# Patient Record
Sex: Female | Born: 1935
Health system: Southern US, Community
[De-identification: ages and names within clinical notes are randomized; demographics above are authoritative.]

## PROBLEM LIST (undated history)

## (undated) DIAGNOSIS — K219 Gastro-esophageal reflux disease without esophagitis: Secondary | ICD-10-CM

## (undated) DIAGNOSIS — M26629 Arthralgia of temporomandibular joint, unspecified side: Secondary | ICD-10-CM

## (undated) DIAGNOSIS — I10 Essential (primary) hypertension: Secondary | ICD-10-CM

## (undated) DIAGNOSIS — H269 Unspecified cataract: Secondary | ICD-10-CM

## (undated) DIAGNOSIS — C801 Malignant (primary) neoplasm, unspecified: Secondary | ICD-10-CM

## (undated) DIAGNOSIS — G8929 Other chronic pain: Secondary | ICD-10-CM

## (undated) DIAGNOSIS — Z923 Personal history of irradiation: Secondary | ICD-10-CM

## (undated) DIAGNOSIS — M1990 Unspecified osteoarthritis, unspecified site: Secondary | ICD-10-CM

## (undated) DIAGNOSIS — R51 Headache: Secondary | ICD-10-CM

## (undated) DIAGNOSIS — R519 Headache, unspecified: Secondary | ICD-10-CM

## (undated) DIAGNOSIS — H04123 Dry eye syndrome of bilateral lacrimal glands: Secondary | ICD-10-CM

## (undated) HISTORY — PX: CATARACT EXTRACTION: SUR2

---

## 2004-03-06 ENCOUNTER — Emergency Department (HOSPITAL_COMMUNITY): Admission: EM | Admit: 2004-03-06 | Discharge: 2004-03-06 | Payer: Self-pay | Admitting: Family Medicine

## 2004-04-23 ENCOUNTER — Emergency Department (HOSPITAL_COMMUNITY): Admission: EM | Admit: 2004-04-23 | Discharge: 2004-04-23 | Payer: Self-pay | Admitting: Family Medicine

## 2004-05-03 ENCOUNTER — Emergency Department (HOSPITAL_COMMUNITY): Admission: EM | Admit: 2004-05-03 | Discharge: 2004-05-03 | Payer: Self-pay | Admitting: *Deleted

## 2004-07-06 ENCOUNTER — Emergency Department (HOSPITAL_COMMUNITY): Admission: EM | Admit: 2004-07-06 | Discharge: 2004-07-06 | Payer: Self-pay | Admitting: Family Medicine

## 2004-09-26 ENCOUNTER — Emergency Department (HOSPITAL_COMMUNITY): Admission: EM | Admit: 2004-09-26 | Discharge: 2004-09-26 | Payer: Self-pay | Admitting: Family Medicine

## 2005-01-31 ENCOUNTER — Emergency Department (HOSPITAL_COMMUNITY): Admission: EM | Admit: 2005-01-31 | Discharge: 2005-01-31 | Payer: Self-pay | Admitting: Emergency Medicine

## 2005-02-08 ENCOUNTER — Emergency Department (HOSPITAL_COMMUNITY): Admission: EM | Admit: 2005-02-08 | Discharge: 2005-02-08 | Payer: Self-pay | Admitting: Emergency Medicine

## 2005-06-06 ENCOUNTER — Emergency Department (HOSPITAL_COMMUNITY): Admission: EM | Admit: 2005-06-06 | Discharge: 2005-06-06 | Payer: Self-pay | Admitting: Family Medicine

## 2005-09-28 ENCOUNTER — Encounter: Admission: RE | Admit: 2005-09-28 | Discharge: 2005-09-28 | Payer: Self-pay | Admitting: Internal Medicine

## 2005-10-11 ENCOUNTER — Encounter: Admission: RE | Admit: 2005-10-11 | Discharge: 2005-10-11 | Payer: Self-pay | Admitting: Internal Medicine

## 2005-11-03 ENCOUNTER — Other Ambulatory Visit: Admission: RE | Admit: 2005-11-03 | Discharge: 2005-11-03 | Payer: Self-pay | Admitting: Otolaryngology

## 2006-05-20 ENCOUNTER — Emergency Department (HOSPITAL_COMMUNITY): Admission: EM | Admit: 2006-05-20 | Discharge: 2006-05-20 | Payer: Self-pay | Admitting: Family Medicine

## 2006-06-05 ENCOUNTER — Emergency Department (HOSPITAL_COMMUNITY): Admission: EM | Admit: 2006-06-05 | Discharge: 2006-06-05 | Payer: Self-pay | Admitting: Family Medicine

## 2006-09-01 ENCOUNTER — Emergency Department (HOSPITAL_COMMUNITY): Admission: EM | Admit: 2006-09-01 | Discharge: 2006-09-01 | Payer: Self-pay | Admitting: Family Medicine

## 2006-10-03 ENCOUNTER — Encounter: Admission: RE | Admit: 2006-10-03 | Discharge: 2006-10-03 | Payer: Self-pay | Admitting: Internal Medicine

## 2007-02-19 ENCOUNTER — Emergency Department (HOSPITAL_COMMUNITY): Admission: EM | Admit: 2007-02-19 | Discharge: 2007-02-19 | Payer: Self-pay | Admitting: Family Medicine

## 2007-07-06 ENCOUNTER — Ambulatory Visit: Payer: Self-pay | Admitting: Internal Medicine

## 2007-07-29 ENCOUNTER — Emergency Department (HOSPITAL_COMMUNITY): Admission: EM | Admit: 2007-07-29 | Discharge: 2007-07-29 | Payer: Self-pay | Admitting: Emergency Medicine

## 2007-08-31 ENCOUNTER — Ambulatory Visit: Payer: Self-pay | Admitting: Internal Medicine

## 2007-09-14 ENCOUNTER — Ambulatory Visit: Payer: Self-pay | Admitting: Internal Medicine

## 2007-09-14 DIAGNOSIS — K5730 Diverticulosis of large intestine without perforation or abscess without bleeding: Secondary | ICD-10-CM | POA: Insufficient documentation

## 2007-09-14 DIAGNOSIS — K648 Other hemorrhoids: Secondary | ICD-10-CM | POA: Insufficient documentation

## 2007-10-04 DIAGNOSIS — M1990 Unspecified osteoarthritis, unspecified site: Secondary | ICD-10-CM | POA: Insufficient documentation

## 2007-10-20 ENCOUNTER — Emergency Department (HOSPITAL_COMMUNITY): Admission: EM | Admit: 2007-10-20 | Discharge: 2007-10-20 | Payer: Self-pay | Admitting: Family Medicine

## 2007-10-26 ENCOUNTER — Encounter: Admission: RE | Admit: 2007-10-26 | Discharge: 2007-10-26 | Payer: Self-pay | Admitting: Internal Medicine

## 2007-11-09 ENCOUNTER — Encounter: Admission: RE | Admit: 2007-11-09 | Discharge: 2007-11-09 | Payer: Self-pay | Admitting: Internal Medicine

## 2008-03-12 ENCOUNTER — Emergency Department (HOSPITAL_COMMUNITY): Admission: EM | Admit: 2008-03-12 | Discharge: 2008-03-13 | Payer: Self-pay | Admitting: Emergency Medicine

## 2008-11-07 ENCOUNTER — Encounter: Admission: RE | Admit: 2008-11-07 | Discharge: 2008-11-07 | Payer: Self-pay | Admitting: Internal Medicine

## 2008-12-22 ENCOUNTER — Emergency Department (HOSPITAL_COMMUNITY): Admission: EM | Admit: 2008-12-22 | Discharge: 2008-12-22 | Payer: Self-pay | Admitting: Emergency Medicine

## 2009-06-08 ENCOUNTER — Emergency Department (HOSPITAL_COMMUNITY): Admission: EM | Admit: 2009-06-08 | Discharge: 2009-06-08 | Payer: Self-pay | Admitting: Family Medicine

## 2009-09-09 ENCOUNTER — Emergency Department (HOSPITAL_COMMUNITY): Admission: EM | Admit: 2009-09-09 | Discharge: 2009-09-09 | Payer: Self-pay | Admitting: Emergency Medicine

## 2009-09-15 ENCOUNTER — Emergency Department (HOSPITAL_COMMUNITY): Admission: EM | Admit: 2009-09-15 | Discharge: 2009-09-15 | Payer: Self-pay | Admitting: Emergency Medicine

## 2009-10-02 ENCOUNTER — Encounter: Admission: RE | Admit: 2009-10-02 | Discharge: 2009-10-02 | Payer: Self-pay | Admitting: Internal Medicine

## 2009-10-02 ENCOUNTER — Emergency Department (HOSPITAL_COMMUNITY): Admission: EM | Admit: 2009-10-02 | Discharge: 2009-10-02 | Payer: Self-pay | Admitting: Emergency Medicine

## 2009-10-31 ENCOUNTER — Emergency Department (HOSPITAL_COMMUNITY): Admission: EM | Admit: 2009-10-31 | Discharge: 2009-11-01 | Payer: Self-pay | Admitting: Emergency Medicine

## 2009-11-14 ENCOUNTER — Emergency Department (HOSPITAL_COMMUNITY): Admission: EM | Admit: 2009-11-14 | Discharge: 2009-11-14 | Payer: Self-pay | Admitting: Family Medicine

## 2009-12-11 ENCOUNTER — Encounter: Admission: RE | Admit: 2009-12-11 | Discharge: 2009-12-11 | Payer: Self-pay | Admitting: Internal Medicine

## 2010-05-25 ENCOUNTER — Emergency Department (HOSPITAL_COMMUNITY): Admission: EM | Admit: 2010-05-25 | Discharge: 2010-05-25 | Payer: Self-pay | Admitting: Family Medicine

## 2010-07-26 ENCOUNTER — Emergency Department (HOSPITAL_COMMUNITY)
Admission: EM | Admit: 2010-07-26 | Discharge: 2010-07-26 | Payer: Self-pay | Source: Home / Self Care | Admitting: Emergency Medicine

## 2010-08-22 HISTORY — PX: BREAST BIOPSY: SHX20

## 2010-11-07 LAB — CBC
HCT: 38.8 % (ref 36.0–46.0)
Hemoglobin: 13.1 g/dL (ref 12.0–15.0)
RBC: 4.43 MIL/uL (ref 3.87–5.11)
RDW: 14.1 % (ref 11.5–15.5)
WBC: 4.5 10*3/uL (ref 4.0–10.5)

## 2010-11-07 LAB — COMPREHENSIVE METABOLIC PANEL
ALT: 16 U/L (ref 0–35)
Alkaline Phosphatase: 54 U/L (ref 39–117)
BUN: 17 mg/dL (ref 6–23)
CO2: 29 mEq/L (ref 19–32)
Chloride: 99 mEq/L (ref 96–112)
GFR calc non Af Amer: >60 mL/min (ref >60)
Glucose, Bld: 133 mg/dL — ABNORMAL HIGH (ref 70–99)
Potassium: 3.6 mEq/L (ref 3.5–5.1)
Sodium: 137 mEq/L (ref 135–145)
Total Bilirubin: 0.3 mg/dL (ref 0.3–1.2)

## 2010-11-07 LAB — DIFFERENTIAL
Basophils Absolute: 0 10*3/uL (ref 0.0–0.1)
Basophils Relative: 0 % (ref 0–1)
Eosinophils Absolute: 0 10*3/uL (ref 0.0–0.7)
Neutro Abs: 2.5 10*3/uL (ref 1.7–7.7)
Neutrophils Relative %: 56 % (ref 43–77)

## 2010-11-14 LAB — CBC
HCT: 38.3 % (ref 36.0–46.0)
Hemoglobin: 12.9 g/dL (ref 12.0–15.0)
Platelets: 267 10*3/uL (ref 150–400)
RDW: 15.7 % — ABNORMAL HIGH (ref 11.5–15.5)
WBC: 5.5 10*3/uL (ref 4.0–10.5)

## 2010-11-14 LAB — POCT URINALYSIS DIP (DEVICE)
Bilirubin Urine: NEGATIVE
Ketones, ur: NEGATIVE mg/dL
Nitrite: NEGATIVE
Protein, ur: NEGATIVE mg/dL
pH: 7 (ref 5.0–8.0)

## 2010-11-14 LAB — COMPREHENSIVE METABOLIC PANEL
ALT: 15 U/L (ref 0–35)
Albumin: 3.7 g/dL (ref 3.5–5.2)
Alkaline Phosphatase: 52 U/L (ref 39–117)
BUN: 11 mg/dL (ref 6–23)
Chloride: 106 mEq/L (ref 96–112)
Potassium: 3.8 mEq/L (ref 3.5–5.1)
Sodium: 138 mEq/L (ref 135–145)
Total Bilirubin: 0.6 mg/dL (ref 0.3–1.2)
Total Protein: 7.2 g/dL (ref 6.0–8.3)

## 2010-11-14 LAB — DIFFERENTIAL
Basophils Absolute: 0 10*3/uL (ref 0.0–0.1)
Basophils Relative: 0 % (ref 0–1)
Eosinophils Absolute: 0.1 10*3/uL (ref 0.0–0.7)
Eosinophils Relative: 2 % (ref 0–5)
Monocytes Absolute: 0.3 10*3/uL (ref 0.1–1.0)
Monocytes Relative: 6 % (ref 3–12)

## 2010-11-25 LAB — URINE CULTURE: Colony Count: 35000

## 2010-11-25 LAB — POCT URINALYSIS DIP (DEVICE)
Bilirubin Urine: NEGATIVE
Glucose, UA: NEGATIVE mg/dL
Ketones, ur: NEGATIVE mg/dL
Nitrite: NEGATIVE
Specific Gravity, Urine: 1.015 (ref 1.005–1.030)
pH: 5.5 (ref 5.0–8.0)

## 2010-12-01 ENCOUNTER — Ambulatory Visit (INDEPENDENT_AMBULATORY_CARE_PROVIDER_SITE_OTHER): Payer: Medicare Other

## 2010-12-01 ENCOUNTER — Inpatient Hospital Stay (INDEPENDENT_AMBULATORY_CARE_PROVIDER_SITE_OTHER)
Admission: RE | Admit: 2010-12-01 | Discharge: 2010-12-01 | Disposition: A | Discharge status: Home / Self Care | Payer: Medicare Other | Source: Ambulatory Visit | Attending: Emergency Medicine | Admitting: Emergency Medicine

## 2010-12-01 DIAGNOSIS — M1990 Unspecified osteoarthritis, unspecified site: Secondary | ICD-10-CM

## 2010-12-08 ENCOUNTER — Other Ambulatory Visit: Payer: Self-pay | Admitting: Internal Medicine

## 2010-12-08 DIAGNOSIS — Z1231 Encounter for screening mammogram for malignant neoplasm of breast: Secondary | ICD-10-CM

## 2010-12-15 ENCOUNTER — Ambulatory Visit
Admission: RE | Admit: 2010-12-15 | Discharge: 2010-12-15 | Disposition: A | Discharge status: Home / Self Care | Payer: MEDICARE | Source: Ambulatory Visit | Attending: Internal Medicine | Admitting: Internal Medicine

## 2010-12-15 ENCOUNTER — Other Ambulatory Visit: Payer: Self-pay | Admitting: Internal Medicine

## 2010-12-15 DIAGNOSIS — Z1231 Encounter for screening mammogram for malignant neoplasm of breast: Secondary | ICD-10-CM

## 2010-12-15 DIAGNOSIS — R928 Other abnormal and inconclusive findings on diagnostic imaging of breast: Secondary | ICD-10-CM

## 2010-12-22 ENCOUNTER — Ambulatory Visit
Admission: RE | Admit: 2010-12-22 | Discharge: 2010-12-22 | Disposition: A | Discharge status: Home / Self Care | Payer: MEDICARE | Source: Ambulatory Visit | Attending: Internal Medicine | Admitting: Internal Medicine

## 2010-12-22 ENCOUNTER — Other Ambulatory Visit: Payer: Self-pay | Admitting: Internal Medicine

## 2010-12-22 DIAGNOSIS — R928 Other abnormal and inconclusive findings on diagnostic imaging of breast: Secondary | ICD-10-CM

## 2010-12-30 ENCOUNTER — Ambulatory Visit
Admission: RE | Admit: 2010-12-30 | Discharge: 2010-12-30 | Disposition: A | Discharge status: Home / Self Care | Payer: MEDICARE | Source: Ambulatory Visit | Attending: Internal Medicine | Admitting: Internal Medicine

## 2010-12-30 ENCOUNTER — Other Ambulatory Visit: Payer: Self-pay | Admitting: Diagnostic Radiology

## 2010-12-30 DIAGNOSIS — R928 Other abnormal and inconclusive findings on diagnostic imaging of breast: Secondary | ICD-10-CM

## 2011-01-04 NOTE — Assessment & Plan Note (Signed)
[facility]                         GASTROENTEROLOGY OFFICE NOTE   NAME:Dawn Rodriguez, Dawn Rodriguez                        MRN:          [id number]  DATE:07/06/2007                            DOB:          March 02, 1936    CHIEF COMPLAINT:  Self-referred regarding bleeding.   HISTORY:  This is a 75 year old African-American woman (I performed a  colonoscopy on her brother) who started experiencing some changing bowel  habits a month or so ago.  She was having 2-3 days of hard, skivolous  balls of stool and then would have a normal bowel movement, then one day  a couple of weeks ago, she had clots of bright red blood and darker  blood in the commode.  That really only happened once.  Her bowel habits  are a little bit better but still changing back and forth between hard  and normal.  She has had some epigastric pain.  There has been no melena  reported.  She has had hemorrhoids for 40 years but says she has never  had any bleeding.  Her elderly mother had colon cancer diagnosed at 60,  had that removed surgically, but then died of pneumonia.  Patient  herself had a colonoscopy at [facility] in [place].  I see  photos she brings that showed diverticulosis and her hemorrhoids.  She  was told to have one every five years because of the family history.   MEDICATIONS:  No regular medications.  Motrin Arthritis and garlic  tablet p.r.n.   DRUG ALLERGIES:  None known.   PAST MEDICAL HISTORY:  Diverticulosis and osteoarthritis, otherwise  negative.   FAMILY HISTORY:  Notable for father having prostate cancer and renal  failure.  Her father also has a colostomy, for some reason.   SOCIAL HISTORY:  She is divorced.  She is a Building surveyor.  Two sons.  She has a Statistician.   Arthritis pain, some headaches, otherwise review of systems is negative.   PHYSICAL EXAMINATION:  A well-developed elderly black woman looking  younger than stated age.  Height 5 feet 6.  Weight 156 pounds.  Blood pressure 134/80.  Pulse 62.  Eyes are anicteric.  Mouth shows dentures.  NECK:  Supple.  CHEST:  Clear.  HEART:  S1 and S2.  No murmurs, rubs or gallops.  ABDOMEN:  Soft and nontender, no organomegaly or mass.  The lower extremities are free of edema.  She is alert and oriented x3.  LYMPH NODES:  No neck or supraclavicular nodes.   ASSESSMENT:  Change in bowel habits with blood in stool.  Family history  of colon cancer, although it was an elderly person.   PLAN:  1. Schedule colonoscopy and investigate.  2. Risks, benefits and indications are reviewed.  She understands and      agrees to proceed.  3. If this colonoscopy is unrevealing, would try fiber      supplementation, most likely.  She would probably need another      colonoscopy in about five years.  She is getting older, and it  would be reasonable, since her mother got it at an older age, to do      that, I suppose.  One could make an argument on going longer on the      intervals.  She is inclined to do it in five years, and I think      that is reasonable.     Iva Boop, MD,FACG  Electronically Signed    CEG/MedQ  DD: 07/06/2007  DT: 07/07/2007  Job #: [id number]   cc:   Merlene Laughter. Renae Gloss, M.D.

## 2011-03-02 ENCOUNTER — Inpatient Hospital Stay (INDEPENDENT_AMBULATORY_CARE_PROVIDER_SITE_OTHER)
Admission: RE | Admit: 2011-03-02 | Discharge: 2011-03-02 | Disposition: A | Discharge status: Home / Self Care | Payer: Medicare Other | Source: Ambulatory Visit | Attending: Family Medicine | Admitting: Family Medicine

## 2011-03-02 DIAGNOSIS — I10 Essential (primary) hypertension: Secondary | ICD-10-CM

## 2011-03-02 DIAGNOSIS — F411 Generalized anxiety disorder: Secondary | ICD-10-CM

## 2011-05-20 LAB — DIFFERENTIAL
Basophils Absolute: 0
Basophils Relative: 0
Eosinophils Absolute: 0.2
Eosinophils Relative: 3
Monocytes Absolute: 0.5
Neutro Abs: 4.5

## 2011-05-20 LAB — POCT I-STAT, CHEM 8
Calcium, Ion: 1.19
Chloride: 106
HCT: 36
Hemoglobin: 12.2
TCO2: 28

## 2011-05-20 LAB — POCT CARDIAC MARKERS: Operator id: 277751

## 2011-05-20 LAB — CBC
Hemoglobin: 12
MCHC: 33.2
MCV: 90.6
RDW: 15.1

## 2011-06-28 ENCOUNTER — Other Ambulatory Visit: Payer: Self-pay | Admitting: Internal Medicine

## 2011-06-28 DIAGNOSIS — R921 Mammographic calcification found on diagnostic imaging of breast: Secondary | ICD-10-CM

## 2011-07-12 ENCOUNTER — Ambulatory Visit
Admission: RE | Admit: 2011-07-12 | Discharge: 2011-07-12 | Disposition: A | Discharge status: Home / Self Care | Payer: Medicare Other | Source: Ambulatory Visit | Attending: Internal Medicine | Admitting: Internal Medicine

## 2011-07-12 DIAGNOSIS — R921 Mammographic calcification found on diagnostic imaging of breast: Secondary | ICD-10-CM

## 2011-10-30 ENCOUNTER — Encounter (HOSPITAL_COMMUNITY): Payer: Self-pay | Admitting: *Deleted

## 2011-10-30 ENCOUNTER — Emergency Department (HOSPITAL_COMMUNITY)
Admission: EM | Admit: 2011-10-30 | Discharge: 2011-10-30 | Disposition: A | Discharge status: Home / Self Care | Payer: Medicare Other | Source: Home / Self Care | Attending: Emergency Medicine | Admitting: Emergency Medicine

## 2011-10-30 DIAGNOSIS — N3000 Acute cystitis without hematuria: Secondary | ICD-10-CM

## 2011-10-30 HISTORY — DX: Essential (primary) hypertension: I10

## 2011-10-30 LAB — POCT URINALYSIS DIP (DEVICE)
Protein, ur: >=300 mg/dL — AB
Urobilinogen, UA: 1 mg/dL (ref 0.0–1.0)
pH: 5 (ref 5.0–8.0)

## 2011-10-30 MED ORDER — CEPHALEXIN 500 MG PO CAPS: 500.0000 mg | ORAL_CAPSULE | Freq: Three times a day (TID) | ORAL | Status: AC

## 2011-10-30 MED ORDER — ACETAMINOPHEN 500 MG PO TABS
1000.0000 mg | ORAL_TABLET | Freq: Four times a day (QID) | ORAL | Status: DC | PRN
  Administered 2011-10-30: 1000 mg via ORAL

## 2011-10-30 NOTE — ED Notes (Signed)
CO ABD PAIN WITH FREQUENT URINATION X 1 DAY

## 2011-10-30 NOTE — Discharge Instructions (Signed)

## 2011-10-30 NOTE — ED Provider Notes (Signed)
Chief Complaint  Patient presents with  . Urinary Frequency    History of Present Illness:   Dawn Rodriguez is 76 year old female who has had a one-day history of dysuria, frequency, lower back, and lower abdominal pain. She denies fever, chills, nausea, vomiting, hematuria, or GYN complaints. She has had urinary tract infections before, most recently about one year ago.  Review of Systems:  Other than noted above, the patient denies any of the following symptoms: General:  No fevers, chills, sweats, aches, or fatigue. GI:  No abdominal pain, back pain, nausea, vomiting, diarrhea, or constipation. GU:  No dysuria, frequency, urgency, hematuria, or incontinence. GYN:  No discharge, itching, vulvar pain or lesions, pelvic pain, or abnormal vaginal bleeding.  PMFSH:  Past medical history, family history, social history, meds, and allergies were reviewed.  Physical Exam:   Vital signs:  BP 137/76  Pulse 79  Temp(Src) 98.6 F (37 C) (Oral)  Resp 18  SpO2 98% Gen:  Alert, oriented, in no distress. Lungs:  Clear to auscultation, no wheezes, rales or rhonchi. Heart:  Regular rhythm, no gallop or murmer. Abdomen:  Flat and soft. There was slight suprapubic pain to palpation.  No guarding, or rebound.  No hepato-splenomegaly or mass.  Bowel sounds were normally active.  No hernia. Back:  No CVA tenderness.  Skin:  Clear, warm and dry.  Labs:   Results for orders placed during the hospital encounter of 10/30/11  POCT URINALYSIS DIP (DEVICE)      Component Value Range   Glucose, UA NEGATIVE  NEGATIVE (mg/dL)   Bilirubin Urine NEGATIVE  NEGATIVE    Ketones, ur TRACE (*) NEGATIVE (mg/dL)   Specific Gravity, Urine 1.015  1.005 - 1.030    Hgb urine dipstick LARGE (*) NEGATIVE    pH 5.0  5.0 - 8.0    Protein, ur >=300 (*) NEGATIVE (mg/dL)   Urobilinogen, UA 1.0  0.0 - 1.0 (mg/dL)   Nitrite POSITIVE (*) NEGATIVE    Leukocytes, UA MODERATE (*) NEGATIVE     A urine culture was  obtained.  Assessment:  Diagnoses that have been ruled out:  None  Diagnoses that are still under consideration:  None  Final diagnoses:  Acute cystitis     Plan:   1.  The following meds were prescribed:   New Prescriptions   CEPHALEXIN (KEFLEX) 500 MG CAPSULE    Take 1 capsule (500 mg total) by mouth 3 (three) times daily.   2.  The patient was instructed in symptomatic care and handouts were given. 3.  The patient was told to return if becoming worse in any way, if no better in 3 or 4 days, and given some red flag symptoms that would indicate earlier return. 4.  The patient was told to avoid intercourse for 10 days, get extra fluids, and return for a follow up with her primary care doctor at the completion of treatment for a repeat UA and culture.     Reuben Likes, MD 10/30/11 [phone]

## 2011-11-01 LAB — URINE CULTURE
Colony Count: >=100000
Culture  Setup Time: 201303110230
Special Requests: NORMAL

## 2011-11-02 NOTE — ED Notes (Signed)
Urine culture: >100,000 colonies E. Coli. Pt. adequately treated with Keflex. Vassie Moselle 11/02/2011

## 2012-04-15 ENCOUNTER — Emergency Department (INDEPENDENT_AMBULATORY_CARE_PROVIDER_SITE_OTHER)
Admission: EM | Admit: 2012-04-15 | Discharge: 2012-04-15 | Disposition: A | Discharge status: Home / Self Care | Payer: Medicare Other | Source: Home / Self Care | Attending: Emergency Medicine | Admitting: Emergency Medicine

## 2012-04-15 ENCOUNTER — Encounter (HOSPITAL_COMMUNITY): Payer: Self-pay | Admitting: *Deleted

## 2012-04-15 DIAGNOSIS — J029 Acute pharyngitis, unspecified: Secondary | ICD-10-CM

## 2012-04-15 LAB — POCT RAPID STREP A: Streptococcus, Group A Screen (Direct): NEGATIVE

## 2012-04-15 MED ORDER — PREDNISONE 5 MG PO KIT: 1.0000 | PACK | Freq: Every day | ORAL | Status: DC

## 2012-04-15 MED ORDER — FEXOFENADINE-PSEUDOEPHED ER 60-120 MG PO TB12: 1.0000 | ORAL_TABLET | Freq: Two times a day (BID) | ORAL | Status: DC

## 2012-04-15 MED ORDER — BENZONATATE 200 MG PO CAPS: 200.0000 mg | ORAL_CAPSULE | Freq: Three times a day (TID) | ORAL | Status: AC | PRN

## 2012-04-15 NOTE — ED Notes (Signed)
Pt with onset of sore throat/congestion Thursday - progressively worse - difficulty swallowing

## 2012-04-15 NOTE — ED Provider Notes (Signed)
Chief Complaint  Patient presents with  . Sore Throat  . Nasal Congestion    History of Present Illness:   Dawn Rodriguez is a 76 year old female with a four-day history of severe sore throat, postnasal drip, nasal congestion with clear rhinorrhea and a dry cough. She has not had fever, chills, sweats, headache, facial pain, neck pain, gland swelling, chest pain, wheezing, or shortness of breath. She denies any GI symptoms. 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  Review of Systems:  Other than as noted above, the patient denies any of the following symptoms. Systemic:  No fever, chills, sweats, fatigue, myalgias, headache, or anorexia. Eye:  No redness, pain or drainage. ENT:  No earache, ear congestion, nasal congestion, sneezing, rhinorrhea, sinus pressure, sinus pain, or post nasal drip. Lungs:  No cough, sputum production, wheezing, shortness of breath, or chest pain. GI:  No abdominal pain, nausea, vomiting, or diarrhea. Skin:  No rash or itching.  PMFSH:  Past medical history, family history, social history, meds, allergies, and nurse's notes were reviewed.  There is no known exposure to strep or mono.  No prior history of step or mono.  The patient denies use of tobacco.  Physical Exam:   Vital signs:  BP 150/71  Pulse 76  Temp 98.5 F (36.9 C) (Oral)  Resp 16  SpO2 98% General:  Alert, in no distress. Eye:  No conjunctival injection or drainage. Lids were normal. ENT:  TMs and canals were normal, without erythema or inflammation.  Nasal mucosa was clear and uncongested, without drainage.  Mucous membranes were moist.  Exam of pharynx shows erythema but no exudate or ulcerations.  There were no oral ulcerations or lesions. Neck:  Supple, no adenopathy, tenderness or mass. Lungs:  No respiratory distress.  Lungs were clear to auscultation, without wheezes, rales or rhonchi.  Breath sounds were clear and equal bilaterally.  Heart:   Regular rhythm, without gallops, murmers or rubs. Skin:  Clear, warm, and dry, without rash or lesions.  Labs:   Results for orders placed during the hospital encounter of 04/15/12  POCT RAPID STREP A ([facility] ONLY)      Component Value Range   Streptococcus, Group A Screen (Direct) NEGATIVE  NEGATIVE    Assessment:  The encounter diagnosis was Viral pharyngitis.  Plan:   1.  The following meds were prescribed:   New Prescriptions   BENZONATATE (TESSALON) 200 MG CAPSULE    Take 1 capsule (200 mg total) by mouth 3 (three) times daily as needed for cough.   FEXOFENADINE-PSEUDOEPHEDRINE (ALLEGRA-D) 60-120 MG PER TABLET    Take 1 tablet by mouth every 12 (twelve) hours.   PREDNISONE 5 MG KIT    Take 1 kit (5 mg total) by mouth daily after breakfast. Prednisone 5 mg 6 day dosepack.  Take as directed.   2.  The patient was instructed in symptomatic care including hot saline gargles, throat lozenges, infectious precautions, and need to trade out toothbrush. Handouts were given. 3.  The patient was told to return if becoming worse in any way, if no better in 3 or 4 days, and given some red flag symptoms that would indicate earlier return.    Reuben Likes, MD 04/15/12 [phone]

## 2012-05-22 ENCOUNTER — Other Ambulatory Visit: Payer: Self-pay | Admitting: Internal Medicine

## 2012-05-22 DIAGNOSIS — R921 Mammographic calcification found on diagnostic imaging of breast: Secondary | ICD-10-CM

## 2012-05-24 ENCOUNTER — Ambulatory Visit
Admission: RE | Admit: 2012-05-24 | Discharge: 2012-05-24 | Disposition: A | Discharge status: Home / Self Care | Payer: Medicare Other | Source: Ambulatory Visit | Attending: Internal Medicine | Admitting: Internal Medicine

## 2012-05-24 DIAGNOSIS — R921 Mammographic calcification found on diagnostic imaging of breast: Secondary | ICD-10-CM

## 2012-07-24 ENCOUNTER — Encounter: Payer: Self-pay | Admitting: Internal Medicine

## 2012-10-14 ENCOUNTER — Encounter (HOSPITAL_COMMUNITY): Payer: Self-pay | Admitting: Emergency Medicine

## 2012-10-14 ENCOUNTER — Emergency Department (HOSPITAL_COMMUNITY)
Admission: EM | Admit: 2012-10-14 | Discharge: 2012-10-14 | Disposition: A | Discharge status: Home / Self Care | Payer: Medicare Other | Attending: Emergency Medicine | Admitting: Emergency Medicine

## 2012-10-14 DIAGNOSIS — K219 Gastro-esophageal reflux disease without esophagitis: Secondary | ICD-10-CM | POA: Insufficient documentation

## 2012-10-14 DIAGNOSIS — Z79899 Other long term (current) drug therapy: Secondary | ICD-10-CM | POA: Insufficient documentation

## 2012-10-14 DIAGNOSIS — R252 Cramp and spasm: Secondary | ICD-10-CM | POA: Insufficient documentation

## 2012-10-14 DIAGNOSIS — I10 Essential (primary) hypertension: Secondary | ICD-10-CM | POA: Insufficient documentation

## 2012-10-14 DIAGNOSIS — H269 Unspecified cataract: Secondary | ICD-10-CM | POA: Insufficient documentation

## 2012-10-14 DIAGNOSIS — E876 Hypokalemia: Secondary | ICD-10-CM | POA: Insufficient documentation

## 2012-10-14 HISTORY — DX: Unspecified cataract: H26.9

## 2012-10-14 HISTORY — DX: Unspecified osteoarthritis, unspecified site: M19.90

## 2012-10-14 HISTORY — DX: Gastro-esophageal reflux disease without esophagitis: K21.9

## 2012-10-14 LAB — BASIC METABOLIC PANEL
BUN: 14 mg/dL (ref 6–23)
Calcium: 9.5 mg/dL (ref 8.4–10.5)
Creatinine, Ser: 0.74 mg/dL (ref 0.50–1.10)
GFR calc non Af Amer: 81 mL/min — ABNORMAL LOW (ref >90)
Glucose, Bld: 114 mg/dL — ABNORMAL HIGH (ref 70–99)

## 2012-10-14 MED ORDER — CYCLOBENZAPRINE HCL 10 MG PO TABS
5.0000 mg | ORAL_TABLET | Freq: Once | ORAL | Status: AC
  Administered 2012-10-14: 5 mg via ORAL
  Filled 2012-10-14: qty 1

## 2012-10-14 MED ORDER — CYCLOBENZAPRINE HCL 5 MG PO TABS: 5.0000 mg | ORAL_TABLET | Freq: Two times a day (BID) | ORAL | Status: DC | PRN

## 2012-10-14 NOTE — ED Provider Notes (Signed)
History     CSN: [id number]  Arrival date & time 10/14/12  0448   None     No chief complaint on file.   (Consider location/radiation/quality/duration/timing/severity/associated sxs/prior treatment) HPI Dawn Rodriguez is a 77 y.o. female with a history of hypertension who is currently taking amlodipine who presents with bilateral leg cramps right worse than left. This started in [employer] last night, she says she stretched the right-sided cramp out, walked around and it felt better. She says she went to bed last night and then woke up about 4:00 in the problem reoccurred when she moved her right foot. She says she has a stair stepper in the room and she used that to work the cramp out, this alleviated the pain with some gentle massage. Patient then thought she should go to the hospital for evaluation. Patient has no history of venous thromboembolic disease, is not taking any exogenous estrogens, has no history of long bone fractures, no history of varicose veins, no redness in either leg. Cramps are not currently present, her right calf is mildly tender to palpation. When the pain is present it is severe, cramping located at the right calf and does radiate to the foot.   patient says she has a bad reaction to cholesterol medication and so is taking red yeast rice extract-she just recently started taking this supplement again and took 2 pills yesterday prior to this incident.   Past Medical History  Diagnosis Date  . Hypertension     No past surgical history on file.  No family history on file.  History  Substance Use Topics  . Smoking status: Never Smoker   . Smokeless tobacco: Not on file  . Alcohol Use: No    OB History   Grav Para Term Preterm Abortions TAB SAB Ect Mult Living                  Review of Systems At least 10pt or greater review of systems completed and are negative except where specified in the HPI.  Allergies  Other  Home Medications   Current  Outpatient Rx  Name  Route  Sig  Dispense  Refill  . ALPRAZolam (XANAX) 0.25 MG tablet   Oral   Take 0.25 mg by mouth at bedtime as needed.         Marland Kitchen amLODipine-benazepril (LOTREL) 10-20 MG per capsule   Oral   Take 1 capsule by mouth daily.         . cholecalciferol (VITAMIN D) 1000 UNITS tablet   Oral   Take 1,000 Units by mouth daily.         . fexofenadine-pseudoephedrine (ALLEGRA-D) 60-120 MG per tablet   Oral   Take 1 tablet by mouth every 12 (twelve) hours.   30 tablet   0   . Glycerin-Polysorbate 80 (REFRESH DRY EYE THERAPY OP)   Ophthalmic   Apply to eye.         . Multiple Vitamins-Minerals (MULTIVITAMIN WITH MINERALS) tablet   Oral   Take 1 tablet by mouth daily.         . PredniSONE 5 MG KIT   Oral   Take 1 kit (5 mg total) by mouth daily after breakfast. Prednisone 5 mg 6 day dosepack.  Take as directed.   1 kit   0   . Travoprost, BAK Free, (TRAVATAN) 0.004 % SOLN ophthalmic solution      1 drop at bedtime.  There were no vitals taken for this visit.  Physical Exam  Nursing notes reviewed.  Electronic medical record reviewed. VITAL SIGNS:   Filed Vitals:   10/14/12 0456 10/14/12 0500 10/14/12 0600 10/14/12 0700  BP: 148/61 146/69 136/61 123/45  Pulse:  79 71 60  Temp: 98.3 F (36.8 C)     TempSrc: Oral     Resp: 20 19 16 17   SpO2: 100% 100% 99% 98%   CONSTITUTIONAL: Awake, oriented, appears non-toxic HENT: Atraumatic, normocephalic, oral mucosa pink and moist, airway patent. Nares patent without drainage. External ears normal. EYES: Conjunctiva clear, EOMI, PERRLA NECK: Trachea midline, non-tender, supple CARDIOVASCULAR: Normal heart rate, Normal rhythm, No murmurs, rubs, gallops PULMONARY/CHEST: Clear to auscultation, no rhonchi, wheezes, or rales. Symmetrical breath sounds. Non-tender. ABDOMINAL: Non-distended, soft, non-tender - no rebound or guarding.  BS normal. NEUROLOGIC: Non-focal, moving all four extremities,  no gross sensory or motor deficits. EXTREMITIES: No clubbing, cyanosis, or edema. Mild tenderness to palpation of the right calf, no cords palpated. SKIN: Warm, Dry, No erythema, No rash   ED Course  Procedures (including critical care time)  Labs Reviewed  BASIC METABOLIC PANEL - Abnormal; Notable for the following:    Potassium 3.2 (*)    Glucose, Bld 114 (*)    GFR calc non Af Amer 81 (*)    All other components within normal limits  MAGNESIUM   No results found.   1. Leg cramps       MDM  Dawn Rodriguez is a 77 y.o. female who presents with leg cramps - this occurred once yesterday and [employer] and has reoccurred overnight. He is most likely that she suffered a charley horse and that her muscle has been irritable.  Checked electrolytes which are unremarkable-patient has a slightly low potassium of 3.2, patient does already take occasional potassium supplements. Patient's GFR is adequate for this, she may continue to take her supplements as needed.  I do not think her red disease right extract is causing this-I would expect widespread myalgias if she was that sensitive to HMG co-reductase inhibitors. Patient is low risk for venous thromboembolic disease-I do not think a d-dimer or ultrasounds are required, her history is not consistent with deep vein thrombosis of the calf.  Discussed conservative measures with the patient, she actually received pretty good relief with one 5 mg Flexeril tablet, she can use these as needed for intermittent cramps, as well as rest, compression, and some heat.  I explained the diagnosis and have given explicit precautions to return to the ER including any other new or worsening symptoms. The patient understands and accepts the medical plan as it's been dictated and I have answered their questions. Discharge instructions concerning home care and prescriptions have been given.  The patient is STABLE and is discharged to home in good  condition.         Jones Skene, MD 10/14/12 [phone]

## 2012-10-14 NOTE — ED Notes (Signed)
Patient complaining of bilateral cramps in both legs while shopping in [employer] last night.  Patient reports that she used to get leg cramps frequently as a teenager.  Patient reports that she "walked off" the leg cramps; patient reports that she felt better.  Patient reports that when she got home, she sat down, and was able to get up.  Patient fell asleep; got up around 0400, and states that the pain returned upon waking up.  Patient reports that she used her "stair stepper" at work, which helped with the cramps and allowed her to drive to the hospital.  Upon arrival to room, patient connected to continuous cardiac, pulse ox, and blood pressure monitor.  Will continue to monitor.

## 2013-01-13 ENCOUNTER — Encounter (HOSPITAL_COMMUNITY): Payer: Self-pay | Admitting: Emergency Medicine

## 2013-01-13 ENCOUNTER — Emergency Department (HOSPITAL_COMMUNITY)
Admission: EM | Admit: 2013-01-13 | Discharge: 2013-01-13 | Disposition: A | Discharge status: Home / Self Care | Payer: Medicare Other | Attending: Emergency Medicine | Admitting: Emergency Medicine

## 2013-01-13 DIAGNOSIS — I10 Essential (primary) hypertension: Secondary | ICD-10-CM | POA: Insufficient documentation

## 2013-01-13 DIAGNOSIS — H1130 Conjunctival hemorrhage, unspecified eye: Secondary | ICD-10-CM | POA: Insufficient documentation

## 2013-01-13 DIAGNOSIS — Z7982 Long term (current) use of aspirin: Secondary | ICD-10-CM | POA: Insufficient documentation

## 2013-01-13 DIAGNOSIS — Z8739 Personal history of other diseases of the musculoskeletal system and connective tissue: Secondary | ICD-10-CM | POA: Insufficient documentation

## 2013-01-13 DIAGNOSIS — H1132 Conjunctival hemorrhage, left eye: Secondary | ICD-10-CM

## 2013-01-13 DIAGNOSIS — Z8719 Personal history of other diseases of the digestive system: Secondary | ICD-10-CM | POA: Insufficient documentation

## 2013-01-13 DIAGNOSIS — Z8669 Personal history of other diseases of the nervous system and sense organs: Secondary | ICD-10-CM | POA: Insufficient documentation

## 2013-01-13 NOTE — ED Notes (Signed)
PT. REPORTS BLOOD AT LEFT SCLERA ONSET THIS MORNING , DENIES INJURY , NO BLURRED VISION , STATES HISTORY OF CATARACT , SLIGHT PAIN AT LOWER EYELID.

## 2013-01-13 NOTE — ED Provider Notes (Signed)
History     CSN: [id number]  Arrival date & time 01/13/13  0010   First MD Initiated Contact with Patient 01/13/13 0013      Chief Complaint  Patient presents with  . Eye Problem    (Consider location/radiation/quality/duration/timing/severity/associated sxs/prior treatment) HPI Comments: Patient presents with a small subconjunctival hemorrhage to the left eye.  She is, concerned at this visit has grown slightly over the last 10 hours.  Denies any blurry vision.  Cannot remember specific trauma, but she does, state that her eyes are itchy she states, that she had cataract surgery about one year ago.  Bilaterally.  Does have a history of, dry, and use his drops regularly.  Has an appointment back Saturday, with her ophthalmologist  Patient is a 77 y.o. female presenting with eye problem. The history is provided by the patient.  Eye Problem Location:  L eye Severity:  Mild Onset quality:  Gradual Duration:  12 hours Timing:  Constant Progression:  Worsening Chronicity:  New Relieved by:  Nothing Worsened by:  Nothing tried Associated symptoms: itching and redness   Associated symptoms: no blurred vision, no decreased vision, no headaches, no inflammation, no photophobia, no swelling and no tearing     Past Medical History  Diagnosis Date  . Hypertension   . Cataracts, bilateral   . Arthritis   . GERD (gastroesophageal reflux disease)     Past Surgical History  Procedure Laterality Date  . Cataract extraction      No family history on file.  History  Substance Use Topics  . Smoking status: Never Smoker   . Smokeless tobacco: Not on file  . Alcohol Use: No    OB History   Grav Para Term Preterm Abortions TAB SAB Ect Mult Living                  Review of Systems  Constitutional: Negative for fever and chills.  Eyes: Positive for redness and itching. Negative for blurred vision, photophobia, pain and visual disturbance.  Neurological: Negative for dizziness  and headaches.  All other systems reviewed and are negative.    Allergies  Tobramycin  Home Medications   Current Outpatient Rx  Name  Route  Sig  Dispense  Refill  . amLODipine (NORVASC) 2.5 MG tablet   Oral   Take 2.5 mg by mouth daily.         Marland Kitchen aspirin 81 MG chewable tablet   Oral   Chew 81 mg by mouth once as needed for pain (for chest pain).         . cholecalciferol (VITAMIN D) 1000 UNITS tablet   Oral   Take 1,000 Units by mouth daily.         . cyclobenzaprine (FLEXERIL) 5 MG tablet   Oral   Take 1 tablet (5 mg total) by mouth 2 (two) times daily as needed for muscle spasms.   20 tablet   0   . GARLIC PO   Oral   Take 1 tablet by mouth daily.         . Glucosamine-Chondroitin (ARTHX SS PO)   Oral   Take 2 tablets by mouth daily.         . Glycerin-Polysorbate 80 (REFRESH DRY EYE THERAPY OP)   Ophthalmic   Apply to eye.         Marland Kitchen POTASSIUM GLUCONATE PO   Oral   Take 2 tablets by mouth 2 (two) times daily.         Marland Kitchen  Red Yeast Rice Extract (RED YEAST RICE PO)   Oral   Take 1 tablet by mouth daily.         . travoprost, benzalkonium, (TRAVATAN) 0.004 % ophthalmic solution   Both Eyes   Place 1 drop into both eyes at bedtime.         . Vitamins C E (VITAMIN C & E COMBINATION PO)   Oral   Take 1 tablet by mouth daily.           BP 158/61  Pulse 85  Temp(Src) 98.3 F (36.8 C) (Oral)  Resp 16  SpO2 98%  Physical Exam  Nursing note and vitals reviewed. Constitutional: She appears well-nourished.  HENT:  Head: Normocephalic.  Eyes: Pupils are equal, round, and reactive to light. Left conjunctiva has a hemorrhage. Left eye exhibits no nystagmus.  Neck: Normal range of motion.  Cardiovascular: Normal rate.   Pulmonary/Chest: Effort normal.  Musculoskeletal: Normal range of motion.  Neurological: She is alert.  Skin: Skin is warm.    ED Course  Procedures (including critical care time)  Labs Reviewed - No data to  display No results found.   1. Subconjunctival hemorrhage, left       MDM           Arman Filter, NP 01/13/13 0023

## 2013-01-14 NOTE — ED Provider Notes (Signed)
Medical screening examination/treatment/procedure(s) were performed by non-physician practitioner and as supervising physician I was immediately available for consultation/collaboration.  Shakemia Madera M Jamese Trauger, MD 01/14/13 0402 

## 2013-04-19 ENCOUNTER — Encounter: Payer: Self-pay | Admitting: Internal Medicine

## 2013-04-23 ENCOUNTER — Telehealth: Payer: Self-pay | Admitting: Internal Medicine

## 2013-04-23 NOTE — Telephone Encounter (Signed)
Patient just wanted to update her records with our office

## 2013-05-10 ENCOUNTER — Encounter (HOSPITAL_COMMUNITY): Payer: Self-pay | Admitting: Emergency Medicine

## 2013-05-10 ENCOUNTER — Emergency Department (HOSPITAL_COMMUNITY)
Admission: EM | Admit: 2013-05-10 | Discharge: 2013-05-11 | Disposition: A | Discharge status: Home / Self Care | Payer: Medicare Other | Attending: Emergency Medicine | Admitting: Emergency Medicine

## 2013-05-10 DIAGNOSIS — Z8739 Personal history of other diseases of the musculoskeletal system and connective tissue: Secondary | ICD-10-CM | POA: Insufficient documentation

## 2013-05-10 DIAGNOSIS — I10 Essential (primary) hypertension: Secondary | ICD-10-CM | POA: Insufficient documentation

## 2013-05-10 DIAGNOSIS — J3489 Other specified disorders of nose and nasal sinuses: Secondary | ICD-10-CM | POA: Insufficient documentation

## 2013-05-10 DIAGNOSIS — Z8719 Personal history of other diseases of the digestive system: Secondary | ICD-10-CM | POA: Insufficient documentation

## 2013-05-10 DIAGNOSIS — R519 Headache, unspecified: Secondary | ICD-10-CM

## 2013-05-10 DIAGNOSIS — Z8669 Personal history of other diseases of the nervous system and sense organs: Secondary | ICD-10-CM | POA: Insufficient documentation

## 2013-05-10 DIAGNOSIS — Z79899 Other long term (current) drug therapy: Secondary | ICD-10-CM | POA: Insufficient documentation

## 2013-05-10 DIAGNOSIS — R51 Headache: Secondary | ICD-10-CM | POA: Insufficient documentation

## 2013-05-10 HISTORY — DX: Dry eye syndrome of bilateral lacrimal glands: H04.123

## 2013-05-10 NOTE — ED Notes (Signed)
Pt. reports intermittent headache for 1 month worse at night when lying diagnosed with sinusitis 2 weeks ago prescribed with Amoxicillin and nasal spray - symptoms recurred for the past several days .

## 2013-05-11 ENCOUNTER — Encounter (HOSPITAL_COMMUNITY): Payer: Self-pay | Admitting: Emergency Medicine

## 2013-05-11 ENCOUNTER — Emergency Department (HOSPITAL_COMMUNITY): Payer: Medicare Other

## 2013-05-11 MED ORDER — IBUPROFEN 800 MG PO TABS: 800.0000 mg | ORAL_TABLET | Freq: Three times a day (TID) | ORAL | Status: DC | PRN

## 2013-05-11 MED ORDER — TRIAMCINOLONE ACETONIDE(NASAL) 55 MCG/ACT NA INHA: 2.0000 | Freq: Every day | NASAL | Status: DC

## 2013-05-11 MED ORDER — GUAIFENESIN ER 1200 MG PO TB12: 1.0000 | ORAL_TABLET | Freq: Two times a day (BID) | ORAL | Status: DC

## 2013-05-11 MED ORDER — IBUPROFEN 800 MG PO TABS
800.0000 mg | ORAL_TABLET | Freq: Once | ORAL | Status: AC
  Administered 2013-05-11: 800 mg via ORAL
  Filled 2013-05-11: qty 1

## 2013-05-11 NOTE — ED Provider Notes (Signed)
CSN: [id number]     Arrival date & time 05/10/13  2034 History   First MD Initiated Contact with Patient 05/11/13 0006     Chief Complaint  Patient presents with  . Headache   (Consider location/radiation/quality/duration/timing/severity/associated sxs/prior Treatment) HPI 59 YOF presents to the emergency department with a headache. 1 month ago she had similar headaches that would come and go; she was diagnosed with sinusitis and treated with Amoxicillin, steroid injection, and nasal spray. She notes she completed her course of abx and her headache resolved. One week ago she started noticing a headache when she laid down or at night. Today her headache started around 1 PM in the frontal area radiating to her ear; pain is 8/10 ache. Pain is worse in left than right. She tried alkaseltzer plus which brought her pain down to a 5/10. Nothing else makes the pain better; laying down makes the pain worse. Denies history of migraine, vision changes, photophobia, phonophobia, dizziness, congestion, dyspnea, congestion, cough, fever, chills, neck pain, abdominal pain, nausea, constipation, diarrhea, vomiting, weakness, sensory changes, loss of consciousness. Past Medical History  Diagnosis Date  . Hypertension   . Cataracts, bilateral   . Arthritis   . GERD (gastroesophageal reflux disease)    Past Surgical History  Procedure Laterality Date  . Cataract extraction     No family history on file. History  Substance Use Topics  . Smoking status: Never Smoker   . Smokeless tobacco: Not on file  . Alcohol Use: No   OB History   Grav Para Term Preterm Abortions TAB SAB Ect Mult Living                 Review of Systems All other systems negative except as documented in the HPI. All pertinent positives and negatives as reviewed in the HPI.  Allergies  Tobramycin  Home Medications   Current Outpatient Rx  Name  Route  Sig  Dispense  Refill  . amLODipine (NORVASC) 2.5 MG tablet   Oral   Take  2.5 mg by mouth daily.         . Ascorbic Acid (VITAMIN C) 1000 MG tablet   Oral   Take 1,000 mg by mouth daily.         . Bilberry 1000 MG CAPS   Oral   Take 1 capsule by mouth daily.         . cholecalciferol (VITAMIN D) 1000 UNITS tablet   Oral   Take 1,000 Units by mouth daily.         . COD LIVER OIL PO   Oral   Take 1 capsule by mouth daily.         . Garlic 1000 MG CAPS   Oral   Take 1 capsule by mouth daily.         . Glucosamine-Chondroitin (ARTHX SS PO)   Oral   Take 2 tablets by mouth daily.         . Glycerin-Polysorbate 80 (REFRESH DRY EYE THERAPY OP)   Both Eyes   Place 1-2 drops into both eyes 4 (four) times daily as needed (for dry eyes).          Marland Kitchen KRILL OIL OMEGA-3 PO   Oral   Take 1 capsule by mouth daily.         . Multiple Vitamins-Minerals (ZINC PO)   Oral   Take 1 tablet by mouth daily.         . potassium gluconate  595 MG TABS   Oral   Take 595 mg by mouth daily.         . vitamin B-12 (CYANOCOBALAMIN) 1000 MCG tablet   Oral   Take 1,000 mcg by mouth daily.         . vitamin E (VITAMIN E) 400 UNIT capsule   Oral   Take 400 Units by mouth daily.          BP 163/80  Pulse 91  Temp(Src) 98.4 F (36.9 C) (Oral)  Resp 18  Ht 5\' 6"  (1.676 m)  Wt 145 lb (65.772 kg)  BMI 23.41 kg/m2  SpO2 97% Physical Exam  Nursing note and vitals reviewed. Constitutional: She is oriented to person, place, and time. Vital signs are normal. She appears well-developed and well-nourished.  Non-toxic appearance. No distress.  HENT:  Head: Normocephalic and atraumatic.  Right Ear: Tympanic membrane, external ear and ear canal normal.  Left Ear: External ear and ear canal normal.  Nose: Nose normal. Right sinus exhibits no maxillary sinus tenderness and no frontal sinus tenderness. Left sinus exhibits no maxillary sinus tenderness and no frontal sinus tenderness.  Unable to visualize left ear due to earwax occluding the view.  Right ear only partially occluded with earwax.  No tenderness over temporal arteries.  Eyes: Conjunctivae are normal. Pupils are equal, round, and reactive to light.  Neck: Trachea normal and normal range of motion. Neck supple.  Cardiovascular: Normal rate, regular rhythm, normal heart sounds, intact distal pulses and normal pulses.   Pulmonary/Chest: Effort normal and breath sounds normal. No accessory muscle usage. No respiratory distress. She has no decreased breath sounds.  Abdominal: Soft. Normal appearance and bowel sounds are normal. She exhibits no distension. There is no tenderness.  Lymphadenopathy:       Head (right side): No submental, no submandibular, no tonsillar, no preauricular and no posterior auricular adenopathy present.       Head (left side): No submental, no submandibular, no tonsillar, no preauricular and no posterior auricular adenopathy present.  Neurological: She is alert and oriented to person, place, and time. She has normal strength. No cranial nerve deficit or sensory deficit. Coordination normal.  Skin: Skin is warm and dry.  Psychiatric: She has a normal mood and affect. Her behavior is normal. Judgment and thought content normal.    ED Course  Procedures (including critical care time) Patient is not having neurological deficits noted on exam.  This most likely is potential residual sinus issue.  She is told to follow up with her primary care Dr. told to return here for any worsening in her condition.  Patient's headache was of gradual onset.  She does not have any visual changes, dizziness, blurred vision, weakness, or numbness.  Patient is a fairly well-appearing individual, for her stated age  MDM  MDM Reviewed: nursing note and vitals Interpretation: CT scan        Carlyle Dolly, PA-C 05/11/13 0134

## 2013-05-11 NOTE — ED Notes (Signed)
PA at bedside.

## 2013-05-11 NOTE — ED Notes (Signed)
Pt reports having h/a x1 week. Pt states the pain has been constant since 1pm yesterday. Pt describes the pain is in the left side of her head and her ears are pulsating. Pt describes the pain as dull throbbing and rates it 8/10. Pt denies radiation of pain. Denies blurred vision, denies light headedness, denies photophobia. Pt was recently prescribed antibiotics for her headache because of "sinus pain." Pt states her pain went away with the antibiotics, but has now returned.

## 2013-05-11 NOTE — ED Notes (Signed)
Pt has ride home.

## 2013-05-13 NOTE — ED Provider Notes (Signed)
Medical screening examination/treatment/procedure(s) were performed by non-physician practitioner and as supervising physician I was immediately available for consultation/collaboration.   Denorris Reust M Damean Poffenberger, DO 05/13/13 1235 

## 2013-07-16 ENCOUNTER — Emergency Department (HOSPITAL_COMMUNITY)
Admission: EM | Admit: 2013-07-16 | Discharge: 2013-07-16 | Disposition: A | Discharge status: Home / Self Care | Payer: Medicare Other | Source: Home / Self Care

## 2013-07-16 ENCOUNTER — Encounter (HOSPITAL_COMMUNITY): Payer: Self-pay | Admitting: Emergency Medicine

## 2013-07-16 DIAGNOSIS — M26629 Arthralgia of temporomandibular joint, unspecified side: Secondary | ICD-10-CM

## 2013-07-16 DIAGNOSIS — M2669 Other specified disorders of temporomandibular joint: Secondary | ICD-10-CM

## 2013-07-16 DIAGNOSIS — M129 Arthropathy, unspecified: Secondary | ICD-10-CM

## 2013-07-16 DIAGNOSIS — M1990 Unspecified osteoarthritis, unspecified site: Secondary | ICD-10-CM

## 2013-07-16 NOTE — ED Provider Notes (Signed)
CSN: [id number]     Arrival date & time 07/16/13  0818 History   None    Chief Complaint  Patient presents with  . Numbness   (Consider location/radiation/quality/duration/timing/severity/associated sxs/prior Treatment) HPI Comments: 77 year old female was a former patient of mine at [facility] presents with an episode of left shoulder/facial pain this morning and achiness in both arms. There was temporary numbness to the office. She states she had an episode this morning however the symptoms are chronic intermittent starting several months to well over a year ago. There is no difference in the symptoms. She has been evaluated previously for these symptoms and diagnosed with arthralgia/arthritis. The pain in the left face is intermittent, she does not have symptoms during this time. There is no facial tenderness. She denies any known focal neurologic deficits. Denies problems with vision, speech, hearing, swallowing or focal weakness. The patient admits that she is under some stress and it is possible that she is unknowingly clenching her jaw.   Past Medical History  Diagnosis Date  . Hypertension   . Cataracts, bilateral   . Arthritis   . GERD (gastroesophageal reflux disease)   . Bilateral dry eyes    Past Surgical History  Procedure Laterality Date  . Cataract extraction     History reviewed. No pertinent family history. History  Substance Use Topics  . Smoking status: Never Smoker   . Smokeless tobacco: Not on file  . Alcohol Use: No   OB History   Grav Para Term Preterm Abortions TAB SAB Ect Mult Living                 Review of Systems  Constitutional: Negative for fever, chills and activity change.  HENT: Negative.  Negative for dental problem, ear pain, mouth sores, nosebleeds, postnasal drip, rhinorrhea, sinus pressure, sneezing and sore throat.   Respiratory: Negative.   Cardiovascular: Negative.   Musculoskeletal: Positive for arthralgias. Negative for joint swelling,  myalgias, neck pain and neck stiffness.       As per HPI  Skin: Negative for color change, pallor and rash.  Neurological: Positive for numbness. Negative for dizziness, tremors, seizures, syncope, facial asymmetry, speech difficulty, weakness, light-headedness and headaches.    Allergies  Tobramycin  Home Medications   Current Outpatient Rx  Name  Route  Sig  Dispense  Refill  . amLODipine (NORVASC) 2.5 MG tablet   Oral   Take 2.5 mg by mouth daily.         . Ascorbic Acid (VITAMIN C) 1000 MG tablet   Oral   Take 1,000 mg by mouth daily.         . cholecalciferol (VITAMIN D) 1000 UNITS tablet   Oral   Take 1,000 Units by mouth daily.         . COD LIVER OIL PO   Oral   Take 1 capsule by mouth daily.         . Garlic 1000 MG CAPS   Oral   Take 1 capsule by mouth daily.         . Glucosamine-Chondroitin (ARTHX SS PO)   Oral   Take 2 tablets by mouth daily.         . Glycerin-Polysorbate 80 (REFRESH DRY EYE THERAPY OP)   Both Eyes   Place 1-2 drops into both eyes 4 (four) times daily as needed (for dry eyes).          . Guaifenesin 1200 MG TB12   Oral  Take 1 tablet (1,200 mg total) by mouth 2 (two) times daily.   20 each   0   . KRILL OIL OMEGA-3 PO   Oral   Take 1 capsule by mouth daily.         . Multiple Vitamins-Minerals (ZINC PO)   Oral   Take 1 tablet by mouth daily.         . potassium gluconate 595 MG TABS   Oral   Take 595 mg by mouth daily.         . vitamin B-12 (CYANOCOBALAMIN) 1000 MCG tablet   Oral   Take 1,000 mcg by mouth daily.         . vitamin E (VITAMIN E) 400 UNIT capsule   Oral   Take 400 Units by mouth daily.         . Bilberry 1000 MG CAPS   Oral   Take 1 capsule by mouth daily.         Marland Kitchen ibuprofen (ADVIL,MOTRIN) 800 MG tablet   Oral   Take 1 tablet (800 mg total) by mouth every 8 (eight) hours as needed for pain.   21 tablet   0   . triamcinolone (NASACORT) 55 MCG/ACT nasal inhaler    Nasal   Place 2 sprays into the nose daily.   1 Inhaler   12    BP 152/77  Pulse 68  Temp(Src) 98 F (36.7 C) (Oral)  Resp 18  SpO2 99% Physical Exam  Nursing note and vitals reviewed. Constitutional: She is oriented to person, place, and time. She appears well-developed and well-nourished. No distress.  HENT:  Right Ear: External ear normal.  Left Ear: External ear normal.  Nose: Nose normal.  Mouth/Throat: Oropharynx is clear and moist. No oropharyngeal exudate.  No external tenderness of the TMJ however the left pterygoid is markedly tender. The right is asymptomatic.  Eyes: Conjunctivae and EOM are normal. Pupils are equal, round, and reactive to light.  Neck: Normal range of motion. Neck supple.  Cardiovascular: Normal rate, regular rhythm and normal heart sounds.   Pulmonary/Chest: Effort normal and breath sounds normal. No respiratory distress. She has no wheezes.  Abdominal: Soft. There is no tenderness.  Musculoskeletal: Normal range of motion. She exhibits no edema and no tenderness.  Lymphadenopathy:    She has no cervical adenopathy.  Neurological: She is alert and oriented to person, place, and time. She displays no atrophy and no tremor. No cranial nerve deficit or sensory deficit. She exhibits normal muscle tone. Coordination and gait normal. GCS eye subscore is 4. GCS verbal subscore is 5. GCS motor subscore is 6.  Skin: Skin is warm and dry.  Psychiatric: She has a normal mood and affect.    ED Course  Procedures (including critical care time) Labs Review Labs Reviewed - No data to display Imaging Review No results found.    MDM   1. Temporomandibular joint-pain-dysfunction syndrome (TMJ)   2. Arthritis      Patient is discharged in good condition. There are no signs of neurologic dysfunction. She has had chronic intermittent facial pain and arthritis pains in the arms and legs for several months to years. She is to followup with her PCP as needed.  She may continue to take ibuprofen for pain may try ice over the mandible/jaw and may alternate with heat. Patient was shown how to slide the mandible forward to stretch the muscle and limit clenching of the jaw.  Hayden Rasmussen, NP  07/16/13 0909 

## 2013-07-16 NOTE — ED Notes (Signed)
Provider in before nurse.  Pt triaged and assessed by Provider.

## 2013-07-16 NOTE — ED Provider Notes (Signed)
Medical screening examination/treatment/procedure(s) were performed by non-physician practitioner and as supervising physician I was immediately available for consultation/collaboration.  Leslee Home, M.D.  Reuben Likes, MD 07/16/13 [phone]

## 2013-10-31 ENCOUNTER — Other Ambulatory Visit: Payer: Self-pay

## 2013-10-31 DIAGNOSIS — Z1231 Encounter for screening mammogram for malignant neoplasm of breast: Secondary | ICD-10-CM

## 2013-11-12 ENCOUNTER — Ambulatory Visit
Admission: RE | Admit: 2013-11-12 | Discharge: 2013-11-12 | Disposition: A | Discharge status: Home / Self Care | Payer: Medicare Other | Source: Ambulatory Visit

## 2013-11-12 DIAGNOSIS — Z1231 Encounter for screening mammogram for malignant neoplasm of breast: Secondary | ICD-10-CM

## 2013-12-12 ENCOUNTER — Emergency Department (INDEPENDENT_AMBULATORY_CARE_PROVIDER_SITE_OTHER): Payer: Medicare Other

## 2013-12-12 ENCOUNTER — Encounter (HOSPITAL_COMMUNITY): Payer: Self-pay | Admitting: Emergency Medicine

## 2013-12-12 ENCOUNTER — Emergency Department (HOSPITAL_COMMUNITY)
Admission: EM | Admit: 2013-12-12 | Discharge: 2013-12-12 | Disposition: A | Discharge status: Home / Self Care | Payer: Medicare Other | Source: Home / Self Care

## 2013-12-12 DIAGNOSIS — M549 Dorsalgia, unspecified: Secondary | ICD-10-CM

## 2013-12-12 MED ORDER — PREDNISONE 10 MG PO TABS: ORAL_TABLET | ORAL | Status: DC

## 2013-12-12 MED ORDER — KETOROLAC TROMETHAMINE 60 MG/2ML IM SOLN
INTRAMUSCULAR | Status: AC
  Filled 2013-12-12: qty 2

## 2013-12-12 MED ORDER — HYDROCODONE-ACETAMINOPHEN 5-325 MG PO TABS: 1.0000 | ORAL_TABLET | Freq: Four times a day (QID) | ORAL | Status: DC | PRN

## 2013-12-12 MED ORDER — KETOROLAC TROMETHAMINE 60 MG/2ML IM SOLN
60.0000 mg | Freq: Once | INTRAMUSCULAR | Status: AC
  Administered 2013-12-12: 60 mg via INTRAMUSCULAR

## 2013-12-12 NOTE — Discharge Instructions (Signed)
Back Pain, Adult ER if symptoms increase. Follow up at primary doctor in 4-5 days for possible referral to Ortho Back pain is very common. The pain often gets better over time. The cause of back pain is usually not dangerous. Most people can learn to manage their back pain on their own.  HOME CARE   Stay active. Start with short walks on flat ground if you can. Try to walk farther each day.  Do not sit, drive, or stand in one place for more than 30 minutes. Do not stay in bed.  Do not avoid exercise or work. Activity can help your back heal faster.  Be careful when you bend or lift an object. Bend at your knees, keep the object close to you, and do not twist.  Sleep on a firm mattress. Lie on your side, and bend your knees. If you lie on your back, put a pillow under your knees.  Only take medicines as told by your doctor.  Put ice on the injured area.  Put ice in a plastic bag.  Place a towel between your skin and the bag.  Leave the ice on for 15-20 minutes, 03-04 times a day for the first 2 to 3 days. After that, you can switch between ice and heat packs.  Ask your doctor about back exercises or massage.  Avoid feeling anxious or stressed. Find good ways to deal with stress, such as exercise. GET HELP RIGHT AWAY IF:   Your pain does not go away with rest or medicine.  Your pain does not go away in 1 week.  You have new problems.  You do not feel well.  The pain spreads into your legs.  You cannot control when you poop (bowel movement) or pee (urinate).  Your arms or legs feel weak or lose feeling (numbness).  You feel sick to your stomach (nauseous) or throw up (vomit).  You have belly (abdominal) pain.  You feel like you may pass out (faint). MAKE SURE YOU:   Understand these instructions.  Will watch your condition.  Will get help right away if you are not doing well or get worse. Document Released: 01/25/2008 Document Revised: 10/31/2011 Document  Reviewed: 12/27/2010 [facility] Patient Information 2014 [place].

## 2013-12-12 NOTE — ED Notes (Signed)
C/o pain in L post. Hip onset yesterday and was mild. Taking Tylenol 500 mg. every 6 hrs and used a heating pad.  Pt. woke up this AM and pain was worse. Pain is getting prog. Worse. No radiation of pain. Pain is sharp when she moves a certain way.  Leaning back.

## 2013-12-12 NOTE — ED Provider Notes (Signed)
CSN: [id number]     Arrival date & time 12/12/13  1825 History   None    Chief Complaint  Patient presents with  . Hip Pain   (Consider location/radiation/quality/duration/timing/severity/associated sxs/prior Treatment) HPI Comments: 78 yo AAF w/o history of fall/ trauma/ strain with left hip pain radiating into back x 2 days. She denies relief with Tylenol and notes pain is 57/84 with certain movements. She is able to bear weight and notes she had a good BMD test last year. She denies any skin breakdown or shingles history.   Patient is a 78 y.o. female presenting with hip pain.  Hip Pain    Past Medical History  Diagnosis Date  . Hypertension   . Cataracts, bilateral   . Arthritis   . GERD (gastroesophageal reflux disease)   . Bilateral dry eyes    Past Surgical History  Procedure Laterality Date  . Cataract extraction     Family History  Problem Relation Age of Onset  . Pneumonia Mother    History  Substance Use Topics  . Smoking status: Never Smoker   . Smokeless tobacco: Never Used  . Alcohol Use: No   OB History   Grav Para Term Preterm Abortions TAB SAB Ect Mult Living                 Review of Systems  Musculoskeletal: Positive for arthralgias and back pain.  All other systems reviewed and are negative.   Allergies  Tobramycin  Home Medications   Prior to Admission medications   Medication Sig Start Date End Date Taking? Authorizing Provider  amLODipine (NORVASC) 2.5 MG tablet Take 2.5 mg by mouth daily.   Yes Historical Provider, MD  cholecalciferol (VITAMIN D) 1000 UNITS tablet Take 1,000 Units by mouth daily.   Yes Historical Provider, MD  Garlic 6962 MG CAPS Take 1 capsule by mouth daily.   Yes Historical Provider, MD  Glucosamine-Chondroitin (ARTHX SS PO) Take 2 tablets by mouth daily.   Yes Historical Provider, MD  Glycerin-Polysorbate 80 (REFRESH DRY EYE THERAPY OP) Place 1-2 drops into both eyes 4 (four) times daily as needed (for dry eyes).     Yes Historical Provider, MD  potassium gluconate 595 MG TABS Take 595 mg by mouth daily.   Yes Historical Provider, MD  vitamin B-12 (CYANOCOBALAMIN) 1000 MCG tablet Take 1,000 mcg by mouth daily.   Yes Historical Provider, MD  Ascorbic Acid (VITAMIN C) 1000 MG tablet Take 1,000 mg by mouth daily.    Historical Provider, MD  Bilberry 1000 MG CAPS Take 1 capsule by mouth daily.    Historical Provider, MD  COD LIVER OIL PO Take 1 capsule by mouth daily.    Historical Provider, MD  Guaifenesin 1200 MG TB12 Take 1 tablet (1,200 mg total) by mouth 2 (two) times daily. 05/11/13   Resa Miner Lawyer, PA-C  ibuprofen (ADVIL,MOTRIN) 800 MG tablet Take 1 tablet (800 mg total) by mouth every 8 (eight) hours as needed for pain. 05/11/13   Resa Miner Lawyer, PA-C  KRILL OIL OMEGA-3 PO Take 1 capsule by mouth daily.    Historical Provider, MD  Multiple Vitamins-Minerals (ZINC PO) Take 1 tablet by mouth daily.    Historical Provider, MD  triamcinolone (NASACORT) 55 MCG/ACT nasal inhaler Place 2 sprays into the nose daily. 05/11/13   Resa Miner Lawyer, PA-C  vitamin E (VITAMIN E) 400 UNIT capsule Take 400 Units by mouth daily.    Historical Provider, MD   BP 149/58  Pulse 87  Temp(Src) 98.9 F (37.2 C) (Oral)  Resp 28  SpO2 100% Physical Exam  Nursing note and vitals reviewed. Constitutional: She appears well-developed and well-nourished.  Cardiovascular: Normal rate, regular rhythm and normal heart sounds.   Pulmonary/Chest: Effort normal and breath sounds normal.  Abdominal: Soft.  Musculoskeletal:  + SLR left hip  Neurological: She is alert. No cranial nerve deficit.  Skin: Skin is warm and dry.  Psychiatric: She has a normal mood and affect. Judgment normal.    ED Course  Procedures (including critical care time) Labs Review Labs Reviewed - No data to display  Results for orders placed during the hospital encounter of 46/27/03  BASIC METABOLIC PANEL      Result Value Ref Range    Sodium 140  135 - 145 mEq/L   Potassium 3.2 (*) 3.5 - 5.1 mEq/L   Chloride 103  96 - 112 mEq/L   CO2 28  19 - 32 mEq/L   Glucose, Bld 114 (*) 70 - 99 mg/dL   BUN 14  6 - 23 mg/dL   Creatinine, Ser 0.74  0.50 - 1.10 mg/dL   Calcium 9.5  8.4 - 10.5 mg/dL   GFR calc non Af Amer 81 (*) >90 mL/min   GFR calc Af Amer >90  >90 mL/min  MAGNESIUM      Result Value Ref Range   Magnesium 1.9  1.5 - 2.5 mg/dL   Imaging Review Dg Lumbar Spine Complete  12/12/2013   CLINICAL DATA:  Pain.  EXAM: LUMBAR SPINE - COMPLETE 4+ VIEW  COMPARISON:  DG ABDOMEN 1V dated 11/14/2009  FINDINGS: Diffuse multilevel degenerative change lumbar spine. Vacuum disc phenomenon noted at L3-L4, L4-L5 and L5-S1. Prominent endplate osteophytes noted. Diffuse facet hypertrophy. Good anatomic alignment and bony mineralization. Pedicles are intact. Degenerative changes both hips. Aortoiliac atherosclerotic vascular disease.  IMPRESSION: Diffuse severe degenerative changes thoracolumbar and lumbosacral spine.   Electronically Signed   By: Marcello Moores  Register   On: 12/12/2013 21:00   Dg Hip Complete Left  12/12/2013   CLINICAL DATA:  Low back and left hip pain increasing since yesterday. No known injury.  EXAM: LEFT HIP - COMPLETE 2+ VIEW  COMPARISON:  Femur radiographs 12/01/2010.  FINDINGS: Moderate osteoarthritic changes at the left hip have not significantly progressed. There is joint space loss with osteophyte formation and mild acetabular protrusio. There is no evidence of acute fracture, dislocation or femoral head avascular necrosis.  IMPRESSION: Left hip osteoarthritis as before.  No acute osseous findings.   Electronically Signed   By: Camie Patience M.D.   On: 12/12/2013 20:57     MDM  Back/ left hip pain with positive xrays for degenerative changes- advised Pred DP 10 mg Ad, Norco 5 AD/ use sparingly. F/U PCP in 1 week for referral to Ortho. toradol 60 mg given in office.  No obvious signs of Shingles.    Ardis Hughs,  PA-C 12/12/13 2136  Ardis Hughs, PA-C 12/12/13 2137

## 2013-12-13 NOTE — ED Provider Notes (Signed)
Medical screening examination/treatment/procedure(s) were performed by a resident physician or non-physician practitioner and as the supervising physician I was immediately available for consultation/collaboration.  Lynne Leader, MD    Gregor Hams, MD 12/13/13 [phone]

## 2014-03-09 ENCOUNTER — Emergency Department (HOSPITAL_COMMUNITY)
Admission: EM | Admit: 2014-03-09 | Discharge: 2014-03-10 | Disposition: A | Discharge status: Home / Self Care | Payer: Medicare Other | Attending: Emergency Medicine | Admitting: Emergency Medicine

## 2014-03-09 DIAGNOSIS — Z79899 Other long term (current) drug therapy: Secondary | ICD-10-CM | POA: Insufficient documentation

## 2014-03-09 DIAGNOSIS — Z8739 Personal history of other diseases of the musculoskeletal system and connective tissue: Secondary | ICD-10-CM | POA: Insufficient documentation

## 2014-03-09 DIAGNOSIS — I10 Essential (primary) hypertension: Secondary | ICD-10-CM | POA: Insufficient documentation

## 2014-03-09 DIAGNOSIS — K219 Gastro-esophageal reflux disease without esophagitis: Secondary | ICD-10-CM | POA: Insufficient documentation

## 2014-03-09 DIAGNOSIS — Z9849 Cataract extraction status, unspecified eye: Secondary | ICD-10-CM | POA: Insufficient documentation

## 2014-03-10 ENCOUNTER — Encounter (HOSPITAL_COMMUNITY): Payer: Self-pay | Admitting: Emergency Medicine

## 2014-03-10 ENCOUNTER — Emergency Department (HOSPITAL_COMMUNITY): Payer: Medicare Other

## 2014-03-10 LAB — RAPID STREP SCREEN (MED CTR MEBANE ONLY): STREPTOCOCCUS, GROUP A SCREEN (DIRECT): NEGATIVE

## 2014-03-10 MED ORDER — GI COCKTAIL ~~LOC~~
30.0000 mL | Freq: Once | ORAL | Status: AC
  Administered 2014-03-10: 30 mL via ORAL
  Filled 2014-03-10: qty 30

## 2014-03-10 NOTE — ED Provider Notes (Signed)
CSN: [id number]     Arrival date & time 03/09/14  2351 History   First MD Initiated Contact with Patient 03/09/14 2359     Chief Complaint  Patient presents with  . Sore Throat     (Consider location/radiation/quality/duration/timing/severity/associated sxs/prior Treatment) HPI  patient is a 78 year old woman with a history of GERD. She presents with a sensation of irritation in her throat. This started after she slightly choked while sipping on water around 11 PM. She can immediately to the emergency department. She has had frequent belching since this episode occurred. She denies coughing as well as shortness of breath. She hasn't chest pain.  She says she was very concerned that she had trouble drinking water because she drinks a lot of water. In the emergency department, she has been sipping on ginger ale without difficulty.  Past Medical History  Diagnosis Date  . Hypertension   . Cataracts, bilateral   . Arthritis   . GERD (gastroesophageal reflux disease)   . Bilateral dry eyes    Past Surgical History  Procedure Laterality Date  . Cataract extraction     Family History  Problem Relation Age of Onset  . Pneumonia Mother    History  Substance Use Topics  . Smoking status: Never Smoker   . Smokeless tobacco: Never Used  . Alcohol Use: No   OB History   Grav Para Term Preterm Abortions TAB SAB Ect Mult Living                 Review of Systems  Ten point review of symptoms performed and is negative with the exception of symptoms noted above.   Allergies  Tobramycin  Home Medications   Prior to Admission medications   Medication Sig Start Date End Date Taking? Authorizing Provider  amLODipine (NORVASC) 2.5 MG tablet Take 2.5 mg by mouth daily.   Yes Historical Provider, MD  cholecalciferol (VITAMIN D) 1000 UNITS tablet Take 1,000 Units by mouth daily.   Yes Historical Provider, MD  COD LIVER OIL PO Take 1 capsule by mouth daily.   Yes Historical Provider, MD   Garlic 4315 MG CAPS Take 1 capsule by mouth daily.   Yes Historical Provider, MD  Glucosamine-Chondroitin (ARTHX SS PO) Take 2 tablets by mouth daily.   Yes Historical Provider, MD  Glycerin-Polysorbate 80 (REFRESH DRY EYE THERAPY OP) Place 1-2 drops into both eyes 4 (four) times daily as needed (for dry eyes).    Yes Historical Provider, MD  KRILL OIL OMEGA-3 PO Take 1 capsule by mouth daily.   Yes Historical Provider, MD  Multiple Vitamins-Minerals (ZINC PO) Take 1 tablet by mouth daily.   Yes Historical Provider, MD  OVER THE COUNTER MEDICATION 3 tablets daily after breakfast.   Yes Historical Provider, MD  potassium gluconate 595 MG TABS Take 595 mg by mouth daily.   Yes Historical Provider, MD  vitamin B-12 (CYANOCOBALAMIN) 1000 MCG tablet Take 1,000 mcg by mouth daily.   Yes Historical Provider, MD   BP 122/60  Pulse 66  Temp(Src) 98.2 F (36.8 C) (Oral)  Resp 19  Ht 5\' 6"  (1.676 m)  Wt 154 lb (69.854 kg)  BMI 24.87 kg/m2  SpO2 96% Physical Exam Gen: well developed and well nourished appearing, frequent belching.  Head: NCAT Eyes: PERL, EOMI Nose: no epistaixis or rhinorrhea Mouth/throat: mucosa is moist and pink Neck: supple, no stridor Lungs: CTA B, no wheezing, rhonchi or rales CV: RRR, no murmur, extremities appear well perfused.  Abd:  soft, notender, nondistended Back: no ttp, no cva ttp Skin: warm and dry Ext: normal to inspection, no dependent edema Neuro: CN ii-xii grossly intact, no focal deficits Psyche; normal affect,  calm and cooperative.   ED Course  Procedures (including critical care time) Labs Review  Imaging Review Dg Chest 2 View  03/10/2014   CLINICAL DATA:  Sore throat and difficulty swallowing.  EXAM: CHEST  2 VIEW  COMPARISON:  Chest radiographs performed 07/26/2010  FINDINGS: The lungs are well-aerated and clear. There is no evidence of focal opacification, pleural effusion or pneumothorax.  The heart is borderline enlarged. No acute osseous  abnormalities are seen.  IMPRESSION: Borderline cardiomegaly; lungs remain grossly clear.   Electronically Signed   By: Garald Balding M.D.   On: 03/10/2014 02:52      MDM   Patient with belching and throat irritation following a single episode of difficulty swallowing water but story inconsistent with aspiration. No signs of aspiration on exam. Normal CXR. Patient has tolerate po intake well in the ED and her sx responded to a GI cocktail. Suspect this episode was related to GERD.    Final diagnoses:  Gastroesophageal reflux disease without esophagitis       Elyn Peers, MD 03/10/14 [phone]

## 2014-03-10 NOTE — ED Notes (Signed)
Pt states she was at home drinking some Ice tea and suddenly she wasn't able to swallow because started having discomfort on her throat. Pt denies any SOB, n/v at this time. Denies shucking with any food or drink. Pt states feels like when she had strep on her throat before.

## 2014-03-11 LAB — CULTURE, GROUP A STREP

## 2014-05-26 ENCOUNTER — Emergency Department (HOSPITAL_COMMUNITY)
Admission: EM | Admit: 2014-05-26 | Discharge: 2014-05-27 | Disposition: A | Discharge status: Home / Self Care | Payer: Medicare Other | Attending: Emergency Medicine | Admitting: Emergency Medicine

## 2014-05-26 ENCOUNTER — Encounter (HOSPITAL_COMMUNITY): Payer: Self-pay | Admitting: Emergency Medicine

## 2014-05-26 DIAGNOSIS — Z8669 Personal history of other diseases of the nervous system and sense organs: Secondary | ICD-10-CM | POA: Diagnosis not present

## 2014-05-26 DIAGNOSIS — Z79899 Other long term (current) drug therapy: Secondary | ICD-10-CM | POA: Insufficient documentation

## 2014-05-26 DIAGNOSIS — I10 Essential (primary) hypertension: Secondary | ICD-10-CM | POA: Diagnosis not present

## 2014-05-26 DIAGNOSIS — Z8719 Personal history of other diseases of the digestive system: Secondary | ICD-10-CM | POA: Diagnosis not present

## 2014-05-26 DIAGNOSIS — G8929 Other chronic pain: Secondary | ICD-10-CM | POA: Insufficient documentation

## 2014-05-26 DIAGNOSIS — N390 Urinary tract infection, site not specified: Secondary | ICD-10-CM | POA: Diagnosis not present

## 2014-05-26 DIAGNOSIS — R51 Headache: Secondary | ICD-10-CM | POA: Diagnosis present

## 2014-05-26 DIAGNOSIS — R42 Dizziness and giddiness: Secondary | ICD-10-CM | POA: Diagnosis not present

## 2014-05-26 DIAGNOSIS — M1990 Unspecified osteoarthritis, unspecified site: Secondary | ICD-10-CM | POA: Diagnosis not present

## 2014-05-26 DIAGNOSIS — Z8739 Personal history of other diseases of the musculoskeletal system and connective tissue: Secondary | ICD-10-CM | POA: Insufficient documentation

## 2014-05-26 DIAGNOSIS — H9202 Otalgia, left ear: Secondary | ICD-10-CM | POA: Insufficient documentation

## 2014-05-26 HISTORY — DX: Other chronic pain: G89.29

## 2014-05-26 HISTORY — DX: Headache: R51

## 2014-05-26 HISTORY — DX: Headache, unspecified: R51.9

## 2014-05-26 HISTORY — DX: Arthralgia of temporomandibular joint, unspecified side: M26.629

## 2014-05-26 NOTE — ED Notes (Addendum)
Pt reports generalized Ha and sinus pressure x 3 months. States she was recently dx with TMJ. Pt now reports HA is getting worse, that she is developing bilateral ear aches and she occasionally feels lightheaded. Denies syncope or CP. Pt in NAD. Ambulatory to triage. Reports 6/10 sinus pain.

## 2014-05-27 ENCOUNTER — Emergency Department (HOSPITAL_COMMUNITY): Payer: Medicare Other

## 2014-05-27 ENCOUNTER — Encounter (HOSPITAL_COMMUNITY): Payer: Self-pay

## 2014-05-27 LAB — CBC WITH DIFFERENTIAL/PLATELET
BASOS ABS: 0 10*3/uL (ref 0.0–0.1)
BASOS PCT: 0 % (ref 0–1)
EOS PCT: 2 % (ref 0–5)
Eosinophils Absolute: 0.1 10*3/uL (ref 0.0–0.7)
HEMATOCRIT: 34.4 % — AB (ref 36.0–46.0)
Hemoglobin: 11.8 g/dL — ABNORMAL LOW (ref 12.0–15.0)
Lymphocytes Relative: 33 % (ref 12–46)
Lymphs Abs: 1.6 10*3/uL (ref 0.7–4.0)
MCH: 29.3 pg (ref 26.0–34.0)
MCHC: 34.3 g/dL (ref 30.0–36.0)
MCV: 85.4 fL (ref 78.0–100.0)
MONO ABS: 0.4 10*3/uL (ref 0.1–1.0)
Monocytes Relative: 9 % (ref 3–12)
Neutro Abs: 2.6 10*3/uL (ref 1.7–7.7)
Neutrophils Relative %: 56 % (ref 43–77)
PLATELETS: 266 10*3/uL (ref 150–400)
RBC: 4.03 MIL/uL (ref 3.87–5.11)
RDW: 14.8 % (ref 11.5–15.5)
WBC: 4.7 10*3/uL (ref 4.0–10.5)

## 2014-05-27 LAB — URINALYSIS, ROUTINE W REFLEX MICROSCOPIC
BILIRUBIN URINE: NEGATIVE
Glucose, UA: NEGATIVE mg/dL
Hgb urine dipstick: NEGATIVE
KETONES UR: NEGATIVE mg/dL
NITRITE: NEGATIVE
Protein, ur: NEGATIVE mg/dL
Specific Gravity, Urine: 1.013 (ref 1.005–1.030)
UROBILINOGEN UA: 0.2 mg/dL (ref 0.0–1.0)
pH: 6.5 (ref 5.0–8.0)

## 2014-05-27 LAB — URINE MICROSCOPIC-ADD ON

## 2014-05-27 LAB — BASIC METABOLIC PANEL
ANION GAP: 11 (ref 5–15)
BUN: 12 mg/dL (ref 6–23)
CALCIUM: 9.2 mg/dL (ref 8.4–10.5)
CO2: 27 mEq/L (ref 19–32)
CREATININE: 0.81 mg/dL (ref 0.50–1.10)
Chloride: 103 mEq/L (ref 96–112)
GFR calc non Af Amer: 69 mL/min — ABNORMAL LOW (ref >90)
GFR, EST AFRICAN AMERICAN: 80 mL/min — AB (ref >90)
Glucose, Bld: 99 mg/dL (ref 70–99)
Potassium: 3.9 mEq/L (ref 3.7–5.3)
SODIUM: 141 meq/L (ref 137–147)

## 2014-05-27 MED ORDER — IBUPROFEN 800 MG PO TABS
800.0000 mg | ORAL_TABLET | Freq: Once | ORAL | Status: AC
  Administered 2014-05-27: 800 mg via ORAL
  Filled 2014-05-27: qty 1

## 2014-05-27 MED ORDER — CEPHALEXIN 250 MG PO CAPS
500.0000 mg | ORAL_CAPSULE | Freq: Once | ORAL | Status: AC
  Administered 2014-05-27: 500 mg via ORAL
  Filled 2014-05-27: qty 2

## 2014-05-27 MED ORDER — SODIUM CHLORIDE 0.9 % IV BOLUS (SEPSIS): 1000.0000 mL | Freq: Once | INTRAVENOUS | Status: DC

## 2014-05-27 MED ORDER — CEPHALEXIN 500 MG PO CAPS: 500.0000 mg | ORAL_CAPSULE | Freq: Two times a day (BID) | ORAL | Status: DC

## 2014-05-27 NOTE — ED Notes (Signed)
Dr. Oni at bedside. 

## 2014-05-27 NOTE — ED Provider Notes (Signed)
CSN: [id number]     Arrival date & time 05/26/14  2049 History   First MD Initiated Contact with Patient 05/26/14 2352     Chief Complaint  Patient presents with  . Headache  . Facial Pain  . Otalgia     (Consider location/radiation/quality/duration/timing/severity/associated sxs/prior Treatment) HPI Dawn Rodriguez is a 78 y.o. female with past medical history of hypertension and TMJ and chronic headaches coming in with a headache and dizziness. Patient states she has had a headache for the past 4 months, which was diagnosed as chronic sinusitis versus TMJ. She only gets a headache every evening, but this time it was associated with dizziness. This began around 7 PM and lasted 5-10 minutes. It resolved and then returned for a few hours. It then spontaneously resolved again in the waiting room. So describes left ear pain has been going on for months. Patient does emergency department because she is never been dizzy before. She states she felt off balance as if she was going to fall over. She denies any other neurologic symptoms such as blurry vision difficulty speaking or eating, muscle weakness or decreased sensation. She has no chest pain or shortness of breath. Patient has no further complaints.  10 Systems reviewed and are negative for acute change except as noted in the HPI.     Past Medical History  Diagnosis Date  . Hypertension   . Cataracts, bilateral   . Arthritis   . GERD (gastroesophageal reflux disease)   . Bilateral dry eyes   . TMJ syndrome   . Chronic headache    Past Surgical History  Procedure Laterality Date  . Cataract extraction     Family History  Problem Relation Age of Onset  . Pneumonia Mother    History  Substance Use Topics  . Smoking status: Never Smoker   . Smokeless tobacco: Never Used  . Alcohol Use: No   OB History   Grav Para Term Preterm Abortions TAB SAB Ect Mult Living                 Review of Systems    Allergies   Tobramycin  Home Medications   Prior to Admission medications   Medication Sig Start Date End Date Taking? Authorizing Provider  amLODipine (NORVASC) 2.5 MG tablet Take 2.5 mg by mouth daily.    Historical Provider, MD  cholecalciferol (VITAMIN D) 1000 UNITS tablet Take 1,000 Units by mouth daily.    Historical Provider, MD  COD LIVER OIL PO Take 1 capsule by mouth daily.    Historical Provider, MD  Garlic 8315 MG CAPS Take 1 capsule by mouth daily.    Historical Provider, MD  Glucosamine-Chondroitin (ARTHX SS PO) Take 2 tablets by mouth daily.    Historical Provider, MD  Glycerin-Polysorbate 80 (REFRESH DRY EYE THERAPY OP) Place 1-2 drops into both eyes 4 (four) times daily as needed (for dry eyes).     Historical Provider, MD  KRILL OIL OMEGA-3 PO Take 1 capsule by mouth daily.    Historical Provider, MD  Multiple Vitamins-Minerals (ZINC PO) Take 1 tablet by mouth daily.    Historical Provider, MD  OVER THE COUNTER MEDICATION 3 tablets daily after breakfast.    Historical Provider, MD  potassium gluconate 595 MG TABS Take 595 mg by mouth daily.    Historical Provider, MD  vitamin B-12 (CYANOCOBALAMIN) 1000 MCG tablet Take 1,000 mcg by mouth daily.    Historical Provider, MD   BP 150/60  Pulse  71  Temp(Src) 98.5 F (36.9 C) (Oral)  Resp 18  SpO2 99% Physical Exam  Nursing note and vitals reviewed. Constitutional: She is oriented to person, place, and time. She appears well-developed and well-nourished. No distress.  HENT:  Head: Normocephalic and atraumatic.  Right Ear: External ear normal.  Left Ear: External ear normal.  Nose: Nose normal.  Mouth/Throat: Oropharynx is clear and moist. No oropharyngeal exudate.  Right TM visualized and is normal. Left ear canal is impacted with cerumen.  Eyes: Conjunctivae and EOM are normal. Pupils are equal, round, and reactive to light. No scleral icterus.  Neck: Normal range of motion. Neck supple. No JVD present. No tracheal deviation  present. No thyromegaly present.  Cardiovascular: Normal rate, regular rhythm and normal heart sounds.  Exam reveals no gallop and no friction rub.   No murmur heard. Pulmonary/Chest: Effort normal and breath sounds normal. No respiratory distress. She has no wheezes. She exhibits no tenderness.  Abdominal: Soft. Bowel sounds are normal. She exhibits no distension and no mass. There is no tenderness. There is no rebound and no guarding.  Musculoskeletal: Normal range of motion. She exhibits no edema and no tenderness.  Lymphadenopathy:    She has no cervical adenopathy.  Neurological: She is alert and oriented to person, place, and time. No cranial nerve deficit. She exhibits normal muscle tone.  Normal strength and sensation x4 extremities. Normal cerebellar testing.  Skin: Skin is warm and dry. No rash noted. She is not diaphoretic. No erythema. No pallor.    ED Course  Procedures (including critical care time) Labs Review Labs Reviewed  CBC WITH DIFFERENTIAL - Abnormal; Notable for the following:    Hemoglobin 11.8 (*)    HCT 34.4 (*)    All other components within normal limits  BASIC METABOLIC PANEL - Abnormal; Notable for the following:    GFR calc non Af Amer 69 (*)    GFR calc Af Amer 80 (*)    All other components within normal limits  URINALYSIS, ROUTINE W REFLEX MICROSCOPIC - Abnormal; Notable for the following:    APPearance CLOUDY (*)    Leukocytes, UA LARGE (*)    All other components within normal limits  URINE MICROSCOPIC-ADD ON - Abnormal; Notable for the following:    Squamous Epithelial / LPF FEW (*)    Bacteria, UA MANY (*)    All other components within normal limits    Imaging Review Ct Head Wo Contrast  05/27/2014   CLINICAL DATA:  Acute onset of left sided headache, extending across the forehead. Otalgia. Initial encounter.  EXAM: CT HEAD WITHOUT CONTRAST  TECHNIQUE: Contiguous axial images were obtained from the base of the skull through the vertex  without intravenous contrast.  COMPARISON:  CT of the head performed 05/11/2013  FINDINGS: There is no evidence of acute infarction, mass lesion, or intra- or extra-axial hemorrhage on CT.  The posterior fossa, including the cerebellum, brainstem and fourth ventricle, is within normal limits. The third and lateral ventricles, and basal ganglia are unremarkable in appearance. The cerebral hemispheres are symmetric in appearance, with normal gray-white differentiation. No mass effect or midline shift is seen.  There is no evidence of fracture; visualized osseous structures are unremarkable in appearance. The visualized portions of the orbits are within normal limits. The paranasal sinuses and mastoid air cells are well-aerated. No significant soft tissue abnormalities are seen.  IMPRESSION: Unremarkable noncontrast CT of the head.   Electronically Signed   By: Garald Balding  M.D.   On: 05/27/2014 02:09     EKG Interpretation   Date/Time:  Tuesday May 27 2014 00:18:46 EDT Ventricular Rate:  63 PR Interval:  128 QRS Duration: 93 QT Interval:  448 QTC Calculation: 459 R Axis:   66 Text Interpretation:  Sinus rhythm Left ventricular hypertrophy No  significant change since last tracing Confirmed by Glynn Octave  [phone]) on 05/27/2014 12:43:13 AM      MDM   Final diagnoses:  None    Patient presents emergency department out of concern for dizziness. Will obtain basic labs and CT scan to evaluate for possible stroke.   Laboratory studies reveal a urinary tract infection. CT scan is negative. Patient has had persistent ear pain and headache for months since June. Her only change is dizziness. This could be attributed to her urinary tract infection. She was given Keflex emergency department and sent home with a prescription for the same. She is advised to call the primary care physician within 3 days for continued care and return precautions were given. Her vital signs remain within her  normal limits and she is safe for discharge.    Everlene Balls, MD 05/27/14 [phone]

## 2014-05-27 NOTE — ED Notes (Signed)
Pt ambulated to restroom with steady gait.

## 2014-05-27 NOTE — Discharge Instructions (Signed)
Urinary Tract Infection Dawn Rodriguez, you were seen today for dizziness.  Your CT scan was normal and your neurological exam was normal.  You did have a UTI and were given antibiotics.  Follow up with your regular doctor within 3 days for continued care.  If symptoms or dizziness return or worsen, come back to the ED for repeat evaluation. Thank you. A urinary tract infection (UTI) can occur any place along the urinary tract. The tract includes the kidneys, ureters, bladder, and urethra. A type of germ called bacteria often causes a UTI. UTIs are often helped with antibiotic medicine.  HOME CARE   If given, take antibiotics as told by your doctor. Finish them even if you start to feel better.  Drink enough fluids to keep your pee (urine) clear or pale yellow.  Avoid tea, drinks with caffeine, and bubbly (carbonated) drinks.  Pee often. Avoid holding your pee in for a long time.  Pee before and after having sex (intercourse).  Wipe from front to back after you poop (bowel movement) if you are a woman. Use each tissue only once. GET HELP RIGHT AWAY IF:   You have back pain.  You have lower belly (abdominal) pain.  You have chills.  You feel sick to your stomach (nauseous).  You throw up (vomit).  Your burning or discomfort with peeing does not go away.  You have a fever.  Your symptoms are not better in 3 days. MAKE SURE YOU:   Understand these instructions.  Will watch your condition.  Will get help right away if you are not doing well or get worse. Document Released: 01/25/2008 Document Revised: 05/02/2012 Document Reviewed: 03/08/2012 [facility] Patient Information 2015 [place], [place]. This information is not intended to replace advice given to you by your health care provider. Make sure you discuss any questions you have with your health care provider.

## 2014-08-29 DIAGNOSIS — R05 Cough: Secondary | ICD-10-CM | POA: Diagnosis not present

## 2014-08-29 DIAGNOSIS — J310 Chronic rhinitis: Secondary | ICD-10-CM | POA: Diagnosis not present

## 2014-10-15 ENCOUNTER — Other Ambulatory Visit: Payer: Self-pay

## 2014-11-06 ENCOUNTER — Encounter (HOSPITAL_COMMUNITY): Payer: Self-pay | Admitting: Emergency Medicine

## 2014-11-06 ENCOUNTER — Emergency Department (HOSPITAL_COMMUNITY)
Admission: EM | Admit: 2014-11-06 | Discharge: 2014-11-06 | Disposition: A | Discharge status: Home / Self Care | Payer: Medicare Other | Source: Home / Self Care | Attending: Family Medicine | Admitting: Family Medicine

## 2014-11-06 DIAGNOSIS — R519 Headache, unspecified: Secondary | ICD-10-CM

## 2014-11-06 DIAGNOSIS — R51 Headache: Secondary | ICD-10-CM

## 2014-11-06 DIAGNOSIS — R42 Dizziness and giddiness: Secondary | ICD-10-CM

## 2014-11-06 MED ORDER — TRAMADOL HCL 50 MG PO TABS: 25.0000 mg | ORAL_TABLET | Freq: Two times a day (BID) | ORAL | Status: DC | PRN

## 2014-11-06 NOTE — ED Notes (Signed)
See provider's note

## 2014-11-06 NOTE — ED Provider Notes (Signed)
Dawn Rodriguez is a 79 y.o. female who presents to Urgent Care today for headache and dizzy. This morning patient developed a minute of mild dizziness. She denies any vertigo. She notes a vague unsteady feeling. She denies any presyncopal feelings. This past vertigo quickly. She notes a mild headache that's consistent with previous episodes of headache as well today. She's tried some Tylenol which helped some. She is intolerant of NSAIDs. She denies any chest pains palpitations or shortness of breath. No weakness or numbness or loss of function.   Past Medical History  Diagnosis Date  . Hypertension   . Cataracts, bilateral   . Arthritis   . GERD (gastroesophageal reflux disease)   . Bilateral dry eyes   . TMJ syndrome   . Chronic headache    Past Surgical History  Procedure Laterality Date  . Cataract extraction     History  Substance Use Topics  . Smoking status: Never Smoker   . Smokeless tobacco: Never Used  . Alcohol Use: No   ROS as above Medications: No current facility-administered medications for this encounter.   Current Outpatient Prescriptions  Medication Sig Dispense Refill  . amLODipine (NORVASC) 2.5 MG tablet Take 2.5 mg by mouth daily.    . cephALEXin (KEFLEX) 500 MG capsule Take 1 capsule (500 mg total) by mouth 2 (two) times daily. 10 capsule 0  . cholecalciferol (VITAMIN D) 1000 UNITS tablet Take 1,000 Units by mouth daily.    . COD LIVER OIL PO Take 1 capsule by mouth daily.    . Garlic 7026 MG CAPS Take 1 capsule by mouth daily.    . Glucosamine-Chondroitin (ARTHX SS PO) Take 2 tablets by mouth daily.    . Glycerin-Polysorbate 80 (REFRESH DRY EYE THERAPY OP) Place 1-2 drops into both eyes 4 (four) times daily as needed (for dry eyes).     Marland Kitchen KRILL OIL OMEGA-3 PO Take 1 capsule by mouth daily.    . Multiple Vitamins-Minerals (MULTIVITAMIN WITH MINERALS) tablet Take 1 tablet by mouth daily.    . potassium gluconate 595 MG TABS Take 595 mg by mouth daily.    .  traMADol (ULTRAM) 50 MG tablet Take 0.5-1 tablets (25-50 mg total) by mouth every 12 (twelve) hours as needed. 10 tablet 0  . vitamin B-12 (CYANOCOBALAMIN) 1000 MCG tablet Take 1,000 mcg by mouth daily.     Allergies  Allergen Reactions  . Tobramycin Itching, Swelling and Rash    Tobramycin eye drops caused swelling, itching, drainage, rash and peeling of the skin     Exam:  BP 155/64 mmHg  Pulse 77  Temp(Src) 98.5 F (36.9 C) (Oral)  Resp 18  SpO2 97% Gen: Well NAD HEENT: EOMI,  MMM PERRLA Lungs: Normal work of breathing. CTABL Heart: RRR no MRG Abd: NABS, Soft. Nondistended, Nontender Exts: Brisk capillary refill, warm and well perfused.  Neuro: Cranial history to 12 are intact coordination balance gait sensation and reflexes and strength.  No results found for this or any previous visit (from the past 24 hour(s)). No results found.  Assessment and Plan: 79 y.o. female with headache and dizziness. Patient appears to be well. Treat headache with tiny amount of tramadol as needed. Follow-up with PCP.  Discussed warning signs or symptoms. Please see discharge instructions. Patient expresses understanding.     Gregor Hams, MD 11/06/14 [phone]

## 2014-11-06 NOTE — Discharge Instructions (Signed)
Thank you for coming in today. Continue Tylenol Use 1/2 or 1 tab of tramadol for bad headache.  Follow up with primary doctor.  Go to the emergency room if your headache becomes excruciating or you have weakness or numbness or uncontrolled vomiting.   General Headache Without Cause A headache is pain or discomfort felt around the head or neck area. The specific cause of a headache may not be found. There are many causes and types of headaches. A few common ones are:  Tension headaches.  Migraine headaches.  Cluster headaches.  Chronic daily headaches. HOME CARE INSTRUCTIONS   Keep all follow-up appointments with your caregiver or any specialist referral.  Only take over-the-counter or prescription medicines for pain or discomfort as directed by your caregiver.  Lie down in a dark, quiet room when you have a headache.  Keep a headache journal to find out what may trigger your migraine headaches. For example, write down:  What you eat and drink.  How much sleep you get.  Any change to your diet or medicines.  Try massage or other relaxation techniques.  Put ice packs or heat on the head and neck. Use these 3 to 4 times per day for 15 to 20 minutes each time, or as needed.  Limit stress.  Sit up straight, and do not tense your muscles.  Quit smoking if you smoke.  Limit alcohol use.  Decrease the amount of caffeine you drink, or stop drinking caffeine.  Eat and sleep on a regular schedule.  Get 7 to 9 hours of sleep, or as recommended by your caregiver.  Keep lights dim if bright lights bother you and make your headaches worse. SEEK MEDICAL CARE IF:   You have problems with the medicines you were prescribed.  Your medicines are not working.  You have a change from the usual headache.  You have nausea or vomiting. SEEK IMMEDIATE MEDICAL CARE IF:   Your headache becomes severe.  You have a fever.  You have a stiff neck.  You have loss of vision.  You  have muscular weakness or loss of muscle control.  You start losing your balance or have trouble walking.  You feel faint or pass out.  You have severe symptoms that are different from your first symptoms. MAKE SURE YOU:   Understand these instructions.  Will watch your condition.  Will get help right away if you are not doing well or get worse. Document Released: 08/08/2005 Document Revised: 10/31/2011 Document Reviewed: 08/24/2011 [facility] Patient Information 2015 [place], [place]. This information is not intended to replace advice given to you by your health care provider. Make sure you discuss any questions you have with your health care provider.

## 2014-11-10 ENCOUNTER — Other Ambulatory Visit: Payer: Self-pay | Admitting: Nurse Practitioner

## 2014-11-10 DIAGNOSIS — N63 Unspecified lump in unspecified breast: Secondary | ICD-10-CM

## 2014-11-14 ENCOUNTER — Ambulatory Visit
Admission: RE | Admit: 2014-11-14 | Discharge: 2014-11-14 | Disposition: A | Discharge status: Home / Self Care | Payer: Medicare Other | Source: Ambulatory Visit | Attending: Nurse Practitioner | Admitting: Nurse Practitioner

## 2014-11-14 DIAGNOSIS — N63 Unspecified lump in unspecified breast: Secondary | ICD-10-CM

## 2015-02-16 ENCOUNTER — Encounter: Payer: Self-pay | Admitting: Internal Medicine

## 2015-03-22 ENCOUNTER — Encounter (HOSPITAL_COMMUNITY): Payer: Self-pay | Admitting: Emergency Medicine

## 2015-03-22 ENCOUNTER — Emergency Department (HOSPITAL_COMMUNITY)
Admission: EM | Admit: 2015-03-22 | Discharge: 2015-03-22 | Disposition: A | Discharge status: Home / Self Care | Payer: Medicare Other | Source: Home / Self Care | Attending: Family Medicine | Admitting: Family Medicine

## 2015-03-22 DIAGNOSIS — J029 Acute pharyngitis, unspecified: Secondary | ICD-10-CM | POA: Diagnosis not present

## 2015-03-22 DIAGNOSIS — B370 Candidal stomatitis: Secondary | ICD-10-CM

## 2015-03-22 LAB — POCT RAPID STREP A: Streptococcus, Group A Screen (Direct): NEGATIVE

## 2015-03-22 MED ORDER — NYSTATIN 100000 UNIT/ML MT SUSP: 5.0000 mL | Freq: Four times a day (QID) | OROMUCOSAL | Status: DC

## 2015-03-22 NOTE — ED Notes (Signed)
Pt states that she has had a sore throat since Monday

## 2015-03-22 NOTE — Discharge Instructions (Signed)

## 2015-03-22 NOTE — ED Provider Notes (Signed)
CSN: [id number]     Arrival date & time 03/22/15  1646 History   First MD Initiated Contact with Patient 03/22/15 1836     Chief Complaint  Patient presents with  . Sore Throat   (Consider location/radiation/quality/duration/timing/severity/associated sxs/prior Treatment) HPI Comments: Patient presents with a sore throat x 6 days. Has tried salt rinses and honey without relief. It appears to be worsening. No cold symptoms are noted. No cough. She does have reflux, but this is unchanged. No fever or chills. She notes that her tongue appears "white".   The history is provided by the patient.    Past Medical History  Diagnosis Date  . Hypertension   . Cataracts, bilateral   . Arthritis   . GERD (gastroesophageal reflux disease)   . Bilateral dry eyes   . TMJ syndrome   . Chronic headache    Past Surgical History  Procedure Laterality Date  . Cataract extraction     Family History  Problem Relation Age of Onset  . Pneumonia Mother    History  Substance Use Topics  . Smoking status: Never Smoker   . Smokeless tobacco: Never Used  . Alcohol Use: No   OB History    No data available     Review of Systems  All other systems reviewed and are negative.   Allergies  Tobramycin  Home Medications   Prior to Admission medications   Medication Sig Start Date End Date Taking? Authorizing Provider  amLODipine (NORVASC) 2.5 MG tablet Take 2.5 mg by mouth daily.    Historical Provider, MD  cephALEXin (KEFLEX) 500 MG capsule Take 1 capsule (500 mg total) by mouth 2 (two) times daily. 05/27/14   Everlene Balls, MD  cholecalciferol (VITAMIN D) 1000 UNITS tablet Take 1,000 Units by mouth daily.    Historical Provider, MD  COD LIVER OIL PO Take 1 capsule by mouth daily.    Historical Provider, MD  Garlic 3664 MG CAPS Take 1 capsule by mouth daily.    Historical Provider, MD  Glucosamine-Chondroitin (ARTHX SS PO) Take 2 tablets by mouth daily.    Historical Provider, MD   Glycerin-Polysorbate 80 (REFRESH DRY EYE THERAPY OP) Place 1-2 drops into both eyes 4 (four) times daily as needed (for dry eyes).     Historical Provider, MD  KRILL OIL OMEGA-3 PO Take 1 capsule by mouth daily.    Historical Provider, MD  Multiple Vitamins-Minerals (MULTIVITAMIN WITH MINERALS) tablet Take 1 tablet by mouth daily.    Historical Provider, MD  nystatin (MYCOSTATIN) 100000 UNIT/ML suspension Take 5 mLs (500,000 Units total) by mouth 4 (four) times daily. 03/22/15   Bjorn Pippin, PA-C  potassium gluconate 595 MG TABS Take 595 mg by mouth daily.    Historical Provider, MD  traMADol (ULTRAM) 50 MG tablet Take 0.5-1 tablets (25-50 mg total) by mouth every 12 (twelve) hours as needed. 11/06/14   Gregor Hams, MD  vitamin B-12 (CYANOCOBALAMIN) 1000 MCG tablet Take 1,000 mcg by mouth daily.    Historical Provider, MD   BP 162/79 mmHg  Pulse 72  Temp(Src) 99 F (37.2 C) (Oral)  Resp 14  SpO2 99% Physical Exam  Constitutional: She appears well-developed and well-nourished. No distress.  HENT:  Head: Normocephalic and atraumatic.  Right Ear: External ear normal.  Left Ear: External ear normal.  Mouth/Throat: No oropharyngeal exudate.  Mild oropharynx injection. No exudate. Tongue with thick plaque  Neck: Normal range of motion. Neck supple.  Pulmonary/Chest: Effort normal and  breath sounds normal.  Lymphadenopathy:    She has no cervical adenopathy.  Skin: Skin is warm and dry. No rash noted. She is not diaphoretic.  Psychiatric: Her behavior is normal.  Nursing note and vitals reviewed.   ED Course  Procedures (including critical care time) Labs Review Labs Reviewed  POCT RAPID STREP A    Imaging Review No results found.   MDM   1. Oral thrush   2. Pharyngitis    Probable thrush based on exam. Will treat with Nystatin. Strep negative and no indication for an antibiotic. Treat with Tylenol and medication rinse. F/U with PCP if continues.     Bjorn Pippin, PA-C 03/22/15 Kathyrn Drown

## 2015-03-24 LAB — CULTURE, GROUP A STREP: STREP A CULTURE: NEGATIVE

## 2015-11-20 DIAGNOSIS — R350 Frequency of micturition: Secondary | ICD-10-CM | POA: Diagnosis not present

## 2015-11-20 DIAGNOSIS — E782 Mixed hyperlipidemia: Secondary | ICD-10-CM | POA: Diagnosis not present

## 2015-11-20 DIAGNOSIS — I10 Essential (primary) hypertension: Secondary | ICD-10-CM | POA: Diagnosis not present

## 2016-02-06 ENCOUNTER — Ambulatory Visit (HOSPITAL_COMMUNITY)
Admission: EM | Admit: 2016-02-06 | Discharge: 2016-02-06 | Disposition: A | Discharge status: Home / Self Care | Payer: Commercial Managed Care - HMO | Attending: Family Medicine | Admitting: Family Medicine

## 2016-02-06 ENCOUNTER — Encounter (HOSPITAL_COMMUNITY): Payer: Self-pay | Admitting: *Deleted

## 2016-02-06 DIAGNOSIS — S86811A Strain of other muscle(s) and tendon(s) at lower leg level, right leg, initial encounter: Secondary | ICD-10-CM

## 2016-02-06 NOTE — Discharge Instructions (Signed)
Heat, advil and gentle stretch, activity as tolerated

## 2016-02-06 NOTE — ED Notes (Signed)
Pt  Reports   r leg  Pain       X  5  Days         Pt  denys  Any  specefic  Injury        She  denys  Any chest    Pain       Or  Any  Shortness of  Breath         She   Is  Sitting  Upright  On the  Exam table   Speaking in  Complete     sentances

## 2016-02-06 NOTE — ED Provider Notes (Signed)
CSN: [id number]     Arrival date & time 02/06/16  1902 History   First MD Initiated Contact with Patient 02/06/16 2104     Chief Complaint  Patient presents with  . Leg Pain   (Consider location/radiation/quality/duration/timing/severity/associated sxs/prior Treatment) Patient is a 80 y.o. female presenting with leg pain. The history is provided by the patient.  Leg Pain Location:  Leg Time since incident:  5 days Injury: no (did xs walking on track and next morningright calf was tight and sore.)   Leg location:  R lower leg Pain details:    Quality:  Sharp   Severity:  Mild   Onset quality:  Gradual   Duration:  5 days Chronicity:  New Dislocation: no   Foreign body present:  No foreign bodies Prior injury to area:  No Worsened by:  Exercise Associated symptoms: stiffness   Associated symptoms: no decreased ROM, no muscle weakness, no numbness and no swelling     Past Medical History  Diagnosis Date  . Hypertension   . Cataracts, bilateral   . Arthritis   . GERD (gastroesophageal reflux disease)   . Bilateral dry eyes   . TMJ syndrome   . Chronic headache    Past Surgical History  Procedure Laterality Date  . Cataract extraction     Family History  Problem Relation Age of Onset  . Pneumonia Mother    Social History  Substance Use Topics  . Smoking status: Never Smoker   . Smokeless tobacco: Never Used  . Alcohol Use: No   OB History    No data available     Review of Systems  Musculoskeletal: Positive for myalgias and stiffness. Negative for joint swelling and gait problem.  Skin: Negative.   All other systems reviewed and are negative.   Allergies  Tobramycin  Home Medications   Prior to Admission medications   Medication Sig Start Date End Date Taking? Authorizing Provider  amLODipine (NORVASC) 2.5 MG tablet Take 2.5 mg by mouth daily.    Historical Provider, MD  cephALEXin (KEFLEX) 500 MG capsule Take 1 capsule (500 mg total) by mouth 2 (two)  times daily. 05/27/14   Everlene Balls, MD  cholecalciferol (VITAMIN D) 1000 UNITS tablet Take 1,000 Units by mouth daily.    Historical Provider, MD  COD LIVER OIL PO Take 1 capsule by mouth daily.    Historical Provider, MD  Garlic 123XX123 MG CAPS Take 1 capsule by mouth daily.    Historical Provider, MD  Glucosamine-Chondroitin (ARTHX SS PO) Take 2 tablets by mouth daily.    Historical Provider, MD  Glycerin-Polysorbate 80 (REFRESH DRY EYE THERAPY OP) Place 1-2 drops into both eyes 4 (four) times daily as needed (for dry eyes).     Historical Provider, MD  KRILL OIL OMEGA-3 PO Take 1 capsule by mouth daily.    Historical Provider, MD  Multiple Vitamins-Minerals (MULTIVITAMIN WITH MINERALS) tablet Take 1 tablet by mouth daily.    Historical Provider, MD  nystatin (MYCOSTATIN) 100000 UNIT/ML suspension Take 5 mLs (500,000 Units total) by mouth 4 (four) times daily. 03/22/15   Bjorn Pippin, PA-C  potassium gluconate 595 MG TABS Take 595 mg by mouth daily.    Historical Provider, MD  traMADol (ULTRAM) 50 MG tablet Take 0.5-1 tablets (25-50 mg total) by mouth every 12 (twelve) hours as needed. 11/06/14   Gregor Hams, MD  vitamin B-12 (CYANOCOBALAMIN) 1000 MCG tablet Take 1,000 mcg by mouth daily.    Historical Provider,  MD   Meds Ordered and Administered this Visit  Medications - No data to display  BP 134/56 mmHg  Pulse 78  Temp(Src) 98.9 F (37.2 C) (Oral)  Resp 14  SpO2 100% No data found.   Physical Exam  Constitutional: She is oriented to person, place, and time. She appears well-developed and well-nourished. No distress.  Musculoskeletal: Normal range of motion. She exhibits tenderness. She exhibits no edema.  Tight right calf muscle to palp and stretching, distal nvt intact, no edema.  Neurological: She is alert and oriented to person, place, and time.  Skin: Skin is warm and dry.  Nursing note and vitals reviewed.   ED Course  Procedures (including critical care time)  Labs  Review Labs Reviewed - No data to display  Imaging Review No results found.   Visual Acuity Review  Right Eye Distance:   Left Eye Distance:   Bilateral Distance:    Right Eye Near:   Left Eye Near:    Bilateral Near:         MDM   1. Strain of calf muscle, right, initial encounter        Billy Fischer, MD 02/06/16 2124

## 2016-03-22 DIAGNOSIS — H04123 Dry eye syndrome of bilateral lacrimal glands: Secondary | ICD-10-CM | POA: Diagnosis not present

## 2016-03-22 DIAGNOSIS — H40013 Open angle with borderline findings, low risk, bilateral: Secondary | ICD-10-CM | POA: Diagnosis not present

## 2016-03-22 DIAGNOSIS — H3589 Other specified retinal disorders: Secondary | ICD-10-CM | POA: Diagnosis not present

## 2016-03-22 DIAGNOSIS — H11423 Conjunctival edema, bilateral: Secondary | ICD-10-CM | POA: Diagnosis not present

## 2016-03-22 DIAGNOSIS — H21261 Iris atrophy (essential) (progressive), right eye: Secondary | ICD-10-CM | POA: Diagnosis not present

## 2016-03-22 DIAGNOSIS — H52223 Regular astigmatism, bilateral: Secondary | ICD-10-CM | POA: Diagnosis not present

## 2016-03-22 DIAGNOSIS — H11153 Pinguecula, bilateral: Secondary | ICD-10-CM | POA: Diagnosis not present

## 2016-03-22 DIAGNOSIS — H18413 Arcus senilis, bilateral: Secondary | ICD-10-CM | POA: Diagnosis not present

## 2016-03-22 DIAGNOSIS — H47233 Glaucomatous optic atrophy, bilateral: Secondary | ICD-10-CM | POA: Diagnosis not present

## 2016-04-15 DIAGNOSIS — H26492 Other secondary cataract, left eye: Secondary | ICD-10-CM | POA: Diagnosis not present

## 2016-04-15 DIAGNOSIS — Z961 Presence of intraocular lens: Secondary | ICD-10-CM | POA: Diagnosis not present

## 2016-05-05 DIAGNOSIS — Z1231 Encounter for screening mammogram for malignant neoplasm of breast: Secondary | ICD-10-CM | POA: Diagnosis not present

## 2016-05-05 DIAGNOSIS — H9201 Otalgia, right ear: Secondary | ICD-10-CM | POA: Diagnosis not present

## 2016-05-05 DIAGNOSIS — Z0000 Encounter for general adult medical examination without abnormal findings: Secondary | ICD-10-CM | POA: Diagnosis not present

## 2016-05-05 DIAGNOSIS — E559 Vitamin D deficiency, unspecified: Secondary | ICD-10-CM | POA: Diagnosis not present

## 2016-05-05 DIAGNOSIS — E782 Mixed hyperlipidemia: Secondary | ICD-10-CM | POA: Diagnosis not present

## 2016-05-05 DIAGNOSIS — Z23 Encounter for immunization: Secondary | ICD-10-CM | POA: Diagnosis not present

## 2016-05-05 DIAGNOSIS — I10 Essential (primary) hypertension: Secondary | ICD-10-CM | POA: Diagnosis not present

## 2016-08-06 ENCOUNTER — Encounter (HOSPITAL_COMMUNITY): Payer: Self-pay | Admitting: Emergency Medicine

## 2016-08-06 ENCOUNTER — Ambulatory Visit (HOSPITAL_COMMUNITY)
Admission: EM | Admit: 2016-08-06 | Discharge: 2016-08-06 | Disposition: A | Discharge status: Home / Self Care | Payer: Commercial Managed Care - HMO | Attending: Family Medicine | Admitting: Family Medicine

## 2016-08-06 DIAGNOSIS — J069 Acute upper respiratory infection, unspecified: Secondary | ICD-10-CM

## 2016-08-06 DIAGNOSIS — H6123 Impacted cerumen, bilateral: Secondary | ICD-10-CM | POA: Diagnosis not present

## 2016-08-06 MED ORDER — IPRATROPIUM BROMIDE 0.06 % NA SOLN: 2.0000 | Freq: Four times a day (QID) | NASAL | 1 refills | Status: DC

## 2016-08-06 NOTE — ED Triage Notes (Signed)
Patient reports intermittent, recurrent pain in ears and sometimes involves both ears.  Nose not stuffy, but occasionally runny nose.  Denies any decrease in hearing.

## 2016-08-06 NOTE — ED Provider Notes (Signed)
[facility]    CSN: [id number] Arrival date & time: 08/06/16  1206     History   Chief Complaint Chief Complaint  Patient presents with  . Otalgia  . Headache    HPI Dawn Rodriguez is a 80 y.o. female.   The history is provided by the patient.  Otalgia  Location:  Bilateral Behind ear:  No abnormality Quality:  Dull and pressure Severity:  Mild Onset quality:  Gradual Duration:  1 week Timing:  Intermittent Progression:  Waxing and waning Chronicity:  Recurrent Context comment:  Seen by lmd, told that cerumen accumulation. Associated symptoms: headaches   Associated symptoms: no congestion, no ear discharge, no fever and no rhinorrhea   Headache  Associated symptoms: ear pain   Associated symptoms: no congestion, no drainage and no fever     Past Medical History:  Diagnosis Date  . Arthritis   . Bilateral dry eyes   . Cataracts, bilateral   . Chronic headache   . GERD (gastroesophageal reflux disease)   . Hypertension   . TMJ syndrome     Patient Active Problem List   Diagnosis Date Noted  . Cataracts, bilateral   . GERD (gastroesophageal reflux disease)   . OSTEOARTHRITIS 10/04/2007  . INTERNAL HEMORRHOIDS 09/14/2007  . DIVERTICULOSIS, SIGMOID COLON 09/14/2007    Past Surgical History:  Procedure Laterality Date  . CATARACT EXTRACTION      OB History    No data available       Home Medications    Prior to Admission medications   Medication Sig Start Date End Date Taking? Authorizing Provider  amLODipine (NORVASC) 2.5 MG tablet Take 2.5 mg by mouth daily.    Historical Provider, MD  cephALEXin (KEFLEX) 500 MG capsule Take 1 capsule (500 mg total) by mouth 2 (two) times daily. Patient not taking: Reported on 08/06/2016 05/27/14   Everlene Balls, MD  cholecalciferol (VITAMIN D) 1000 UNITS tablet Take 1,000 Units by mouth daily.    Historical Provider, MD  COD LIVER OIL PO Take 1 capsule by mouth daily.    Historical Provider, MD    Garlic [id number] MG CAPS Take 1 capsule by mouth daily.    Historical Provider, MD  Glucosamine-Chondroitin (ARTHX SS PO) Take 2 tablets by mouth daily.    Historical Provider, MD  Glycerin-Polysorbate 80 (REFRESH DRY EYE THERAPY OP) Place 1-2 drops into both eyes 4 (four) times daily as needed (for dry eyes).     Historical Provider, MD  KRILL OIL OMEGA-3 PO Take 1 capsule by mouth daily.    Historical Provider, MD  Multiple Vitamins-Minerals (MULTIVITAMIN WITH MINERALS) tablet Take 1 tablet by mouth daily.    Historical Provider, MD  nystatin (MYCOSTATIN) 100000 UNIT/ML suspension Take 5 mLs (500,000 Units total) by mouth 4 (four) times daily. 03/22/15   Bjorn Pippin, PA-C  potassium gluconate 595 MG TABS Take 595 mg by mouth daily.    Historical Provider, MD  traMADol (ULTRAM) 50 MG tablet Take 0.5-1 tablets (25-50 mg total) by mouth every 12 (twelve) hours as needed. 11/06/14   Gregor Hams, MD  vitamin B-12 (CYANOCOBALAMIN) 1000 MCG tablet Take 1,000 mcg by mouth daily.    Historical Provider, MD    Family History Family History  Problem Relation Age of Onset  . Pneumonia Mother     Social History Social History  Substance Use Topics  . Smoking status: Never Smoker  . Smokeless tobacco: Never Used  . Alcohol use No  Allergies   Tobramycin   Review of Systems Review of Systems  Constitutional: Negative for chills and fever.  HENT: Positive for ear pain. Negative for congestion, ear discharge, postnasal drip and rhinorrhea.   Neurological: Positive for headaches.     Physical Exam Triage Vital Signs ED Triage Vitals  Enc Vitals Group     BP 08/06/16 1302 151/78     Pulse Rate 08/06/16 1302 78     Resp 08/06/16 1302 18     Temp 08/06/16 1302 98.4 F (36.9 C)     Temp Source 08/06/16 1302 Oral     SpO2 08/06/16 1302 98 %     Weight --      Height --      Head Circumference --      Peak Flow --      Pain Score 08/06/16 1301 10     Pain Loc --      Pain Edu? --       Excl. in [place]? --    No data found.   Updated Vital Signs BP 151/78 (BP Location: Right Arm)   Pulse 78   Temp 98.4 F (36.9 C) (Oral)   Resp 18   SpO2 98%   Visual Acuity Right Eye Distance:   Left Eye Distance:   Bilateral Distance:    Right Eye Near:   Left Eye Near:    Bilateral Near:     Physical Exam  Constitutional: She is oriented to person, place, and time. She appears well-developed and well-nourished. She appears distressed.  HENT:  Head: Normocephalic.  Nose: Nose normal.  Mouth/Throat: Oropharynx is clear and moist.  Cerumen impaction bilat, no tmj pain.   Eyes: EOM are normal. Pupils are equal, round, and reactive to light.  Neck: Normal range of motion. Neck supple.  Lymphadenopathy:    She has no cervical adenopathy.  Neurological: She is alert and oriented to person, place, and time.  Skin: Skin is warm and dry.  Nursing note and vitals reviewed.    UC Treatments / Results  Labs (all labs ordered are listed, but only abnormal results are displayed) Labs Reviewed - No data to display  EKG  EKG Interpretation None       Radiology No results found.  Procedures Procedures (including critical care time)  Medications Ordered in UC Medications - No data to display   Initial Impression / Assessment and Plan / UC Course  I have reviewed the triage vital signs and the nursing notes.  Pertinent labs & imaging results that were available during my care of the patient were reviewed by me and considered in my medical decision making (see chart for details).  Clinical Course    Sx signif relieved with ear irrig, exam nl.   Final Clinical Impressions(s) / UC Diagnoses   Final diagnoses:  None    New Prescriptions New Prescriptions   No medications on file     Billy Fischer, MD 08/06/16 1337

## 2016-08-06 NOTE — Discharge Instructions (Signed)
Drink plenty of fluids as discussed, use medicine as prescribed, and mucinex or delsym for cough. Return or see your doctor if further problems °

## 2016-08-18 ENCOUNTER — Other Ambulatory Visit: Payer: Self-pay | Admitting: Internal Medicine

## 2016-08-18 ENCOUNTER — Other Ambulatory Visit: Payer: Self-pay | Admitting: Nurse Practitioner

## 2016-08-18 DIAGNOSIS — Z1231 Encounter for screening mammogram for malignant neoplasm of breast: Secondary | ICD-10-CM

## 2016-09-26 ENCOUNTER — Ambulatory Visit: Payer: Medicare Other

## 2016-10-11 ENCOUNTER — Ambulatory Visit
Admission: RE | Admit: 2016-10-11 | Discharge: 2016-10-11 | Disposition: A | Discharge status: Home / Self Care | Payer: Commercial Managed Care - HMO | Source: Ambulatory Visit | Attending: Internal Medicine | Admitting: Internal Medicine

## 2016-10-11 DIAGNOSIS — Z1231 Encounter for screening mammogram for malignant neoplasm of breast: Secondary | ICD-10-CM

## 2017-01-02 DIAGNOSIS — I10 Essential (primary) hypertension: Secondary | ICD-10-CM | POA: Diagnosis not present

## 2017-01-02 DIAGNOSIS — Z6823 Body mass index (BMI) 23.0-23.9, adult: Secondary | ICD-10-CM | POA: Diagnosis not present

## 2017-01-02 DIAGNOSIS — F419 Anxiety disorder, unspecified: Secondary | ICD-10-CM | POA: Diagnosis not present

## 2017-01-02 DIAGNOSIS — K219 Gastro-esophageal reflux disease without esophagitis: Secondary | ICD-10-CM | POA: Diagnosis not present

## 2017-01-05 DIAGNOSIS — M549 Dorsalgia, unspecified: Secondary | ICD-10-CM | POA: Diagnosis not present

## 2017-01-05 DIAGNOSIS — M940 Chondrocostal junction syndrome [Tietze]: Secondary | ICD-10-CM | POA: Diagnosis not present

## 2017-01-12 DIAGNOSIS — Z86010 Personal history of colonic polyps: Secondary | ICD-10-CM | POA: Diagnosis not present

## 2017-01-12 DIAGNOSIS — Z1211 Encounter for screening for malignant neoplasm of colon: Secondary | ICD-10-CM | POA: Diagnosis not present

## 2017-01-12 DIAGNOSIS — Z800 Family history of malignant neoplasm of digestive organs: Secondary | ICD-10-CM | POA: Diagnosis not present

## 2017-01-12 DIAGNOSIS — K5730 Diverticulosis of large intestine without perforation or abscess without bleeding: Secondary | ICD-10-CM | POA: Diagnosis not present

## 2017-01-28 ENCOUNTER — Encounter (HOSPITAL_COMMUNITY): Payer: Self-pay | Admitting: Emergency Medicine

## 2017-01-28 DIAGNOSIS — R079 Chest pain, unspecified: Secondary | ICD-10-CM | POA: Diagnosis not present

## 2017-01-28 DIAGNOSIS — R05 Cough: Secondary | ICD-10-CM | POA: Diagnosis not present

## 2017-01-28 DIAGNOSIS — I10 Essential (primary) hypertension: Secondary | ICD-10-CM | POA: Diagnosis not present

## 2017-01-28 DIAGNOSIS — Z79899 Other long term (current) drug therapy: Secondary | ICD-10-CM | POA: Insufficient documentation

## 2017-01-28 DIAGNOSIS — J029 Acute pharyngitis, unspecified: Secondary | ICD-10-CM | POA: Insufficient documentation

## 2017-01-28 DIAGNOSIS — R0981 Nasal congestion: Secondary | ICD-10-CM | POA: Insufficient documentation

## 2017-01-28 NOTE — ED Triage Notes (Signed)
Pt c/o dry cough x 1 week with facial pressure and bilat ear pain, HA.  Pt as been using nasal spray BID with limited relief.  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 Pt also c/o abd pain today but she has not been eating well and takes meds for GERD. Pt reports granddaughter also recently had GI bug.

## 2017-01-29 ENCOUNTER — Emergency Department (HOSPITAL_COMMUNITY)
Admission: EM | Admit: 2017-01-29 | Discharge: 2017-01-29 | Disposition: A | Discharge status: Home / Self Care | Payer: Medicare PPO | Attending: Physician Assistant | Admitting: Physician Assistant

## 2017-01-29 ENCOUNTER — Emergency Department (HOSPITAL_COMMUNITY): Payer: Medicare PPO

## 2017-01-29 DIAGNOSIS — R0981 Nasal congestion: Secondary | ICD-10-CM

## 2017-01-29 DIAGNOSIS — R05 Cough: Secondary | ICD-10-CM | POA: Diagnosis not present

## 2017-01-29 DIAGNOSIS — R079 Chest pain, unspecified: Secondary | ICD-10-CM | POA: Diagnosis not present

## 2017-01-29 LAB — RAPID STREP SCREEN (MED CTR MEBANE ONLY): Streptococcus, Group A Screen (Direct): NEGATIVE

## 2017-01-29 MED ORDER — CETIRIZINE HCL 5 MG PO TABS: 10.0000 mg | ORAL_TABLET | Freq: Every day | ORAL | 0 refills | Status: DC

## 2017-01-29 MED ORDER — CETIRIZINE HCL 5 MG PO TABS: 5.0000 mg | ORAL_TABLET | Freq: Every day | ORAL | 0 refills | Status: DC

## 2017-01-29 NOTE — Discharge Instructions (Signed)
We think that this is likely sinus congestion or allergies given your complaints of fullness in your ears and sore throat. Please return your primary care physician this week if you continued to have symptoms.

## 2017-01-29 NOTE — ED Provider Notes (Signed)
[facility] DEPT Provider Note   CSN: [id number] Arrival date & time: 01/28/17  2153  By signing my name below, I, Dawn Rodriguez, attest that this documentation has been prepared under the direction and in the presence of Mackuen, [place], *. Electronically signed, Dawn Rodriguez, ED Scribe. 01/29/17. 3:31 AM.  History   Chief Complaint Chief Complaint  Patient presents with  . Cough    HPI HPI Comments: Dawn Rodriguez is a 81 y.o. female who presents to the Emergency Department complaining of persistent dry cough and sore throat, onset two weeks ago. Pt has been babysitting her grandchildren and states that they have been sick with a GI bug and believes she may have caught something from them. Her throat became scratchy a few days ago and has worsened. She also states that she has bad allergies but does not take any medication for them. Pt has been gargling with warm salt water with no relief. She denies any vomiting.   The history is provided by the patient. No language interpreter was used.   Past Medical History:  Diagnosis Date  . Arthritis   . Bilateral dry eyes   . Cataracts, bilateral   . Chronic headache   . GERD (gastroesophageal reflux disease)   . Hypertension   . TMJ syndrome     Patient Active Problem List   Diagnosis Date Noted  . Cataracts, bilateral   . GERD (gastroesophageal reflux disease)   . OSTEOARTHRITIS 10/04/2007  . INTERNAL HEMORRHOIDS 09/14/2007  . DIVERTICULOSIS, SIGMOID COLON 09/14/2007    Past Surgical History:  Procedure Laterality Date  . CATARACT EXTRACTION      OB History    No data available       Home Medications    Prior to Admission medications   Medication Sig Start Date End Date Taking? Authorizing Provider  amLODipine (NORVASC) 2.5 MG tablet Take 2.5 mg by mouth daily.    [provider]  cephALEXin (KEFLEX) 500 MG capsule Take 1 capsule (500 mg total) by mouth 2 (two) times daily. Patient not taking:  Reported on 08/06/2016 05/27/14   Everlene Balls, MD  cetirizine (ZYRTEC) 5 MG tablet Take 1 tablet (5 mg total) by mouth daily. 01/29/17   Mackuen, Courteney Lyn, MD  cholecalciferol (VITAMIN D) 1000 UNITS tablet Take 1,000 Units by mouth daily.    [provider]  COD LIVER OIL PO Take 1 capsule by mouth daily.    [provider]  Garlic 7253 MG CAPS Take 1 capsule by mouth daily.    [provider]  Glucosamine-Chondroitin (ARTHX SS PO) Take 2 tablets by mouth daily.    [provider]  Glycerin-Polysorbate 80 (REFRESH DRY EYE THERAPY OP) Place 1-2 drops into both eyes 4 (four) times daily as needed (for dry eyes).     [provider]  ipratropium (ATROVENT) 0.06 % nasal spray Place 2 sprays into both nostrils 4 (four) times daily. 08/06/16   Billy Fischer, MD  KRILL OIL OMEGA-3 PO Take 1 capsule by mouth daily.    [provider]  Multiple Vitamins-Minerals (MULTIVITAMIN WITH MINERALS) tablet Take 1 tablet by mouth daily.    [provider]  nystatin (MYCOSTATIN) 100000 UNIT/ML suspension Take 5 mLs (500,000 Units total) by mouth 4 (four) times daily. 03/22/15   Bjorn Pippin, PA-C  potassium gluconate 595 MG TABS Take 595 mg by mouth daily.    [provider]  traMADol (ULTRAM) 50 MG tablet Take 0.5-1 tablets (  25-50 mg total) by mouth every 12 (twelve) hours as needed. 11/06/14   Gregor Hams, MD  vitamin B-12 (CYANOCOBALAMIN) 1000 MCG tablet Take 1,000 mcg by mouth daily.    [provider]    Family History Family History  Problem Relation Age of Onset  . Pneumonia Mother     Social History Social History  Substance Use Topics  . Smoking status: Never Smoker  . Smokeless tobacco: Never Used  . Alcohol use No    Allergies   Tobramycin   Review of Systems Review of Systems  Constitutional: Negative for fever.  HENT: Positive for sore throat.   Respiratory: Positive for cough.     Gastrointestinal: Negative for nausea and vomiting.    Physical Exam Updated Vital Signs BP (!) 147/63   Pulse (!) 55   Temp 98.3 F (36.8 C) (Oral)   Resp 16   Ht 5\' 6"  (1.676 m)   Wt 65.8 kg (145 lb)   SpO2 100%   BMI 23.40 kg/m   Physical Exam  Constitutional: She is oriented to person, place, and time. She appears well-developed and well-nourished. No distress.  HENT:  Head: Normocephalic and atraumatic.  Difficult to visualize posterior oropharynx.  Neck: Normal range of motion.  Pulmonary/Chest: Effort normal and breath sounds normal. She has no wheezes. She has no rales.  Lungs CTA bilaterally.   Neurological: She is alert and oriented to person, place, and time.  Skin: Skin is warm and dry. She is not diaphoretic.  Psychiatric: She has a normal mood and affect. Judgment normal.  Nursing note and vitals reviewed.   ED Treatments / Results  DIAGNOSTIC STUDIES: Oxygen Saturation is 100% on RA, normal by my interpretation.  COORDINATION OF CARE: 3:31 AM-Discussed treatment plan with pt at bedside and pt agreed to plan.   Labs (all labs ordered are listed, but only abnormal results are displayed) Labs Reviewed  RAPID STREP SCREEN (NOT AT [facility])  CULTURE, GROUP A STREP [facility])    EKG  EKG Interpretation None       Radiology Dg Chest 2 View  Result Date: 01/29/2017 CLINICAL DATA:  Chest pain.  Cough for 1 week. EXAM: CHEST  2 VIEW COMPARISON:  Radiographs 03/10/2014 FINDINGS: The cardiomediastinal contours are unchanged with borderline cardiomegaly and atherosclerosis of the thoracic aorta. Streaky retrocardiac atelectasis. Pulmonary vasculature is normal. No consolidation, pleural effusion, or pneumothorax. No acute osseous abnormalities are seen. IMPRESSION: No acute abnormality.  Thoracic aortic atherosclerosis. Electronically Signed   By: Jeb Levering M.D.   On: 01/29/2017 04:43    Procedures Procedures (including critical care time)  Medications  Ordered in ED Medications - No data to display   Initial Impression / Assessment and Plan / ED Course  I have reviewed the triage vital signs and the nursing notes.  Pertinent labs & imaging results that were available during my care of the patient were reviewed by me and considered in my medical decision making (see chart for details).    I personally performed the services described in this documentation, which was scribed in my presence. The recorded information has been reviewed and is accurate.   Patietn is well appearing, 81 year old with sinus headache, sore throat on and off for 1 week. No n/v/d.  Appears well. Strep sent, xray ordered.   5:18 AM Normal results. We'll treat as allergies versus sinus virus. Patient was told by her PCP to get over-the-counter medications to help with allergies. We will give  her percscription  Patient is comfortable, ambulatory, and taking PO at time of discharge.  Patient expressed understanding about return precautions.    Final Clinical Impressions(s) / ED Diagnoses   Final diagnoses:  Sinus congestion    New Prescriptions Current Discharge Medication List    START taking these medications   Details  cetirizine (ZYRTEC) 5 MG tablet Take 1 tablet (5 mg total) by mouth daily. Qty: 30 tablet, Refills: 0          Mackuen, Courteney Lyn, MD 01/29/17 2072

## 2017-01-29 NOTE — ED Notes (Signed)
ED Provider at bedside. 

## 2017-01-31 LAB — CULTURE, GROUP A STREP (THRC)

## 2017-03-01 DIAGNOSIS — Z800 Family history of malignant neoplasm of digestive organs: Secondary | ICD-10-CM | POA: Diagnosis not present

## 2017-03-01 DIAGNOSIS — Z86010 Personal history of colonic polyps: Secondary | ICD-10-CM | POA: Diagnosis not present

## 2017-03-01 DIAGNOSIS — Z1211 Encounter for screening for malignant neoplasm of colon: Secondary | ICD-10-CM | POA: Diagnosis not present

## 2017-03-01 DIAGNOSIS — K5730 Diverticulosis of large intestine without perforation or abscess without bleeding: Secondary | ICD-10-CM | POA: Diagnosis not present

## 2017-05-11 DIAGNOSIS — E782 Mixed hyperlipidemia: Secondary | ICD-10-CM | POA: Diagnosis not present

## 2017-05-11 DIAGNOSIS — I10 Essential (primary) hypertension: Secondary | ICD-10-CM | POA: Diagnosis not present

## 2017-05-11 DIAGNOSIS — R5383 Other fatigue: Secondary | ICD-10-CM | POA: Diagnosis not present

## 2017-05-11 DIAGNOSIS — E559 Vitamin D deficiency, unspecified: Secondary | ICD-10-CM | POA: Diagnosis not present

## 2017-05-11 DIAGNOSIS — Z0000 Encounter for general adult medical examination without abnormal findings: Secondary | ICD-10-CM | POA: Diagnosis not present

## 2017-05-11 DIAGNOSIS — E2839 Other primary ovarian failure: Secondary | ICD-10-CM | POA: Diagnosis not present

## 2017-05-30 ENCOUNTER — Other Ambulatory Visit: Payer: Self-pay | Admitting: Internal Medicine

## 2017-05-30 DIAGNOSIS — E2839 Other primary ovarian failure: Secondary | ICD-10-CM

## 2017-06-21 DIAGNOSIS — H35371 Puckering of macula, right eye: Secondary | ICD-10-CM | POA: Diagnosis not present

## 2017-06-21 DIAGNOSIS — H5213 Myopia, bilateral: Secondary | ICD-10-CM | POA: Diagnosis not present

## 2017-06-21 DIAGNOSIS — H04123 Dry eye syndrome of bilateral lacrimal glands: Secondary | ICD-10-CM | POA: Diagnosis not present

## 2017-06-21 DIAGNOSIS — H21261 Iris atrophy (essential) (progressive), right eye: Secondary | ICD-10-CM | POA: Diagnosis not present

## 2017-06-21 DIAGNOSIS — H35373 Puckering of macula, bilateral: Secondary | ICD-10-CM | POA: Diagnosis not present

## 2017-06-21 DIAGNOSIS — H40003 Preglaucoma, unspecified, bilateral: Secondary | ICD-10-CM | POA: Diagnosis not present

## 2017-06-21 DIAGNOSIS — H52223 Regular astigmatism, bilateral: Secondary | ICD-10-CM | POA: Diagnosis not present

## 2017-06-21 DIAGNOSIS — Z9841 Cataract extraction status, right eye: Secondary | ICD-10-CM | POA: Diagnosis not present

## 2017-06-21 DIAGNOSIS — H40013 Open angle with borderline findings, low risk, bilateral: Secondary | ICD-10-CM | POA: Diagnosis not present

## 2017-07-21 ENCOUNTER — Ambulatory Visit
Admission: RE | Admit: 2017-07-21 | Discharge: 2017-07-21 | Disposition: A | Discharge status: Home / Self Care | Payer: Medicare PPO | Source: Ambulatory Visit | Attending: Internal Medicine | Admitting: Internal Medicine

## 2017-07-21 DIAGNOSIS — E2839 Other primary ovarian failure: Secondary | ICD-10-CM

## 2017-07-21 DIAGNOSIS — Z780 Asymptomatic menopausal state: Secondary | ICD-10-CM | POA: Diagnosis not present

## 2017-07-21 DIAGNOSIS — M810 Age-related osteoporosis without current pathological fracture: Secondary | ICD-10-CM | POA: Diagnosis not present

## 2017-09-22 DIAGNOSIS — F419 Anxiety disorder, unspecified: Secondary | ICD-10-CM | POA: Diagnosis not present

## 2017-09-22 DIAGNOSIS — N390 Urinary tract infection, site not specified: Secondary | ICD-10-CM | POA: Diagnosis not present

## 2017-09-22 DIAGNOSIS — H6122 Impacted cerumen, left ear: Secondary | ICD-10-CM | POA: Diagnosis not present

## 2017-09-22 DIAGNOSIS — H9209 Otalgia, unspecified ear: Secondary | ICD-10-CM | POA: Diagnosis not present

## 2017-10-19 DIAGNOSIS — N182 Chronic kidney disease, stage 2 (mild): Secondary | ICD-10-CM | POA: Diagnosis not present

## 2017-10-19 DIAGNOSIS — Z79899 Other long term (current) drug therapy: Secondary | ICD-10-CM | POA: Diagnosis not present

## 2017-10-19 DIAGNOSIS — M810 Age-related osteoporosis without current pathological fracture: Secondary | ICD-10-CM | POA: Diagnosis not present

## 2017-10-19 DIAGNOSIS — I129 Hypertensive chronic kidney disease with stage 1 through stage 4 chronic kidney disease, or unspecified chronic kidney disease: Secondary | ICD-10-CM | POA: Diagnosis not present

## 2017-10-19 DIAGNOSIS — R350 Frequency of micturition: Secondary | ICD-10-CM | POA: Diagnosis not present

## 2017-11-09 DIAGNOSIS — I129 Hypertensive chronic kidney disease with stage 1 through stage 4 chronic kidney disease, or unspecified chronic kidney disease: Secondary | ICD-10-CM | POA: Diagnosis not present

## 2017-11-09 DIAGNOSIS — N182 Chronic kidney disease, stage 2 (mild): Secondary | ICD-10-CM | POA: Diagnosis not present

## 2017-11-09 DIAGNOSIS — E782 Mixed hyperlipidemia: Secondary | ICD-10-CM | POA: Diagnosis not present

## 2017-11-09 LAB — BASIC METABOLIC PANEL
BUN: 10 (ref 4–21)
CREATININE: 0.8 (ref 0.5–1.1)
Glucose: 90
POTASSIUM: 4.3 (ref 3.4–5.3)
SODIUM: 144 (ref 137–147)

## 2017-11-09 LAB — LIPID PANEL
Cholesterol: 185 (ref 0–200)
HDL: 65 (ref 35–70)
LDL Cholesterol: 112
LDL/HDL RATIO: 1.7
TRIGLYCERIDES: 41 (ref 40–160)

## 2017-11-16 DIAGNOSIS — H18413 Arcus senilis, bilateral: Secondary | ICD-10-CM | POA: Diagnosis not present

## 2017-11-16 DIAGNOSIS — Z961 Presence of intraocular lens: Secondary | ICD-10-CM | POA: Diagnosis not present

## 2017-11-16 DIAGNOSIS — H1132 Conjunctival hemorrhage, left eye: Secondary | ICD-10-CM | POA: Diagnosis not present

## 2017-11-16 DIAGNOSIS — H35371 Puckering of macula, right eye: Secondary | ICD-10-CM | POA: Diagnosis not present

## 2017-11-16 DIAGNOSIS — H1851 Endothelial corneal dystrophy: Secondary | ICD-10-CM | POA: Diagnosis not present

## 2017-11-16 DIAGNOSIS — H40013 Open angle with borderline findings, low risk, bilateral: Secondary | ICD-10-CM | POA: Diagnosis not present

## 2017-11-16 DIAGNOSIS — H04123 Dry eye syndrome of bilateral lacrimal glands: Secondary | ICD-10-CM | POA: Diagnosis not present

## 2017-12-06 DIAGNOSIS — N760 Acute vaginitis: Secondary | ICD-10-CM | POA: Diagnosis not present

## 2017-12-06 DIAGNOSIS — R05 Cough: Secondary | ICD-10-CM | POA: Diagnosis not present

## 2018-01-22 ENCOUNTER — Other Ambulatory Visit: Payer: Self-pay | Admitting: Internal Medicine

## 2018-01-22 DIAGNOSIS — Z1231 Encounter for screening mammogram for malignant neoplasm of breast: Secondary | ICD-10-CM

## 2018-01-27 DIAGNOSIS — F419 Anxiety disorder, unspecified: Secondary | ICD-10-CM | POA: Diagnosis not present

## 2018-01-27 DIAGNOSIS — Z9842 Cataract extraction status, left eye: Secondary | ICD-10-CM | POA: Diagnosis not present

## 2018-01-27 DIAGNOSIS — Z6825 Body mass index (BMI) 25.0-25.9, adult: Secondary | ICD-10-CM | POA: Diagnosis not present

## 2018-01-27 DIAGNOSIS — I10 Essential (primary) hypertension: Secondary | ICD-10-CM | POA: Diagnosis not present

## 2018-01-27 DIAGNOSIS — H547 Unspecified visual loss: Secondary | ICD-10-CM | POA: Diagnosis not present

## 2018-01-27 DIAGNOSIS — K219 Gastro-esophageal reflux disease without esophagitis: Secondary | ICD-10-CM | POA: Diagnosis not present

## 2018-01-27 DIAGNOSIS — Z9841 Cataract extraction status, right eye: Secondary | ICD-10-CM | POA: Diagnosis not present

## 2018-02-12 ENCOUNTER — Ambulatory Visit: Payer: Medicare PPO

## 2018-02-19 DIAGNOSIS — C801 Malignant (primary) neoplasm, unspecified: Secondary | ICD-10-CM

## 2018-02-19 HISTORY — DX: Malignant (primary) neoplasm, unspecified: C80.1

## 2018-03-01 ENCOUNTER — Inpatient Hospital Stay: Admission: RE | Admit: 2018-03-01 | Payer: Medicare PPO | Source: Ambulatory Visit

## 2018-03-07 ENCOUNTER — Ambulatory Visit
Admission: RE | Admit: 2018-03-07 | Discharge: 2018-03-07 | Disposition: A | Discharge status: Home / Self Care | Payer: Medicare PPO | Source: Ambulatory Visit | Attending: Internal Medicine | Admitting: Internal Medicine

## 2018-03-07 DIAGNOSIS — Z1231 Encounter for screening mammogram for malignant neoplasm of breast: Secondary | ICD-10-CM

## 2018-03-08 ENCOUNTER — Other Ambulatory Visit: Payer: Self-pay | Admitting: Internal Medicine

## 2018-03-08 DIAGNOSIS — R928 Other abnormal and inconclusive findings on diagnostic imaging of breast: Secondary | ICD-10-CM

## 2018-03-14 ENCOUNTER — Ambulatory Visit
Admission: RE | Admit: 2018-03-14 | Discharge: 2018-03-14 | Disposition: A | Discharge status: Home / Self Care | Payer: Medicare PPO | Source: Ambulatory Visit | Attending: Internal Medicine | Admitting: Internal Medicine

## 2018-03-14 ENCOUNTER — Other Ambulatory Visit: Payer: Self-pay | Admitting: Internal Medicine

## 2018-03-14 DIAGNOSIS — R922 Inconclusive mammogram: Secondary | ICD-10-CM | POA: Diagnosis not present

## 2018-03-14 DIAGNOSIS — N6322 Unspecified lump in the left breast, upper inner quadrant: Secondary | ICD-10-CM | POA: Diagnosis not present

## 2018-03-14 DIAGNOSIS — R928 Other abnormal and inconclusive findings on diagnostic imaging of breast: Secondary | ICD-10-CM

## 2018-03-14 DIAGNOSIS — N6320 Unspecified lump in the left breast, unspecified quadrant: Secondary | ICD-10-CM

## 2018-03-14 DIAGNOSIS — C50212 Malignant neoplasm of upper-inner quadrant of left female breast: Secondary | ICD-10-CM | POA: Diagnosis not present

## 2018-03-14 HISTORY — PX: BREAST BIOPSY: SHX20

## 2018-03-16 ENCOUNTER — Telehealth: Payer: Self-pay | Admitting: Hematology and Oncology

## 2018-03-16 ENCOUNTER — Encounter: Payer: Self-pay | Admitting: *Deleted

## 2018-03-16 NOTE — Telephone Encounter (Signed)
Spoke with patient to confirm morning [facility] appointment for 7/31, packet mailed to patient

## 2018-03-20 ENCOUNTER — Other Ambulatory Visit: Payer: Self-pay | Admitting: *Deleted

## 2018-03-20 DIAGNOSIS — C50212 Malignant neoplasm of upper-inner quadrant of left female breast: Secondary | ICD-10-CM | POA: Insufficient documentation

## 2018-03-20 DIAGNOSIS — Z170 Estrogen receptor positive status [ER+]: Principal | ICD-10-CM

## 2018-03-21 ENCOUNTER — Ambulatory Visit: Payer: Medicare PPO | Admitting: Physical Therapy

## 2018-03-21 ENCOUNTER — Inpatient Hospital Stay: Payer: Medicare PPO

## 2018-03-21 ENCOUNTER — Encounter: Payer: Self-pay | Admitting: Hematology and Oncology

## 2018-03-21 ENCOUNTER — Ambulatory Visit
Admission: RE | Admit: 2018-03-21 | Discharge: 2018-03-21 | Disposition: A | Discharge status: Home / Self Care | Payer: Medicare PPO | Source: Ambulatory Visit | Attending: Radiation Oncology | Admitting: Radiation Oncology

## 2018-03-21 ENCOUNTER — Ambulatory Visit: Payer: Self-pay | Admitting: General Surgery

## 2018-03-21 ENCOUNTER — Inpatient Hospital Stay: Payer: Medicare PPO | Attending: Hematology and Oncology | Admitting: Hematology and Oncology

## 2018-03-21 ENCOUNTER — Encounter: Payer: Self-pay | Admitting: *Deleted

## 2018-03-21 DIAGNOSIS — Z170 Estrogen receptor positive status [ER+]: Secondary | ICD-10-CM | POA: Diagnosis not present

## 2018-03-21 DIAGNOSIS — C50212 Malignant neoplasm of upper-inner quadrant of left female breast: Secondary | ICD-10-CM

## 2018-03-21 DIAGNOSIS — Z803 Family history of malignant neoplasm of breast: Secondary | ICD-10-CM | POA: Diagnosis not present

## 2018-03-21 LAB — CBC WITH DIFFERENTIAL (CANCER CENTER ONLY)
BASOS PCT: 1 %
Basophils Absolute: 0 10*3/uL (ref 0.0–0.1)
EOS ABS: 0.2 10*3/uL (ref 0.0–0.5)
Eosinophils Relative: 5 %
HCT: 36.6 % (ref 34.8–46.6)
HEMOGLOBIN: 12.1 g/dL (ref 11.6–15.9)
LYMPHS ABS: 1.4 10*3/uL (ref 0.9–3.3)
Lymphocytes Relative: 40 %
MCH: 29.1 pg (ref 25.1–34.0)
MCHC: 33.1 g/dL (ref 31.5–36.0)
MCV: 87.9 fL (ref 79.5–101.0)
Monocytes Absolute: 0.4 10*3/uL (ref 0.1–0.9)
Monocytes Relative: 12 %
NEUTROS PCT: 42 %
Neutro Abs: 1.5 10*3/uL (ref 1.5–6.5)
Platelet Count: 247 10*3/uL (ref 145–400)
RBC: 4.17 MIL/uL (ref 3.70–5.45)
RDW: 14.9 % — ABNORMAL HIGH (ref 11.2–14.5)
WBC: 3.6 10*3/uL — AB (ref 3.9–10.3)

## 2018-03-21 LAB — CMP (CANCER CENTER ONLY)
ALBUMIN: 3.8 g/dL (ref 3.5–5.0)
ALK PHOS: 58 U/L (ref 38–126)
ALT: 9 U/L (ref 0–44)
AST: 14 U/L — ABNORMAL LOW (ref 15–41)
Anion gap: 6 (ref 5–15)
BUN: 13 mg/dL (ref 8–23)
CALCIUM: 9.6 mg/dL (ref 8.9–10.3)
CO2: 30 mmol/L (ref 22–32)
CREATININE: 0.84 mg/dL (ref 0.44–1.00)
Chloride: 109 mmol/L (ref 98–111)
GFR, Est AFR Am: >60 mL/min (ref >60)
GFR, Estimated: >60 mL/min (ref >60)
GLUCOSE: 106 mg/dL — AB (ref 70–99)
Potassium: 4 mmol/L (ref 3.5–5.1)
SODIUM: 145 mmol/L (ref 135–145)
Total Bilirubin: 0.5 mg/dL (ref 0.3–1.2)
Total Protein: 6.9 g/dL (ref 6.5–8.1)

## 2018-03-21 NOTE — Progress Notes (Signed)
[place] NOTE  Patient Care Team: Glendale Chard, MD as PCP - General (Internal Medicine) Excell Seltzer, MD as Consulting Physician (General Surgery) Nicholas Lose, MD as Consulting Physician (Hematology and Oncology) Gery Pray, MD as Consulting Physician (Radiation Oncology)  CHIEF COMPLAINTS/PURPOSE OF CONSULTATION:  Newly diagnosed breast cancer  HISTORY OF PRESENTING ILLNESS:  Dawn Rodriguez 82 y.o. female is here because of recent diagnosis of left breast cancer.  Patient had a routine screening mammogram that detected left breast mass at 9:30 position measuring 2.3 cm.  This was evaluated by ultrasound and a biopsy was performed.  The biopsy revealed invasive ductal carcinoma grade 2 that was ER PR positive HER-2 negative with a Ki-67 of 5%.  She was presented this morning in the multidisciplinary tumor board and she is here today to discuss her treatment plan at the [facility] clinic.  I reviewed her records extensively and collaborated the history with the patient.  SUMMARY OF ONCOLOGIC HISTORY:   Malignant neoplasm of upper-inner quadrant of left breast in female, estrogen receptor positive ([place])   03/14/2018 Initial Diagnosis    Screening detected left breast mass 2.3 cm at 9:30 position biopsy revealed invasive ductal carcinoma grade 2 ER 90%, PR 5%, Ki-67 5%, HER-2 negative ratio 1.19, axilla negative, T2N0 stage Ib clinical stage      03/21/2018 Cancer Staging    Staging form: Breast, AJCC 8th Edition - Clinical stage from 03/21/2018: Stage IB (cT2, cN0, cM0, G2, ER+, PR+, HER2-) - Signed by Nicholas Lose, MD on 03/21/2018        MEDICAL HISTORY:  Past Medical History:  Diagnosis Date  . Arthritis   . Bilateral dry eyes   . Cataracts, bilateral   . Chronic headache   . GERD (gastroesophageal reflux disease)   . Hypertension   . TMJ syndrome     SURGICAL HISTORY: Past Surgical History:  Procedure Laterality Date  . BREAST BIOPSY Right  2012   stereotatic biopsy  . CATARACT EXTRACTION      SOCIAL HISTORY: Social History   Socioeconomic History  . Marital status: Divorced    Spouse name: Not on file  . Number of children: Not on file  . Years of education: Not on file  . Highest education level: Not on file  Occupational History  . Not on file  Social Needs  . Financial resource strain: Not on file  . Food insecurity:    Worry: Not on file    Inability: Not on file  . Transportation needs:    Medical: Not on file    Non-medical: Not on file  Tobacco Use  . Smoking status: Never Smoker  . Smokeless tobacco: Never Used  Substance and Sexual Activity  . Alcohol use: No  . Drug use: No  . Sexual activity: Not Currently  Lifestyle  . Physical activity:    Days per week: Not on file    Minutes per session: Not on file  . Stress: Not on file  Relationships  . Social connections:    Talks on phone: Not on file    Gets together: Not on file    Attends religious service: Not on file    Active member of club or organization: Not on file    Attends meetings of clubs or organizations: Not on file    Relationship status: Not on file  . Intimate partner violence:    Fear of current or ex partner: Not on file  Emotionally abused: Not on file    Physically abused: Not on file    Forced sexual activity: Not on file  Other Topics Concern  . Not on file  Social History Narrative  . Not on file    FAMILY HISTORY: Family History  Problem Relation Age of Onset  . Pneumonia Mother   . Breast cancer Cousin     ALLERGIES:  is allergic to tobramycin.  MEDICATIONS:  Current Outpatient Medications  Medication Sig Dispense Refill  . amLODipine (NORVASC) 2.5 MG tablet Take 2.5 mg by mouth daily.    . cetirizine (ZYRTEC) 5 MG tablet Take 1 tablet (5 mg total) by mouth daily. (Patient taking differently: Take 5 mg by mouth as needed. ) 30 tablet 0  . cholecalciferol (VITAMIN D) 1000 UNITS tablet Take 1,000 Units  by mouth daily.    . Garlic 6503 MG CAPS Take 1 capsule by mouth daily.    . Glucosamine-Chondroitin (ARTHX SS PO) Take 2 tablets by mouth daily.    . Glycerin-Polysorbate 80 (REFRESH DRY EYE THERAPY OP) Place 1-2 drops into both eyes 4 (four) times daily as needed (for dry eyes).     Marland Kitchen ipratropium (ATROVENT) 0.06 % nasal spray Place 2 sprays into both nostrils 4 (four) times daily. 15 mL 1  . KRILL OIL OMEGA-3 PO Take 1 capsule by mouth daily.    . Magnesium 250 MG TABS Take by mouth daily.    . Multiple Vitamins-Minerals (MULTIVITAMIN WITH MINERALS) tablet Take 1 tablet by mouth daily.    . potassium gluconate 595 MG TABS Take 595 mg by mouth daily.    . vitamin B-12 (CYANOCOBALAMIN) 1000 MCG tablet Take 1,000 mcg by mouth daily.     No current facility-administered medications for this visit.     REVIEW OF SYSTEMS:   Constitutional: Denies fevers, chills or abnormal night sweats Eyes: Denies blurriness of vision, double vision or watery eyes Ears, nose, mouth, throat, and face: Denies mucositis or sore throat Respiratory: Denies cough, dyspnea or wheezes Cardiovascular: Denies palpitation, chest discomfort or lower extremity swelling Gastrointestinal:  Denies nausea, heartburn or change in bowel habits Skin: Denies abnormal skin rashes Lymphatics: Denies new lymphadenopathy or easy bruising Neurological:Denies numbness, tingling or new weaknesses Behavioral/Psych: Mood is stable, no new changes  Breast:  Denies any palpable lumps or discharge All other systems were reviewed with the patient and are negative.  PHYSICAL EXAMINATION: ECOG PERFORMANCE STATUS: 0 - Asymptomatic  Vitals:   03/21/18 0854  BP: (!) 154/66  Pulse: 72  Resp: 17  Temp: 98.1 F (36.7 C)  SpO2: 100%   Filed Weights   03/21/18 0854  Weight: 148 lb 3.2 oz (67.2 kg)    GENERAL:alert, no distress and comfortable SKIN: skin color, texture, turgor are normal, no rashes or significant lesions EYES:  normal, conjunctiva are pink and non-injected, sclera clear OROPHARYNX:no exudate, no erythema and lips, buccal mucosa, and tongue normal  NECK: supple, thyroid normal size, non-tender, without nodularity LYMPH:  no palpable lymphadenopathy in the cervical, axillary or inguinal LUNGS: clear to auscultation and percussion with normal breathing effort HEART: regular rate & rhythm and no murmurs and no lower extremity edema ABDOMEN:abdomen soft, non-tender and normal bowel sounds Musculoskeletal:no cyanosis of digits and no clubbing  PSYCH: alert & oriented x 3 with fluent speech NEURO: no focal motor/sensory deficits    LABORATORY DATA:  I have reviewed the data as listed Lab Results  Component Value Date   WBC 3.6 (L) 03/21/2018  HGB 12.1 03/21/2018   HCT 36.6 03/21/2018   MCV 87.9 03/21/2018   PLT 247 03/21/2018   Lab Results  Component Value Date   NA 145 03/21/2018   K 4.0 03/21/2018   CL 109 03/21/2018   CO2 30 03/21/2018    RADIOGRAPHIC STUDIES: I have personally reviewed the radiological reports and agreed with the findings in the report.  ASSESSMENT AND PLAN:  Malignant neoplasm of upper-inner quadrant of left breast in female, estrogen receptor positive ([place]) 03/14/2018:Screening detected left breast mass 2.3 cm at 9:30 position biopsy revealed invasive ductal carcinoma grade 2 ER 90%, PR 5%, Ki-67 5%, HER-2 negative ratio 1.19, axilla negative, T2N0 stage Ib clinical stage  Pathology and radiology counseling: Discussed with the patient, the details of pathology including the type of breast cancer,the clinical staging, the significance of ER, PR and HER-2/neu receptors and the implications for treatment. After reviewing the pathology in detail, we proceeded to discuss the different treatment options between surgery, radiation, chemotherapy, antiestrogen therapies.  Recommendation: 1.  Breast conserving surgery without sentinel lymph node study  2 adjuvant radiation  therapy.  3.  Followed by adjuvant antiestrogen therapy with letrozole 2.5 mg daily for 5 years.  Patient is agreeable with the plan and will return to see me after surgery to discuss adjuvant treatment plan.   All questions were answered. The patient knows to call the clinic with any problems, questions or concerns.    Harriette Ohara, MD 03/21/18

## 2018-03-21 NOTE — Assessment & Plan Note (Signed)
03/14/2018:Screening detected left breast mass 2.3 cm at 9:30 position biopsy revealed invasive ductal carcinoma grade 2 ER 90%, PR 5%, Ki-67 5%, HER-2 negative ratio 1.19, axilla negative, T2N0 stage Ib clinical stage  Pathology and radiology counseling: Discussed with the patient, the details of pathology including the type of breast cancer,the clinical staging, the significance of ER, PR and HER-2/neu receptors and the implications for treatment. After reviewing the pathology in detail, we proceeded to discuss the different treatment options between surgery, radiation, chemotherapy, antiestrogen therapies.  Recommendation: 1.  Breast conserving surgery without sentinel lymph node study  2 adjuvant radiation therapy.  3.  Followed by adjuvant antiestrogen therapy with letrozole 2.5 mg daily for 5 years.  Patient is agreeable with the plan and will return to see me after surgery to discuss adjuvant treatment plan.

## 2018-03-21 NOTE — Progress Notes (Signed)
Clinical Social Work [place] Psychosocial Distress Screening [place]  Patient completed distress screening protocol and scored a 5 on the Psychosocial Distress Thermometer which indicates moderate distress. Clinical Social Worker met with patient in [facility] to assess for distress and other psychosocial needs. Patient stated she was feeling overwhelmed but felt "better" after meeting with the treatment team and getting more information on her treatment plan. CSW and patient discussed common feeling and emotions when being diagnosed with cancer, and the importance of support during treatment. CSW informed patient of the support team and support services at [facility], and patient was agreeable to an [employer] referral. CSW provided contact information and encouraged patient to call with any questions or concerns.  ONCBCN DISTRESS SCREENING 03/21/2018  Screening Type Initial Screening  Distress experienced in past week (1-10) 5  Family Problem type Children  Emotional problem type Adjusting to illness  Referral to support programs Yes     Johnnye Lana, MSW, LCSW, OSW-C Clinical Social Worker [place] [phone]

## 2018-03-21 NOTE — Progress Notes (Signed)
Nutrition Assessment  Reason for Assessment:  Pt seen in Breast Clinic  ASSESSMENT:   82 year old female with new diagnosis of breast cancer.  Past medical history of HTN, arthritis, acid reflux.  Patient reports normal appetite  Medications:  reviewed  Labs: reviewed  Anthropometrics:   Height: 66 inches Weight: 148 lb 3.2 oz BMI: 23   NUTRITION DIAGNOSIS: Food and nutrition related knowledge deficit related to new diagnosis of breast cancer as evidenced by no prior need for nutrition related information.  INTERVENTION:   Discussed and provided packet of information regarding nutritional tips for breast cancer patients.  Questions answered.  Teachback method used.  Contact information provided and patient knows to contact me with questions/concerns.    MONITORING, EVALUATION, and GOAL: Pt will consume a healthy plant based diet to maintain lean body mass throughout treatment.   Dawn Rodriguez, [place], [place] Registered Dietitian [phone] (pager)

## 2018-03-21 NOTE — Progress Notes (Signed)
Radiation Oncology         [phone] ________________________________  Multidisciplinary Breast Oncology Clinic [facility]) Initial Outpatient Consultation  Name: Dawn Rodriguez MRN: [id number]  Date: 03/21/2018  DOB: 11-22-1935  EN:MMHWKGS, Dawn Mech, MD  Excell Seltzer, MD   REFERRING PHYSICIAN: Excell Seltzer, MD  DIAGNOSIS: Stage IB, cT2 Nx Mx, Left Breast, UIQ, Invasive Ductal Carcinoma, ER (+), PR (+), HER2 (neg), grade 2  Cancer Staging Malignant neoplasm of upper-inner quadrant of left breast in female, estrogen receptor positive ([place]) Staging form: Breast, AJCC 8th Edition - Clinical stage from 03/21/2018: Stage IB (cT2, cN0, cM0, G2, ER+, PR+, HER2-) - Unsigned   HISTORY OF PRESENT ILLNESS::Dawn Rodriguez is a 82 y.o. female who is presenting to the office today for evaluation of her newly diagnosed breast cancer. The patient thought this area was a cyst which she has had in the past.    She had routine screening mammography on 03/07/2018 showing a possible abnormality in the left breast. She underwent bilateral diagnostic mammography with tomography and left breast ultrasonography at [facility] on 03/14/2018 showing: Breast density category C. New suspicious mass in the left breast at 9:30.  Accordingly on 03/14/2018 she proceeded to biopsy of the left breast area in question. The pathology from this procedure showed: Breast, left, needle core biopsy, upper inner left 9:30 o'clock position with invasive ductal carcinoma. Prognostic indicators significant for: estrogen receptor, 90% positive with strong-moderate staining intensity and progesterone receptor, 5% positive, with strong staining intensity. Proliferation marker Ki67 at 5%. HER2 negative.  On review of systems, She reports hx of arthritis. She denies any other symptoms. She has a family hx of her paternal cousin with breast cancer.    Menarche: 41 years old Age at first live birth: 82 years old GP:  GxP2 LMP: at age 45 Contraceptive: Yes for 2 years HRT:    The patient was referred today for presentation in the multidisciplinary conference.  Radiology studies and pathology slides were presented there for review and discussion of treatment options.  A consensus was discussed regarding potential next steps.  PREVIOUS RADIATION THERAPY: No  PAST MEDICAL HISTORY:  has a past medical history of Arthritis, Bilateral dry eyes, Cataracts, bilateral, Chronic headache, GERD (gastroesophageal reflux disease), Hypertension, and TMJ syndrome.    PAST SURGICAL HISTORY: Past Surgical History:  Procedure Laterality Date  . BREAST BIOPSY Right 2012   stereotatic biopsy  . CATARACT EXTRACTION      FAMILY HISTORY: family history includes Breast cancer in her cousin; Pneumonia in her mother.  SOCIAL HISTORY:  reports that she has never smoked. She has never used smokeless tobacco. She reports that she does not drink alcohol or use drugs.  ALLERGIES: Tobramycin  MEDICATIONS:  Current Outpatient Medications  Medication Sig Dispense Refill  . amLODipine (NORVASC) 2.5 MG tablet Take 2.5 mg by mouth daily.    . cetirizine (ZYRTEC) 5 MG tablet Take 1 tablet (5 mg total) by mouth daily. (Patient taking differently: Take 5 mg by mouth as needed. ) 30 tablet 0  . cholecalciferol (VITAMIN D) 1000 UNITS tablet Take 1,000 Units by mouth daily.    . Garlic 8110 MG CAPS Take 1 capsule by mouth daily.    . Glucosamine-Chondroitin (ARTHX SS PO) Take 2 tablets by mouth daily.    . Glycerin-Polysorbate 80 (REFRESH DRY EYE THERAPY OP) Place 1-2 drops into both eyes 4 (four) times daily as needed (for dry eyes).     Marland Kitchen ipratropium (ATROVENT) 0.06 %  nasal spray Place 2 sprays into both nostrils 4 (four) times daily. 15 mL 1  . KRILL OIL OMEGA-3 PO Take 1 capsule by mouth daily.    . Magnesium 250 MG TABS Take by mouth daily.    . Multiple Vitamins-Minerals (MULTIVITAMIN WITH MINERALS) tablet Take 1 tablet by mouth  daily.    . potassium gluconate 595 MG TABS Take 595 mg by mouth daily.    . vitamin B-12 (CYANOCOBALAMIN) 1000 MCG tablet Take 1,000 mcg by mouth daily.     No current facility-administered medications for this encounter.     REVIEW OF SYSTEMS:  REVIEW OF SYSTEMS: A 10+ POINT REVIEW OF SYSTEMS WAS OBTAINED including neurology, dermatology, psychiatry, cardiac, respiratory, lymph, extremities, GI, GU, musculoskeletal, constitutional, reproductive, HEENT. All pertinent positives are noted in the HPI. All others are negative.   PHYSICAL EXAM:  Vitals with BMI 03/21/2018  Height 5' 6"  Weight 148 lbs 3 oz  BMI 40.08  Systolic 676  Diastolic 66  Pulse 72  Respirations 17    General: Alert and oriented, in no acute distress HEENT: Head is normocephalic. Extraocular movements are intact. Oropharynx is clear. Neck: Neck is supple, no palpable cervical or supraclavicular lymphadenopathy. Heart: Regular in rate and rhythm with no murmurs, rubs, or gallops. Chest: Clear to auscultation bilaterally, with no rhonchi, wheezes, or rales. Abdomen: Soft, nontender, nondistended, with no rigidity or guarding. Extremities: No cyanosis or edema. Lymphatics: see Neck Exam Skin: No concerning lesions. Musculoskeletal: symmetric strength and muscle tone throughout. Neurologic: Cranial nerves II through XII are grossly intact. No obvious focalities. Speech is fluent. Coordination is intact. Psychiatric: Judgment and insight are intact. Affect is appropriate. Breast: Right breast large and pendulous without no palpable mass, nipple discharge, or bleeding. Left breast with palpable mass in the medial aspect of breast approximately the 9-10 o'clock position that measures 2.5 x 2.5 cm in size. Patient has some steri strips and band aids in this area from recent biopsy. No nipple discharge or bleeding.     KPS = 100  100 - Normal; no complaints; no evidence of disease. 90   - Able to carry on normal  activity; minor signs or symptoms of disease. 80   - Normal activity with effort; some signs or symptoms of disease. 35   - Cares for self; unable to carry on normal activity or to do active work. 60   - Requires occasional assistance, but is able to care for most of his personal needs. 50   - Requires considerable assistance and frequent medical care. 40   - Disabled; requires special care and assistance. 89   - Severely disabled; hospital admission is indicated although death not imminent. 59   - Very sick; hospital admission necessary; active supportive treatment necessary. 10   - Moribund; fatal processes progressing rapidly. 0     - Dead  Karnofsky DA, Abelmann Garrison, Craver LS and [facility] [phone]) The use of the nitrogen mustards in the palliative treatment of carcinoma: with particular reference to bronchogenic carcinoma Cancer 1 634-56  LABORATORY DATA:  Lab Results  Component Value Date   WBC 3.6 (L) 03/21/2018   HGB 12.1 03/21/2018   HCT 36.6 03/21/2018   MCV 87.9 03/21/2018   PLT 247 03/21/2018   Lab Results  Component Value Date   NA 145 03/21/2018   K 4.0 03/21/2018   CL 109 03/21/2018   CO2 30 03/21/2018   Lab Results  Component Value Date   ALT  9 03/21/2018   AST 14 (L) 03/21/2018   ALKPHOS 58 03/21/2018   BILITOT 0.5 03/21/2018    RADIOGRAPHY: US Breast Ltd Uni Left Inc Axilla  Result Date: 03/14/2018 CLINICAL DATA:  The patient was called back for a new mass in the medial left breast. EXAM: DIGITAL DIAGNOSTIC left MAMMOGRAM WITH TOMO ULTRASOUND left BREAST COMPARISON:  Previous exam(s). ACR Breast Density Category c: The breast tissue is heterogeneously dense, which may obscure small masses. FINDINGS: The irregular mass in the medial inferior left breast persist. On physical exam, a palpable lump is felt in the medial left breast. Targeted ultrasound is performed, showing a solid irregular mass at 9:30, 9 cm from the nipple measuring 2.3 x 1.72.4 cm. No  axillary adenopathy. IMPRESSION: New suspicious mass in the left breast at 9:30. RECOMMENDATION: Recommend ultrasound-guided biopsy of the new left breast suspicious mass. I have discussed the findings and recommendations with the patient. Results were also provided in writing at the conclusion of the visit. If applicable, a reminder letter will be sent to the patient regarding the next appointment. BI-RADS CATEGORY  4: Suspicious. Electronically Signed   By: Dorise Bullion III M.D   On: 03/14/2018 15:44   Mm Diag Breast Tomo Uni Left  Result Date: 03/14/2018 CLINICAL DATA:  The patient was called back for a new mass in the medial left breast. EXAM: DIGITAL DIAGNOSTIC left MAMMOGRAM WITH TOMO ULTRASOUND left BREAST COMPARISON:  Previous exam(s). ACR Breast Density Category c: The breast tissue is heterogeneously dense, which may obscure small masses. FINDINGS: The irregular mass in the medial inferior left breast persist. On physical exam, a palpable lump is felt in the medial left breast. Targeted ultrasound is performed, showing a solid irregular mass at 9:30, 9 cm from the nipple measuring 2.3 x 1.72.4 cm. No axillary adenopathy. IMPRESSION: New suspicious mass in the left breast at 9:30. RECOMMENDATION: Recommend ultrasound-guided biopsy of the new left breast suspicious mass. I have discussed the findings and recommendations with the patient. Results were also provided in writing at the conclusion of the visit. If applicable, a reminder letter will be sent to the patient regarding the next appointment. BI-RADS CATEGORY  4: Suspicious. Electronically Signed   By: Dorise Bullion III M.D   On: 03/14/2018 15:44   Mm 3d Screen Breast Bilateral  Result Date: 03/07/2018 CLINICAL DATA:  Screening. EXAM: DIGITAL SCREENING BILATERAL MAMMOGRAM WITH TOMO AND CAD COMPARISON:  Previous exam(s). ACR Breast Density Category c: The breast tissue is heterogeneously dense, which may obscure small masses. FINDINGS: In  the left breast, a possible mass warrants further evaluation. In the right breast, no findings suspicious for malignancy. Images were processed with CAD. IMPRESSION: Further evaluation is suggested for possible mass in the left breast. RECOMMENDATION: Diagnostic mammogram and possibly ultrasound of the left breast. (Code:[id number]-[id number]) The patient will be contacted regarding the findings, and additional imaging will be scheduled. BI-RADS CATEGORY  0: Incomplete. Need additional imaging evaluation and/or prior mammograms for comparison. Electronically Signed   By: Lajean Manes M.D.   On: 03/07/2018 16:23   Mm Clip Placement Left  Result Date: 03/14/2018 CLINICAL DATA:  Evaluate clip placement following ultrasound-guided LEFT breast biopsy. EXAM: DIAGNOSTIC LEFT MAMMOGRAM POST ULTRASOUND BIOPSY COMPARISON:  Previous exam(s). FINDINGS: Mammographic images were obtained following ultrasound guided biopsy of the 2.4 cm mass at the 9:30 position of the LEFT breast. The RIBBON shaped clip is in satisfactory position. IMPRESSION: Satisfactory RIBBON clip position following ultrasound-guided LEFT breast biopsy. Final  Assessment: Post Procedure Mammograms for Marker Placement Electronically Signed   By: Margarette Canada M.D.   On: 03/14/2018 16:35   [place] Lt Breast Bx W Loc Dev 1st Lesion Img Bx Spec US Guide  Addendum Date: 03/15/2018   ADDENDUM REPORT: 03/15/2018 14:38 ADDENDUM: Pathology revealed GRADE II INVASIVE DUCTAL CARCINOMA of the Left breast, upper inner left 9:30 o'clock position. This was found to be concordant by Dr. Hassan Rowan. Pathology results were discussed with the patient by telephone. The patient reported doing well after the biopsy with tenderness at the site. Post biopsy instructions and care were reviewed and questions were answered. The patient was encouraged to call [facility] for any additional concerns. The patient was referred to [facility] on March 21, 2018. Pathology results reported by Terie Purser, RN on 03/15/2018. Electronically Signed   By: Margarette Canada M.D.   On: 03/15/2018 14:38   Result Date: 03/15/2018 CLINICAL DATA:  82 year old female for tissue sampling of UPPER INNER LEFT breast mass. EXAM: ULTRASOUND GUIDED LEFT BREAST CORE NEEDLE BIOPSY COMPARISON:  Previous exam(s). FINDINGS: I met with the patient and we discussed the procedure of ultrasound-guided biopsy, including benefits and alternatives. We discussed the high likelihood of a successful procedure. We discussed the risks of the procedure, including infection, bleeding, tissue injury, clip migration, and inadequate sampling. Informed written consent was given. The usual time-out protocol was performed immediately prior to the procedure. Using sterile technique and 1% Lidocaine as local anesthetic, under direct ultrasound visualization, a 12 gauge spring-loaded device was used to perform biopsy of the 2.4 cm hypoechoic mass at the 9:30 position of the LEFT breast 3 cm from the nipple using a MEDIAL approach. At the conclusion of the procedure a RIBBON shaped tissue marker clip was deployed into the biopsy cavity. Follow up 2 view mammogram was performed and dictated separately. IMPRESSION: Ultrasound guided biopsy of UPPER INNER LEFT breast mass. No apparent complications. Electronically Signed: By: Margarette Canada M.D. On: 03/14/2018 16:36      IMPRESSION: Stage IB, cT2 Nx Mx, Left Breast, UIQ, Invasive Ductal Carcinoma, ER (+), PR (+), HER2 (neg), grade 2   Patient will be a good candidate for breast conservation with radiotherapy to left breast. Given her advanced age we would not recommend SNL procedure in her situation.  Today, I talked to the patient  about the findings and work-up thus far. We discussed the natural history of breast cancer and general treatment, highlighting the role of radiotherapy in the management.  We  discussed the available radiation techniques, and focused on the details of logistics and delivery.  We reviewed the anticipated acute and late sequelae associated with radiation in this setting.  The patient was encouraged to ask questions that I answered to the best of my ability.     PLAN:   1. Left lumpectomy 2. Adjuvant Radiation therapy 3. Aromatase Inhibitor     ------------------------------------------------  Blair Promise, PhD, MD   This document serves as a record of services personally performed by Gery Pray, MD. It was created on his behalf by Steva Colder, a trained medical scribe. The creation of this record is based on the scribe's personal observations and the provider's statements to them. This document has been checked and approved by the attending provider.

## 2018-03-26 ENCOUNTER — Telehealth: Payer: Self-pay | Admitting: *Deleted

## 2018-03-26 DIAGNOSIS — Z170 Estrogen receptor positive status [ER+]: Principal | ICD-10-CM

## 2018-03-26 DIAGNOSIS — C50212 Malignant neoplasm of upper-inner quadrant of left female breast: Secondary | ICD-10-CM

## 2018-03-26 NOTE — Telephone Encounter (Signed)
Spoke to pt concerning Dawn Rodriguez from 7.31.19. Denies questions or concerns regarding dx or treatment care plan. Encourage pt to call with needs.

## 2018-03-28 ENCOUNTER — Telehealth: Payer: Self-pay | Admitting: Hematology and Oncology

## 2018-03-28 NOTE — Telephone Encounter (Signed)
Spoke to patient regarding upcoming sept appts per 8/5 sch message

## 2018-04-02 ENCOUNTER — Other Ambulatory Visit: Payer: Self-pay

## 2018-04-02 ENCOUNTER — Encounter (HOSPITAL_BASED_OUTPATIENT_CLINIC_OR_DEPARTMENT_OTHER): Payer: Self-pay | Admitting: *Deleted

## 2018-04-05 ENCOUNTER — Encounter (HOSPITAL_BASED_OUTPATIENT_CLINIC_OR_DEPARTMENT_OTHER)
Admission: RE | Admit: 2018-04-05 | Discharge: 2018-04-05 | Disposition: A | Discharge status: Home / Self Care | Payer: Medicare PPO | Source: Ambulatory Visit | Attending: General Surgery | Admitting: General Surgery

## 2018-04-05 DIAGNOSIS — Z170 Estrogen receptor positive status [ER+]: Secondary | ICD-10-CM | POA: Diagnosis not present

## 2018-04-05 DIAGNOSIS — C50912 Malignant neoplasm of unspecified site of left female breast: Secondary | ICD-10-CM | POA: Diagnosis present

## 2018-04-05 DIAGNOSIS — Z780 Asymptomatic menopausal state: Secondary | ICD-10-CM | POA: Diagnosis not present

## 2018-04-05 DIAGNOSIS — I10 Essential (primary) hypertension: Secondary | ICD-10-CM | POA: Diagnosis not present

## 2018-04-05 DIAGNOSIS — M1990 Unspecified osteoarthritis, unspecified site: Secondary | ICD-10-CM | POA: Diagnosis not present

## 2018-04-05 DIAGNOSIS — Z01810 Encounter for preprocedural cardiovascular examination: Secondary | ICD-10-CM | POA: Diagnosis not present

## 2018-04-05 DIAGNOSIS — K219 Gastro-esophageal reflux disease without esophagitis: Secondary | ICD-10-CM | POA: Diagnosis not present

## 2018-04-05 DIAGNOSIS — Z79899 Other long term (current) drug therapy: Secondary | ICD-10-CM | POA: Diagnosis not present

## 2018-04-05 DIAGNOSIS — D0512 Intraductal carcinoma in situ of left breast: Secondary | ICD-10-CM | POA: Diagnosis not present

## 2018-04-05 DIAGNOSIS — C50212 Malignant neoplasm of upper-inner quadrant of left female breast: Secondary | ICD-10-CM | POA: Diagnosis not present

## 2018-04-05 NOTE — H&P (Signed)
History of Present Illness Dawn Rodriguez T. Laderrick Wilk MD; 03/21/2018 10:37 AM) The patient is a 82 year old female who presents with breast cancer. Dawn Rodriguez is a post menopausal female referred by Dr. Melanee Spry for evaluation of recently diagnosed carcinoma of the left breast. Dawn Rodriguez recently presented for a screening mamogram revealing A new mass in the medial left breast. Dawn Rodriguez actually had been able to feel a lump for several months but Dawn Rodriguez thought it was a cyst that Dawn Rodriguez says was seen in her orried about it.US imaging and therefore was not worried about it.. Subsequent imaging included diagnostic mamogram showing an irregular mass in the medial left breast and ultrasound showing a 2.3 cm solid irregular mass at the 9:30 position medial left breast. An ultrasound guided breast biopsy was performed on 03/14/2018 with pathology revealing invasive ductal carcinoma of the breast. Dawn Rodriguez is seen now in [facility] for initial treatment planning. Dawn Rodriguez has experienced the palpable lump for several months as above. Dawn Rodriguez does not have a personal history of any previous breast problems.  Findings at that time were the following: Tumor size: 2.3 cm Tumor grade: 2, Ki-67 8% Estrogen Receptor: positive Progesterone Receptor: positive Her-2 neu: negative Lymph node status: negative    Past Surgical History Tawni Pummel, RN; 03/21/2018 7:36 AM) Breast Biopsy  Right. Cataract Surgery  Bilateral.  Diagnostic Studies History Tawni Pummel, RN; 03/21/2018 7:35 AM) Colonoscopy  never Mammogram  within last year Pap Smear  1-5 years ago  Medication History Tawni Pummel, RN; 03/21/2018 7:36 AM) Medications Reconciled  Social History Tawni Pummel, RN; 03/21/2018 7:36 AM) Caffeine use  Tea. No alcohol use  No drug use  Tobacco use  Never smoker.  Family History Tawni Pummel, RN; 03/21/2018 7:36 AM) Breast Cancer  Family Members In General. Respiratory Condition  Mother.  Pregnancy / Birth History Tawni Pummel, RN; 03/21/2018 7:36 AM) Age at menarche  65 years. Age of menopause  93-55 Gravida  2 Maternal age  75-25 Para  2 Regular periods   Other Problems Tawni Pummel, RN; 03/21/2018 7:36 AM) Arthritis  Diverticulosis  Gastroesophageal Reflux Disease  High blood pressure  Migraine Headache     Review of Systems Sunday Spillers Ledford RN; 03/21/2018 7:36 AM) General Not Present- Appetite Loss, Chills, Fatigue, Fever, Night Sweats, Weight Gain and Weight Loss. Skin Not Present- Change in Wart/Mole, Dryness, Hives, Jaundice, New Lesions, Non-Healing Wounds, Rash and Ulcer. HEENT Not Present- Earache, Hearing Loss, Hoarseness, Nose Bleed, Oral Ulcers, Ringing in the Ears, Seasonal Allergies, Sinus Pain, Sore Throat, Visual Disturbances, Wears glasses/contact lenses and Yellow Eyes. Respiratory Not Present- Bloody sputum, Chronic Cough, Difficulty Breathing, Snoring and Wheezing. Breast Not Present- Breast Mass, Breast Pain, Nipple Discharge and Skin Changes. Cardiovascular Not Present- Chest Pain, Difficulty Breathing Lying Down, Leg Cramps, Palpitations, Rapid Heart Rate, Shortness of Breath and Swelling of Extremities. Gastrointestinal Present- Hemorrhoids. Not Present- Abdominal Pain, Bloating, Bloody Stool, Change in Bowel Habits, Chronic diarrhea, Constipation, Difficulty Swallowing, Excessive gas, Gets full quickly at meals, Indigestion, Nausea, Rectal Pain and Vomiting. Female Genitourinary Not Present- Frequency, Nocturia, Painful Urination, Pelvic Pain and Urgency. Musculoskeletal Not Present- Back Pain, Joint Pain, Joint Stiffness, Muscle Pain, Muscle Weakness and Swelling of Extremities. Neurological Not Present- Decreased Memory, Fainting, Headaches, Numbness, Seizures, Tingling, Tremor, Trouble walking and Weakness. Psychiatric Not Present- Anxiety, Bipolar, Change in Sleep Pattern, Depression, Fearful and Frequent crying. Endocrine Not Present- Cold Intolerance, Excessive  Hunger, Hair Changes, Heat Intolerance, Hot flashes and New Diabetes. Hematology Not Present- Blood Thinners,  Easy Bruising, Excessive bleeding, Gland problems, HIV and Persistent Infections.   Physical Exam Dawn Rodriguez T. Mearl Olver MD; 03/21/2018 10:39 AM) The physical exam findings are as follows: Note:General: Alert, thin healthy-appearing African-American female, in no distress Skin: Warm and dry without rash or infection. HEENT: No palpable masses or thyromegaly. Sclera nonicteric. Pupils equal round and reactive. Lymph nodes: No cervical, supraclavicular, nodes palpable. Breasts: In the medial left breast is a very superficial firm freely movable palpable mass measuring about 2 cm. Lungs: Breath sounds clear and equal. No wheezing or increased work of breathing. Cardiovascular: Regular rate and rhythm without murmer. No JVD or edema. Abdomen: Nondistended. Soft and nontender. No masses palpable. No organomegaly. No palpable hernias. Extremities: No edema or joint swelling or deformity. No chronic venous stasis changes. Neurologic: Alert and fully oriented. Gait normal. No focal weakness. Psychiatric: Normal mood and affect. Thought content appropriate with normal judgement and insight    Assessment & Plan Dawn Rodriguez T. Clytie Shetley MD; 03/21/2018 10:44 AM) MALIGNANT NEOPLASM OF UPPER-INNER QUADRANT OF LEFT BREAST IN FEMALE, ESTROGEN RECEPTOR POSITIVE (W92.957) Impression: generally healthy 82 year old female with a new diagnosis of cancer of the left breast, upper inner quadrant. Clinical stage 1B, ER positive, PR positive, HER-2 negative. I discussed with the patient initial surgical treatment options. We discussed options of breast conservation with lumpectomy or total mastectomy. After discussion Dawn Rodriguez has elected to proceed with breast conservation with lumpectomy which I think would be an ideal choice. after discussion with medical and radiation oncology we would not plan a sentinel lymph node  biopsy due to age. We discussed the indications and nature of the procedure, and expected recovery, in detail. Surgical risks including anesthetic complications, cardiorespiratory complications, bleeding, infection, wound healing complications, blood clots, lymphedema, local and distant recurrence and possible need for further surgery based on the final pathology was discussed and understood. Chemotherapy, hormonal therapy and radiation therapy have been discussed. All questions were answered. Dawn Rodriguez understands and agree to proceed and we will go ahead with scheduling. Current Plans Left breast lumpectomy under general anesthesia as an outpatient

## 2018-04-05 NOTE — Progress Notes (Signed)
Ensure drink given to pt and she is to drink by 0415 on DOS.

## 2018-04-06 ENCOUNTER — Encounter (HOSPITAL_BASED_OUTPATIENT_CLINIC_OR_DEPARTMENT_OTHER)
Admission: RE | Disposition: A | Discharge status: Home / Self Care | Payer: Self-pay | Source: Ambulatory Visit | Attending: General Surgery

## 2018-04-06 ENCOUNTER — Other Ambulatory Visit: Payer: Self-pay

## 2018-04-06 ENCOUNTER — Encounter (HOSPITAL_BASED_OUTPATIENT_CLINIC_OR_DEPARTMENT_OTHER): Payer: Self-pay | Admitting: *Deleted

## 2018-04-06 ENCOUNTER — Ambulatory Visit (HOSPITAL_BASED_OUTPATIENT_CLINIC_OR_DEPARTMENT_OTHER): Payer: Medicare PPO | Admitting: Anesthesiology

## 2018-04-06 ENCOUNTER — Ambulatory Visit (HOSPITAL_BASED_OUTPATIENT_CLINIC_OR_DEPARTMENT_OTHER)
Admission: RE | Admit: 2018-04-06 | Discharge: 2018-04-06 | Disposition: A | Discharge status: Home / Self Care | Payer: Medicare PPO | Source: Ambulatory Visit | Attending: General Surgery | Admitting: General Surgery

## 2018-04-06 DIAGNOSIS — M1990 Unspecified osteoarthritis, unspecified site: Secondary | ICD-10-CM | POA: Diagnosis not present

## 2018-04-06 DIAGNOSIS — Z79899 Other long term (current) drug therapy: Secondary | ICD-10-CM | POA: Insufficient documentation

## 2018-04-06 DIAGNOSIS — C50212 Malignant neoplasm of upper-inner quadrant of left female breast: Secondary | ICD-10-CM | POA: Diagnosis not present

## 2018-04-06 DIAGNOSIS — K219 Gastro-esophageal reflux disease without esophagitis: Secondary | ICD-10-CM | POA: Diagnosis not present

## 2018-04-06 DIAGNOSIS — Z780 Asymptomatic menopausal state: Secondary | ICD-10-CM | POA: Diagnosis not present

## 2018-04-06 DIAGNOSIS — C50912 Malignant neoplasm of unspecified site of left female breast: Secondary | ICD-10-CM | POA: Diagnosis not present

## 2018-04-06 DIAGNOSIS — Z170 Estrogen receptor positive status [ER+]: Secondary | ICD-10-CM | POA: Insufficient documentation

## 2018-04-06 DIAGNOSIS — Z01810 Encounter for preprocedural cardiovascular examination: Secondary | ICD-10-CM | POA: Diagnosis not present

## 2018-04-06 DIAGNOSIS — D0512 Intraductal carcinoma in situ of left breast: Secondary | ICD-10-CM | POA: Insufficient documentation

## 2018-04-06 DIAGNOSIS — I10 Essential (primary) hypertension: Secondary | ICD-10-CM | POA: Insufficient documentation

## 2018-04-06 HISTORY — DX: Malignant (primary) neoplasm, unspecified: C80.1

## 2018-04-06 HISTORY — PX: BREAST LUMPECTOMY: SHX2

## 2018-04-06 SURGERY — BREAST LUMPECTOMY
Anesthesia: General | Site: Breast | Laterality: Left

## 2018-04-06 MED ORDER — BUPIVACAINE-EPINEPHRINE (PF) 0.5% -1:200000 IJ SOLN
INTRAMUSCULAR | Status: AC
  Filled 2018-04-06: qty 30

## 2018-04-06 MED ORDER — CELECOXIB 100 MG PO CAPS
100.0000 mg | ORAL_CAPSULE | Freq: Once | ORAL | Status: AC
  Administered 2018-04-06: 100 mg via ORAL

## 2018-04-06 MED ORDER — LIDOCAINE 2% (20 MG/ML) 5 ML SYRINGE
INTRAMUSCULAR | Status: AC
  Filled 2018-04-06: qty 5

## 2018-04-06 MED ORDER — GABAPENTIN 300 MG PO CAPS
ORAL_CAPSULE | ORAL | Status: AC
  Filled 2018-04-06: qty 1

## 2018-04-06 MED ORDER — ACETAMINOPHEN 500 MG PO TABS
ORAL_TABLET | ORAL | Status: AC
  Filled 2018-04-06: qty 2

## 2018-04-06 MED ORDER — DEXAMETHASONE SODIUM PHOSPHATE 10 MG/ML IJ SOLN
INTRAMUSCULAR | Status: AC
  Filled 2018-04-06: qty 1

## 2018-04-06 MED ORDER — CEFAZOLIN SODIUM-DEXTROSE 2-4 GM/100ML-% IV SOLN
2.0000 g | INTRAVENOUS | Status: AC
  Administered 2018-04-06: 2 g via INTRAVENOUS

## 2018-04-06 MED ORDER — FENTANYL CITRATE (PF) 100 MCG/2ML IJ SOLN: 25.0000 ug | INTRAMUSCULAR | Status: DC | PRN

## 2018-04-06 MED ORDER — CHLORHEXIDINE GLUCONATE CLOTH 2 % EX PADS: 6.0000 | MEDICATED_PAD | Freq: Once | CUTANEOUS | Status: DC

## 2018-04-06 MED ORDER — CEFAZOLIN SODIUM-DEXTROSE 2-4 GM/100ML-% IV SOLN
INTRAVENOUS | Status: AC
  Filled 2018-04-06: qty 100

## 2018-04-06 MED ORDER — CELECOXIB 100 MG PO CAPS
ORAL_CAPSULE | ORAL | Status: AC
  Filled 2018-04-06: qty 1

## 2018-04-06 MED ORDER — LACTATED RINGERS IV SOLN
INTRAVENOUS | Status: DC
  Administered 2018-04-06: 07:00:00 via INTRAVENOUS

## 2018-04-06 MED ORDER — BUPIVACAINE-EPINEPHRINE 0.5% -1:200000 IJ SOLN
INTRAMUSCULAR | Status: DC | PRN
  Administered 2018-04-06: 20 mL

## 2018-04-06 MED ORDER — ACETAMINOPHEN 500 MG PO TABS
1000.0000 mg | ORAL_TABLET | Freq: Once | ORAL | Status: AC
  Administered 2018-04-06: 1000 mg via ORAL

## 2018-04-06 MED ORDER — DEXAMETHASONE SODIUM PHOSPHATE 4 MG/ML IJ SOLN
INTRAMUSCULAR | Status: DC | PRN
  Administered 2018-04-06: 5 mg via INTRAVENOUS

## 2018-04-06 MED ORDER — ONDANSETRON HCL 4 MG/2ML IJ SOLN
INTRAMUSCULAR | Status: AC
Start: 2018-04-06 — End: ?
  Filled 2018-04-06: qty 2

## 2018-04-06 MED ORDER — LIDOCAINE 2% (20 MG/ML) 5 ML SYRINGE
INTRAMUSCULAR | Status: DC | PRN
  Administered 2018-04-06: 50 mg via INTRAVENOUS

## 2018-04-06 MED ORDER — PROPOFOL 500 MG/50ML IV EMUL
INTRAVENOUS | Status: AC
  Filled 2018-04-06: qty 50

## 2018-04-06 MED ORDER — PROPOFOL 10 MG/ML IV BOLUS
INTRAVENOUS | Status: AC
  Filled 2018-04-06: qty 20

## 2018-04-06 MED ORDER — SCOPOLAMINE 1 MG/3DAYS TD PT72: 1.0000 | MEDICATED_PATCH | Freq: Once | TRANSDERMAL | Status: DC | PRN

## 2018-04-06 MED ORDER — TRAMADOL HCL 50 MG PO TABS: 50.0000 mg | ORAL_TABLET | Freq: Four times a day (QID) | ORAL | 1 refills | Status: DC | PRN

## 2018-04-06 MED ORDER — FENTANYL CITRATE (PF) 100 MCG/2ML IJ SOLN
INTRAMUSCULAR | Status: AC
  Filled 2018-04-06: qty 2

## 2018-04-06 MED ORDER — FENTANYL CITRATE (PF) 100 MCG/2ML IJ SOLN
50.0000 ug | INTRAMUSCULAR | Status: DC | PRN
  Administered 2018-04-06 (×2): 50 ug via INTRAVENOUS

## 2018-04-06 MED ORDER — PROPOFOL 10 MG/ML IV BOLUS
INTRAVENOUS | Status: DC | PRN
  Administered 2018-04-06: 40 mg via INTRAVENOUS
  Administered 2018-04-06: 200 mg via INTRAVENOUS

## 2018-04-06 MED ORDER — MIDAZOLAM HCL 2 MG/2ML IJ SOLN: 1.0000 mg | INTRAMUSCULAR | Status: DC | PRN

## 2018-04-06 MED ORDER — GABAPENTIN 300 MG PO CAPS
300.0000 mg | ORAL_CAPSULE | Freq: Once | ORAL | Status: AC
  Administered 2018-04-06: 300 mg via ORAL

## 2018-04-06 SURGICAL SUPPLY — 42 items
BLADE SURG 15 STRL LF DISP TIS (BLADE) ×1 IMPLANT
BLADE SURG 15 STRL SS (BLADE) ×2
CANISTER SUCT 1200ML W/VALVE (MISCELLANEOUS) ×3 IMPLANT
CHLORAPREP W/TINT 26ML (MISCELLANEOUS) ×3 IMPLANT
CLIP VESOCCLUDE SM WIDE 6/CT (CLIP) IMPLANT
COVER BACK TABLE 60X90IN (DRAPES) ×3 IMPLANT
COVER MAYO STAND STRL (DRAPES) ×3 IMPLANT
DERMABOND ADVANCED (GAUZE/BANDAGES/DRESSINGS) ×2
DERMABOND ADVANCED .7 DNX12 (GAUZE/BANDAGES/DRESSINGS) ×1 IMPLANT
DEVICE DUBIN W/COMP PLATE 8390 (MISCELLANEOUS) ×3 IMPLANT
DRAPE LAPAROTOMY 100X72 PEDS (DRAPES) ×3 IMPLANT
DRAPE UTILITY XL STRL (DRAPES) ×3 IMPLANT
ELECT COATED BLADE 2.86 ST (ELECTRODE) ×3 IMPLANT
ELECT REM PT RETURN 9FT ADLT (ELECTROSURGICAL) ×3
ELECTRODE REM PT RTRN 9FT ADLT (ELECTROSURGICAL) ×1 IMPLANT
GLOVE BIO SURGEON STRL SZ 6.5 (GLOVE) ×2 IMPLANT
GLOVE BIO SURGEONS STRL SZ 6.5 (GLOVE) ×1
GLOVE BIOGEL PI IND STRL 7.0 (GLOVE) ×2 IMPLANT
GLOVE BIOGEL PI IND STRL 8 (GLOVE) ×1 IMPLANT
GLOVE BIOGEL PI INDICATOR 7.0 (GLOVE) ×4
GLOVE BIOGEL PI INDICATOR 8 (GLOVE) ×2
GLOVE ECLIPSE 7.5 STRL STRAW (GLOVE) ×3 IMPLANT
GOWN STRL REUS W/ TWL LRG LVL3 (GOWN DISPOSABLE) ×1 IMPLANT
GOWN STRL REUS W/ TWL XL LVL3 (GOWN DISPOSABLE) ×1 IMPLANT
GOWN STRL REUS W/TWL LRG LVL3 (GOWN DISPOSABLE) ×2
GOWN STRL REUS W/TWL XL LVL3 (GOWN DISPOSABLE) ×2
ILLUMINATOR WAVEGUIDE N/F (MISCELLANEOUS) IMPLANT
KIT MARKER MARGIN INK (KITS) ×3 IMPLANT
NEEDLE HYPO 25X1 1.5 SAFETY (NEEDLE) ×3 IMPLANT
NS IRRIG 1000ML POUR BTL (IV SOLUTION) IMPLANT
PACK BASIN DAY SURGERY FS (CUSTOM PROCEDURE TRAY) ×3 IMPLANT
PENCIL BUTTON HOLSTER BLD 10FT (ELECTRODE) ×3 IMPLANT
SLEEVE SCD COMPRESS KNEE MED (MISCELLANEOUS) ×3 IMPLANT
SUT MON AB 5-0 PS2 18 (SUTURE) ×3 IMPLANT
SUT VICRYL 3-0 CR8 SH (SUTURE) ×3 IMPLANT
SYR BULB 3OZ (MISCELLANEOUS) ×3 IMPLANT
SYR CONTROL 10ML LL (SYRINGE) ×3 IMPLANT
TOWEL GREEN STERILE FF (TOWEL DISPOSABLE) ×3 IMPLANT
TOWEL OR NON WOVEN STRL DISP B (DISPOSABLE) ×3 IMPLANT
TUBE CONNECTING 20'X1/4 (TUBING) ×1
TUBE CONNECTING 20X1/4 (TUBING) ×2 IMPLANT
YANKAUER SUCT BULB TIP NO VENT (SUCTIONS) IMPLANT

## 2018-04-06 NOTE — Anesthesia Postprocedure Evaluation (Signed)
Anesthesia Post Note  Patient: Dawn Rodriguez  Procedure(s) Performed: LEFT BREAST LUMPECTOMY (Left Breast)     Patient location during evaluation: PACU Anesthesia Type: General Level of consciousness: awake and alert Pain management: pain level controlled Vital Signs Assessment: post-procedure vital signs reviewed and stable Respiratory status: spontaneous breathing, nonlabored ventilation, respiratory function stable and patient connected to nasal cannula oxygen Cardiovascular status: blood pressure returned to baseline and stable Postop Assessment: no apparent nausea or vomiting Anesthetic complications: no    Last Vitals:  Vitals:   04/06/18 0900 04/06/18 0920  BP: 117/62 124/68  Pulse: 64 70  Resp: (!) 23 (!) 22  Temp:  36.4 C  SpO2: 96% 97%    Last Pain:  Vitals:   04/06/18 0920  TempSrc:   PainSc: 0-No pain                 Effie Berkshire

## 2018-04-06 NOTE — Op Note (Signed)
Preoperative Diagnosis: LEFT BREAST CANCER  Postoprative Diagnosis: LEFT BREAST CANCER  Procedure: Procedure(s): LEFT BREAST LUMPECTOMY   Surgeon: ,  T   Assistants: None  Anesthesia:  General LMA anesthesia  Indications: generally healthy 82 year old female with a new diagnosis of cancer of the left breast, upper inner quadrant. Clinical stage 1B, ER positive, PR positive, HER-2 negative.  After discussion detailed elsewhere we have elected to proceed with left breast lumpectomy as initial surgical therapy.   Procedure Detail: Patient was brought to the operating room, placed in the supine position on the operating table, and laryngeal mask general anesthesia induced.  The left breast was widely sterilely prepped and draped.  She received preoperative IV antibiotics.  PAS were in place.  Patient timeout was performed and correct procedure verified.  The mass which measured about 2-1/2 cm was easily palpable very superficial in the medial left breast.  I planned an elliptical skin excision overlying the mass as it was extremely superficial.  This was oriented transversely.  The incision was made and dissection carried down into subcutaneous tissue.  Using the palpable masses orientation a specimen of breast tissue with grossly normal margins was excised in all directions and deepened down toward the chest wall and the specimen removed.  The specimen was inked for margins and sent for permanent pathology.  It was grossly well contained.  Complete hemostasis was obtained in the wound.  Soft tissue was infiltrated with Marcaine.  Deep breast and subcutaneous tissue was closed with interrupted 3-0 Vicryl and the skin with running subcuticular 5-0 Monocryl and Dermabond.  Sponge needle and instrument counts were correct.    Findings: As above  Estimated Blood Loss:  Minimal         Drains: None  Blood Given: none          Specimens: Left breast lumpectomy         Complications:  * No complications entered in OR log *         Disposition: PACU - hemodynamically stable.         Condition: stable      

## 2018-04-06 NOTE — Anesthesia Procedure Notes (Signed)
Procedure Name: LMA Insertion Performed by: Rohith Fauth W, CRNA Pre-anesthesia Checklist: Patient identified, Emergency Drugs available, Suction available and Patient being monitored Patient Re-evaluated:Patient Re-evaluated prior to induction Oxygen Delivery Method: Circle system utilized Preoxygenation: Pre-oxygenation with 100% oxygen Induction Type: IV induction Ventilation: Mask ventilation without difficulty LMA: LMA inserted LMA Size: 4.0 Number of attempts: 1 Placement Confirmation: positive ETCO2 Tube secured with: Tape Dental Injury: Teeth and Oropharynx as per pre-operative assessment        

## 2018-04-06 NOTE — Interval H&P Note (Signed)
History and Physical Interval Note:  04/06/2018 7:27 AM  Dawn Rodriguez  has presented today for surgery, with the diagnosis of LEFT BREAST CANCER  The various methods of treatment have been discussed with the patient and family. After consideration of risks, benefits and other options for treatment, the patient has consented to  Procedure(s): LEFT BREAST LUMPECTOMY (Left) as a surgical intervention .  The patient's history has been reviewed, patient examined, no change in status, stable for surgery.  I have reviewed the patient's chart and labs.  Questions were answered to the patient's satisfaction.     [name] Brihanna Devenport

## 2018-04-06 NOTE — Transfer of Care (Signed)
Immediate Anesthesia Transfer of Care Note  Patient: Dawn Rodriguez  Procedure(s) Performed: LEFT BREAST LUMPECTOMY (Left Breast)  Patient Location: PACU  Anesthesia Type:General  Level of Consciousness: awake, alert  and oriented  Airway & Oxygen Therapy: Patient Spontanous Breathing and Patient connected to face mask oxygen  Post-op Assessment: Report given to RN and Post -op Vital signs reviewed and stable  Post vital signs: Reviewed and stable  Last Vitals:  Vitals Value Taken Time  BP    Temp    Pulse 77 04/06/2018  8:29 AM  Resp 18 04/06/2018  8:29 AM  SpO2 100 % 04/06/2018  8:29 AM  Vitals shown include unvalidated device data.  Last Pain:  Vitals:   04/06/18 8675  TempSrc: Oral  PainSc: 0-No pain         Complications: No apparent anesthesia complications

## 2018-04-06 NOTE — Anesthesia Preprocedure Evaluation (Addendum)
Anesthesia Evaluation  Patient identified by MRN, date of birth, ID band Patient awake    Reviewed: Allergy & Precautions, NPO status , Patient's Chart, lab work & pertinent test results  Airway Mallampati: II  TM Distance: >3 FB Neck ROM: Full    Dental  (+) Edentulous Upper, Edentulous Lower   Pulmonary neg pulmonary ROS,    breath sounds clear to auscultation       Cardiovascular hypertension,  Rhythm:Regular Rate:Normal     Neuro/Psych  Headaches, negative psych ROS   GI/Hepatic Neg liver ROS, GERD  ,  Endo/Other  negative endocrine ROS  Renal/GU negative Renal ROS     Musculoskeletal  (+) Arthritis ,   Abdominal Normal abdominal exam  (+)   Peds  Hematology negative hematology ROS (+)   Anesthesia Other Findings   Reproductive/Obstetrics                            Anesthesia Physical Anesthesia Plan  ASA: II  Anesthesia Plan: General   Post-op Pain Management:    Induction: Intravenous  PONV Risk Score and Plan: 4 or greater and Ondansetron, Dexamethasone and Treatment may vary due to age or medical condition  Airway Management Planned: LMA  Additional Equipment: None  Intra-op Plan:   Post-operative Plan: Extubation in OR  Informed Consent: I have reviewed the patients History and Physical, chart, labs and discussed the procedure including the risks, benefits and alternatives for the proposed anesthesia with the patient or authorized representative who has indicated his/her understanding and acceptance.     Plan Discussed with: CRNA  Anesthesia Plan Comments:        Anesthesia Quick Evaluation

## 2018-04-06 NOTE — Discharge Instructions (Signed)
[facility] °Office Phone Number [phone] ° °BREAST BIOPSY/ LUMPECTOMY: POST OP INSTRUCTIONS ° °Always review your discharge instruction sheet given to you by the facility where your surgery was performed. ° °IF YOU HAVE DISABILITY OR FAMILY LEAVE FORMS, YOU MUST BRING THEM TO THE OFFICE FOR PROCESSING.  DO NOT GIVE THEM TO YOUR DOCTOR. ° °1. A prescription for pain medication may be given to you upon discharge.  Take your pain medication as prescribed, if needed.  If narcotic pain medicine is not needed, then you may take acetaminophen (Tylenol) or ibuprofen (Advil) as needed. °2. Take your usually prescribed medications unless otherwise directed °3. If you need a refill on your pain medication, please contact your pharmacy.  They will contact our office to request authorization.  Prescriptions will not be filled after 5pm or on week-ends. °4. You should eat very light the first 24 hours after surgery, such as soup, crackers, pudding, etc.  Resume your normal diet the day after surgery. °5. Most patients will experience some swelling and bruising in the breast.  Ice packs and a good support bra will help.  Swelling and bruising can take several days to resolve.  °6. It is common to experience some constipation if taking pain medication after surgery.  Increasing fluid intake and taking a stool softener will usually help or prevent this problem from occurring.  A mild laxative (Milk of Magnesia or Miralax) should be taken according to package directions if there are no bowel movements after 48 hours. °7. Unless discharge instructions indicate otherwise, you may remove your bandages 24-48 hours after surgery, and you may shower at that time.  You may have steri-strips (small skin tapes) in place directly over the incision.  These strips should be left on the skin for 7-10 days.  If your surgeon used skin glue on the incision, you may shower in 24 hours.  The glue will flake off over the next 2-3  weeks.  Any sutures or staples will be removed at the office during your follow-up visit. °8. ACTIVITIES:  You may resume regular daily activities (gradually increasing) beginning the next day.  Wearing a good support bra or sports bra minimizes pain and swelling.  You may have sexual intercourse when it is comfortable. °a. You may drive when you no longer are taking prescription pain medication, you can comfortably wear a seatbelt, and you can safely maneuver your car and apply brakes. °b. RETURN TO WORK:  ______________________________________________________________________________________ °9. You should see your doctor in the office for a follow-up appointment approximately two weeks after your surgery.  Your doctor’s nurse will typically make your follow-up appointment when she calls you with your pathology report.  Expect your pathology report 2-3 business days after your surgery.  You may call to check if you do not hear from us after three days. °10. OTHER INSTRUCTIONS: _______________________________________________________________________________________________ _____________________________________________________________________________________________________________________________________ °_____________________________________________________________________________________________________________________________________ °_____________________________________________________________________________________________________________________________________ ° °WHEN TO CALL YOUR DOCTOR: °1. Fever over 101.0 °2. Nausea and/or vomiting. °3. Extreme swelling or bruising. °4. Continued bleeding from incision. °5. Increased pain, redness, or drainage from the incision. ° °The clinic staff is available to answer your questions during regular business hours.  Please don’t hesitate to call and ask to speak to one of the nurses for clinical concerns.  If you have a medical emergency, go to the nearest emergency  room or call 911.  A surgeon from [facility] is always on call at the hospital. ° °For further questions, please visit [email or web address]  ° °  Post Anesthesia Home Care Instructions ° °Activity: °Get plenty of rest for the remainder of the day. A responsible individual must stay with you for 24 hours following the procedure.  °For the next 24 hours, DO NOT: °-Drive a car °-Operate machinery °-Drink alcoholic beverages °-Take any medication unless instructed by your physician °-Make any legal decisions or sign important papers. ° °Meals: °Start with liquid foods such as gelatin or soup. Progress to regular foods as tolerated. Avoid greasy, spicy, heavy foods. If nausea and/or vomiting occur, drink only clear liquids until the nausea and/or vomiting subsides. Call your physician if vomiting continues. ° °Special Instructions/Symptoms: °Your throat may feel dry or sore from the anesthesia or the breathing tube placed in your throat during surgery. If this causes discomfort, gargle with warm salt water. The discomfort should disappear within 24 hours. ° °If you had a scopolamine patch placed behind your ear for the management of post- operative nausea and/or vomiting: ° °1. The medication in the patch is effective for 72 hours, after which it should be removed.  Wrap patch in a tissue and discard in the trash. Wash hands thoroughly with soap and water. °2. You may remove the patch earlier than 72 hours if you experience unpleasant side effects which may include dry mouth, dizziness or visual disturbances. °3. Avoid touching the patch. Wash your hands with soap and water after contact with the patch. °  ° ° °

## 2018-04-09 ENCOUNTER — Encounter (HOSPITAL_BASED_OUTPATIENT_CLINIC_OR_DEPARTMENT_OTHER): Payer: Self-pay | Admitting: General Surgery

## 2018-04-10 ENCOUNTER — Emergency Department (HOSPITAL_COMMUNITY): Payer: Medicare PPO

## 2018-04-10 ENCOUNTER — Encounter (HOSPITAL_COMMUNITY): Payer: Self-pay | Admitting: Emergency Medicine

## 2018-04-10 ENCOUNTER — Other Ambulatory Visit: Payer: Self-pay

## 2018-04-10 ENCOUNTER — Emergency Department (HOSPITAL_COMMUNITY)
Admission: EM | Admit: 2018-04-10 | Discharge: 2018-04-10 | Disposition: A | Discharge status: Home / Self Care | Payer: Medicare PPO | Attending: Emergency Medicine | Admitting: Emergency Medicine

## 2018-04-10 DIAGNOSIS — Z79899 Other long term (current) drug therapy: Secondary | ICD-10-CM | POA: Insufficient documentation

## 2018-04-10 DIAGNOSIS — R5383 Other fatigue: Secondary | ICD-10-CM

## 2018-04-10 DIAGNOSIS — G8929 Other chronic pain: Secondary | ICD-10-CM | POA: Diagnosis not present

## 2018-04-10 DIAGNOSIS — R42 Dizziness and giddiness: Secondary | ICD-10-CM | POA: Diagnosis not present

## 2018-04-10 DIAGNOSIS — R531 Weakness: Secondary | ICD-10-CM | POA: Diagnosis present

## 2018-04-10 DIAGNOSIS — R51 Headache: Secondary | ICD-10-CM | POA: Insufficient documentation

## 2018-04-10 DIAGNOSIS — I10 Essential (primary) hypertension: Secondary | ICD-10-CM | POA: Insufficient documentation

## 2018-04-10 DIAGNOSIS — R079 Chest pain, unspecified: Secondary | ICD-10-CM | POA: Diagnosis not present

## 2018-04-10 LAB — URINALYSIS, ROUTINE W REFLEX MICROSCOPIC
BILIRUBIN URINE: NEGATIVE
Glucose, UA: NEGATIVE mg/dL
Hgb urine dipstick: NEGATIVE
KETONES UR: NEGATIVE mg/dL
LEUKOCYTES UA: NEGATIVE
Nitrite: NEGATIVE
PH: 8 (ref 5.0–8.0)
PROTEIN: NEGATIVE mg/dL
Specific Gravity, Urine: 1.005 (ref 1.005–1.030)

## 2018-04-10 LAB — BASIC METABOLIC PANEL
Anion gap: 9 (ref 5–15)
BUN: 11 mg/dL (ref 8–23)
CALCIUM: 9.6 mg/dL (ref 8.9–10.3)
CO2: 29 mmol/L (ref 22–32)
CREATININE: 0.72 mg/dL (ref 0.44–1.00)
Chloride: 105 mmol/L (ref 98–111)
GFR calc Af Amer: >60 mL/min (ref >60)
GLUCOSE: 109 mg/dL — AB (ref 70–99)
Potassium: 3.9 mmol/L (ref 3.5–5.1)
Sodium: 143 mmol/L (ref 135–145)

## 2018-04-10 LAB — CBC
HEMATOCRIT: 38.6 % (ref 36.0–46.0)
Hemoglobin: 12.6 g/dL (ref 12.0–15.0)
MCH: 28.8 pg (ref 26.0–34.0)
MCHC: 32.6 g/dL (ref 30.0–36.0)
MCV: 88.1 fL (ref 78.0–100.0)
PLATELETS: 254 10*3/uL (ref 150–400)
RBC: 4.38 MIL/uL (ref 3.87–5.11)
RDW: 14.3 % (ref 11.5–15.5)
WBC: 4.1 10*3/uL (ref 4.0–10.5)

## 2018-04-10 LAB — CBG MONITORING, ED: Glucose-Capillary: 86 mg/dL (ref 70–99)

## 2018-04-10 MED ORDER — ONDANSETRON HCL 4 MG/2ML IJ SOLN
4.0000 mg | Freq: Once | INTRAMUSCULAR | Status: AC
  Administered 2018-04-10: 4 mg via INTRAVENOUS
  Filled 2018-04-10: qty 2

## 2018-04-10 MED ORDER — LACTATED RINGERS IV BOLUS
1000.0000 mL | Freq: Once | INTRAVENOUS | Status: AC
  Administered 2018-04-10: 1000 mL via INTRAVENOUS

## 2018-04-10 NOTE — ED Notes (Signed)
RN placed pt eye drops on pt bedside table prior to going to bathroom. Pt called RN into room stating her eye drops are missing. RN had not been in room since pt got back. Pt family in room entire time and stated no one else came into room and they don't know what happened to them. Will continue to monitor.

## 2018-04-10 NOTE — ED Provider Notes (Signed)
[facility] EMERGENCY DEPARTMENT Provider Note   CSN: [id number] Arrival date & time: 04/10/18  1026     History   Chief Complaint Chief Complaint  Patient presents with  . Dizziness    HPI Dawn Rodriguez is a 82 y.o. female.  Patient here with general weakness following surgery several days ago for breast cancer removal.  Patient states that ever since surgery she has had some generalized weakness and has had decreased appetite.  She denies any fever, chills.  No chest pain, no shortness of breath.  No falls.  Has had some nausea.  Patient denies any cardiac history and is not undergoing any kind of chemotherapy at this time.  The history is provided by the patient.  Illness  This is a new problem. The current episode started more than 2 days ago. The problem occurs daily. Progression since onset: waxing and waning. Pertinent negatives include no chest pain, no abdominal pain, no headaches and no shortness of breath. Nothing aggravates the symptoms. Nothing relieves the symptoms. She has tried nothing for the symptoms. The treatment provided no relief.    Past Medical History:  Diagnosis Date  . Arthritis   . Bilateral dry eyes   . Cancer ([place]) 02/2018   left breast cancer  . Cataracts, bilateral   . Chronic headache   . GERD (gastroesophageal reflux disease)   . Hypertension   . TMJ syndrome     Patient Active Problem List   Diagnosis Date Noted  . Malignant neoplasm of upper-inner quadrant of left breast in female, estrogen receptor positive ([place]) 03/20/2018  . Cataracts, bilateral   . GERD (gastroesophageal reflux disease)   . OSTEOARTHRITIS 10/04/2007  . INTERNAL HEMORRHOIDS 09/14/2007  . DIVERTICULOSIS, SIGMOID COLON 09/14/2007    Past Surgical History:  Procedure Laterality Date  . BREAST BIOPSY Right 2012   stereotatic biopsy  . BREAST LUMPECTOMY Left 04/06/2018   Procedure: LEFT BREAST LUMPECTOMY;  Surgeon: Excell Seltzer, MD;   Location: [facility];  Service: General;  Laterality: Left;  . CATARACT EXTRACTION       OB History   None      Home Medications    Prior to Admission medications   Medication Sig Start Date End Date Taking? Authorizing Provider  amLODipine (NORVASC) 2.5 MG tablet Take 2.5 mg by mouth daily.   Yes [provider]  calcium carbonate (TUMS - DOSED IN MG ELEMENTAL CALCIUM) 500 MG chewable tablet Chew 1 tablet by mouth daily as needed for indigestion or heartburn.   Yes [provider]  cholecalciferol (VITAMIN D) 1000 UNITS tablet Take 1,000 Units by mouth daily.   Yes [provider]  Garlic 4259 MG CAPS Take 1,000 mg by mouth daily.    Yes [provider]  Glucosamine-Chondroitin (ARTHX SS PO) Take 2 tablets by mouth daily.   Yes [provider]  Glycerin-Polysorbate 80 (REFRESH DRY EYE THERAPY OP) Place 1-2 drops into both eyes 4 (four) times daily as needed (for dry eyes).    Yes [provider]  ipratropium (ATROVENT) 0.06 % nasal spray Place 2 sprays into both nostrils 4 (four) times daily. 08/06/16  Yes Kindl, Nelda Severe, MD  KRILL OIL OMEGA-3 PO Take 1 capsule by mouth daily.   Yes [provider]  Magnesium 250 MG TABS Take 250 mg by mouth daily.    Yes [provider]  Multiple Vitamins-Minerals (MULTIVITAMIN WITH MINERALS) tablet Take 1 tablet by mouth daily.  Yes [provider]  omeprazole (PRILOSEC) 20 MG capsule Take 20 mg by mouth daily.   Yes [provider]  potassium gluconate 595 MG TABS Take 595 mg by mouth daily.   Yes [provider]  vitamin B-12 (CYANOCOBALAMIN) 1000 MCG tablet Take 1,000 mcg by mouth daily.   Yes [provider]  traMADol (ULTRAM) 50 MG tablet Take 1 tablet (50 mg total) by mouth every 6 (six) hours as needed. Patient not taking: Reported on 04/10/2018 04/06/18   Excell Seltzer, MD    Family History Family History  Problem  Relation Age of Onset  . Pneumonia Mother   . Breast cancer Cousin     Social History Social History   Tobacco Use  . Smoking status: Never Smoker  . Smokeless tobacco: Never Used  Substance Use Topics  . Alcohol use: No  . Drug use: No     Allergies   Tobramycin   Review of Systems Review of Systems  Constitutional: Positive for appetite change and fatigue. Negative for chills and fever.  HENT: Negative for ear pain and sore throat.   Eyes: Negative for pain and visual disturbance.  Respiratory: Negative for cough and shortness of breath.   Cardiovascular: Negative for chest pain and palpitations.  Gastrointestinal: Negative for abdominal pain and vomiting.  Genitourinary: Negative for dysuria and hematuria.  Musculoskeletal: Negative for arthralgias and back pain.  Skin: Negative for color change and rash.  Neurological: Negative for seizures, syncope and headaches.  All other systems reviewed and are negative.    Physical Exam Updated Vital Signs  ED Triage Vitals  Enc Vitals Group     BP 04/10/18 1033 133/69     Pulse Rate 04/10/18 1033 74     Resp 04/10/18 1033 16     Temp 04/10/18 1033 98 F (36.7 C)     Temp Source 04/10/18 1033 Oral     SpO2 04/10/18 1033 100 %     Weight --      Height --      Head Circumference --      Peak Flow --      Pain Score 04/10/18 1047 0     Pain Loc --      Pain Edu? --      Excl. in [place]? --     Physical Exam  Constitutional: She is oriented to person, place, and time. She appears well-developed and well-nourished. No distress.  HENT:  Head: Normocephalic and atraumatic.  Eyes: Pupils are equal, round, and reactive to light. Conjunctivae and EOM are normal.  Neck: Normal range of motion. Neck supple.  Cardiovascular: Normal rate, regular rhythm, normal heart sounds and intact distal pulses.  No murmur heard. Pulmonary/Chest: Effort normal and breath sounds normal. No respiratory distress.  Abdominal: Soft. There  is no tenderness.  Musculoskeletal: Normal range of motion. She exhibits no edema.  Neurological: She is alert and oriented to person, place, and time. No cranial nerve deficit or sensory deficit. She exhibits normal muscle tone. Coordination normal.  5+/5 strength, normal sensation, no drift, normal finger to nose finger  Skin: Skin is warm and dry. Capillary refill takes less than 2 seconds.  Psychiatric: She has a normal mood and affect.  Nursing note and vitals reviewed.    ED Treatments / Results  Labs (all labs ordered are listed, but only abnormal results are displayed) Labs Reviewed  BASIC METABOLIC PANEL - Abnormal; Notable for the following components:  Result Value   Glucose, Bld 109 (*)    All other components within normal limits  URINALYSIS, ROUTINE W REFLEX MICROSCOPIC - Abnormal; Notable for the following components:   Color, Urine STRAW (*)    All other components within normal limits  CBC  CBG MONITORING, ED    EKG EKG Interpretation  Date/Time:  Tuesday April 10 2018 10:35:20 EDT Ventricular Rate:  69 PR Interval:  132 QRS Duration: 82 QT Interval:  398 QTC Calculation: 426 R Axis:   76 Text Interpretation:  Normal sinus rhythm Possible Left atrial enlargement Left ventricular hypertrophy Nonspecific T wave abnormality Abnormal ECG Confirmed by Sherwood Gambler [phone]) on 04/10/2018 11:44:47 AM   Radiology Ct Head Wo Contrast  Result Date: 04/10/2018 CLINICAL DATA:  Lightheadedness, status post breast cancer surgery EXAM: CT HEAD WITHOUT CONTRAST TECHNIQUE: Contiguous axial images were obtained from the base of the skull through the vertex without intravenous contrast. COMPARISON:  05/27/2014 FINDINGS: Brain: No evidence of acute infarction, hemorrhage, hydrocephalus, extra-axial collection or mass lesion/mass effect. Vascular: No hyperdense vessel or unexpected calcification. Skull: Normal. Negative for fracture or focal lesion. Sinuses/Orbits: The  visualized paranasal sinuses are essentially clear. The mastoid air cells are unopacified. Other: None. IMPRESSION: Normal head CT. Electronically Signed   By: Julian Hy M.D.   On: 04/10/2018 13:48   Dg Chest Portable 1 View  Result Date: 04/10/2018 CLINICAL DATA:  Chest pain, weakness, fatigue EXAM: PORTABLE CHEST 1 VIEW COMPARISON:  01/29/2017 FINDINGS: Cardiomegaly. Mild hyperinflation/COPD. No confluent airspace opacities or effusions. No acute bony abnormality. IMPRESSION: Mild hyperinflation/COPD.  Mild cardiomegaly.  No active disease. Electronically Signed   By: Rolm Baptise M.D.   On: 04/10/2018 13:21    Procedures Procedures (including critical care time)  Medications Ordered in ED Medications  lactated ringers bolus 1,000 mL (1,000 mLs Intravenous New Bag/Given 04/10/18 1315)  ondansetron (ZOFRAN) injection 4 mg (4 mg Intravenous Given 04/10/18 1315)     Initial Impression / Assessment and Plan / ED Course  I have reviewed the triage vital signs and the nursing notes.  Pertinent labs & imaging results that were available during my care of the patient were reviewed by me and considered in my medical decision making (see chart for details).     MARSENA TAFF is an 82 year old female with history of breast cancer status post surgery who presents to the ED with fatigue.  Patient with normal vitals.  No fever.  Patient with recent surgery to remove breast cancer several days ago.  Ever since the surgery patient has had some general fatigue.  Denies any cough, sputum production, fever, chills, urinary symptoms heard no abdominal pain.  Patient just feels mildly nauseous.  EKG shows sinus rhythm with no signs of ischemic changes.  Patient is overall well-appearing.  Neurologically intact.  Patient has had some difficulty with her appetite over the last several days.  Lab work showed no significant anemia, electrolyte abnormality, leukocytosis, kidney injury.  Patient with  unremarkable head CT, no signs of pneumonia, pneumothorax, pleural effusion on chest x-ray.  Urinalysis showed no signs of infection.  Patient given normal saline bolus and Zofran with improvement of symptoms.  No etiology of fatigue at this time.  Likely patient still recovering from surgery and recommend close follow-up with primary care provider.  Told to increase oral hydration.  Patient discharged in the ED in good condition and told to return to ED if symptoms worsen.  Patient discharged with her son at the  bedside.  This chart was dictated using voice recognition software.  Despite best efforts to proofread,  errors can occur which can change the documentation meaning.   Final Clinical Impressions(s) / ED Diagnoses   Final diagnoses:  Other fatigue    ED Discharge Orders    None       Lennice Sites, DO 04/10/18 1508

## 2018-04-10 NOTE — ED Notes (Signed)
Signature pad unavailable at time of pt discharge. Pt verbalized understanding of d/c instructions and denied any further questions.

## 2018-04-10 NOTE — ED Triage Notes (Signed)
Patient to ED c/o lightheadedness since Friday, following surgical procedure to L breast (cancer). She states she normally has it from the anesthesia, but it hasn't gone away since and she "just feels awful." Denies pain, N/V, or fevers/chills. Resp e/u, skin warm/dry.

## 2018-04-17 NOTE — Progress Notes (Signed)
Location of Breast Cancer: upper-inner quadrant of left breast  Histology per Pathology Report: 04/06/18:  Breast, lumpectomy, Left - INVASIVE DUCTAL CARCINOMA WITH EXTRAVASATED MUCIN, GRADE II/III, SPANNING 2.8 CM. - DUCTAL CARCINOMA IN SITU, INTERMEDIATE GRADE. - INVASIVE CARCINOMA IS BROADLY 0.15 CM TO THE INFERIOR MARGIN.  Receptor Status: ER(90%), PR (5%), Her2-neu (negative), Ki-(5%)  Did patient present with symptoms (if so, please note symptoms) or was this found on screening mammography?: routine screening mammogram  Past/Anticipated interventions by surgeon, if any: 04/06/18: Procedure: Procedure(s): LEFT BREAST LUMPECTOMY   Surgeon: Excell Seltzer T   Past/Anticipated interventions by medical oncology, if any: Chemotherapy Per Dr. Lindi Adie 03/21/18:  Recommendation: 1.  Breast conserving surgery without sentinel lymph node study  2 adjuvant radiation therapy.  3.  Followed by adjuvant antiestrogen therapy with letrozole 2.5 mg daily for 5 years.  Lymphedema issues, if any:  No  Pain issues, if any:  No  SAFETY ISSUES:  Prior radiation? No  Pacemaker/ICD? No  Possible current pregnancy? No, post-menopausal  Is the patient on methotrexate? No  Current Complaints / other details:  Pt presents today for follow up new visit with Dr. Sondra Come for Radiation Oncology following breast surgery. Pt is distressed about grandchild's health and scored a 5 on distress screen. SW consult ordered per protocol. Pt is unaccompanied.     Loma Sousa, RN 04/19/2018,3:24 PM

## 2018-04-19 ENCOUNTER — Ambulatory Visit
Admission: RE | Admit: 2018-04-19 | Discharge: 2018-04-19 | Disposition: A | Discharge status: Home / Self Care | Payer: Medicare PPO | Source: Ambulatory Visit | Attending: Radiation Oncology | Admitting: Radiation Oncology

## 2018-04-19 ENCOUNTER — Other Ambulatory Visit: Payer: Self-pay

## 2018-04-19 ENCOUNTER — Encounter: Payer: Self-pay | Admitting: Radiation Oncology

## 2018-04-19 VITALS — BP 134/67 | HR 78 | Temp 98.7°F | Resp 20 | Ht 64.0 in | Wt 147.2 lb

## 2018-04-19 DIAGNOSIS — C50212 Malignant neoplasm of upper-inner quadrant of left female breast: Secondary | ICD-10-CM | POA: Insufficient documentation

## 2018-04-19 DIAGNOSIS — Z9889 Other specified postprocedural states: Secondary | ICD-10-CM | POA: Diagnosis not present

## 2018-04-19 DIAGNOSIS — Z79899 Other long term (current) drug therapy: Secondary | ICD-10-CM | POA: Insufficient documentation

## 2018-04-19 DIAGNOSIS — Z170 Estrogen receptor positive status [ER+]: Secondary | ICD-10-CM | POA: Diagnosis not present

## 2018-04-19 NOTE — Progress Notes (Signed)
Radiation Oncology         [phone] ________________________________  Name: Dawn Rodriguez MRN: [id number]  Date: 04/19/2018  DOB: 06/12/1936  Follow-Up Visit Note -reevaluation note  CC: Glendale Chard, MD  Excell Seltzer, MD    ICD-10-CM   1. Malignant neoplasm of upper-inner quadrant of left breast in female, estrogen receptor positive [facility]) C50.212 Ambulatory referral to Social Work   Z17.0     Diagnosis:  Stage IB Left Breast, UIQ, Invasive Ductal Carcinoma with Intermediate Grade DCIS, Grade 2, ER+, PR+ (pT2, Nx)  Narrative:  The patient returns today for post-lumpectomy follow-up. She reports feeling well overall, and that her surgery went well. She was seen in breast clinic on 03/21/18. Since consultation, she underwent left breast lumpectomy on 04/06/18. Pathology report revealed:  -invasive ductal carcinoma with extravasated mucin, grade 2/3, spanning 2.8 cm -ductal carcinoma in situ, intermediate grade -invasive carcinoma is broadly 0.15 cm to the inferior margin                              ALLERGIES:  is allergic to tobramycin.  Meds: Current Outpatient Medications  Medication Sig Dispense Refill  . amLODipine (NORVASC) 2.5 MG tablet Take 2.5 mg by mouth daily.    . calcium carbonate (TUMS - DOSED IN MG ELEMENTAL CALCIUM) 500 MG chewable tablet Chew 1 tablet by mouth daily as needed for indigestion or heartburn.    . cholecalciferol (VITAMIN D) 1000 UNITS tablet Take 1,000 Units by mouth daily.    . Garlic 3016 MG CAPS Take 1,000 mg by mouth daily.     . Glucosamine-Chondroitin (ARTHX SS PO) Take 2 tablets by mouth daily.    . Glycerin-Polysorbate 80 (REFRESH DRY EYE THERAPY OP) Place 1-2 drops into both eyes 4 (four) times daily as needed (for dry eyes).     Marland Kitchen ipratropium (ATROVENT) 0.06 % nasal spray Place 2 sprays into both nostrils 4 (four) times daily. 15 mL 1  . KRILL OIL OMEGA-3 PO Take 1 capsule by mouth daily.    . Magnesium 250 MG TABS Take 250 mg by  mouth daily.     . Multiple Vitamins-Minerals (MULTIVITAMIN WITH MINERALS) tablet Take 1 tablet by mouth daily.    Marland Kitchen omeprazole (PRILOSEC) 20 MG capsule Take 20 mg by mouth daily.    . potassium gluconate 595 MG TABS Take 595 mg by mouth daily.    . vitamin B-12 (CYANOCOBALAMIN) 1000 MCG tablet Take 1,000 mcg by mouth daily.    . traMADol (ULTRAM) 50 MG tablet Take 1 tablet (50 mg total) by mouth every 6 (six) hours as needed. (Patient not taking: Reported on 04/10/2018) 10 tablet 1   No current facility-administered medications for this encounter.     Review of Systems: A 10+ POINT REVIEW OF SYSTEMS WAS OBTAINED including neurology, dermatology, psychiatry, cardiac, respiratory, lymph, extremities, GI, GU, musculoskeletal, constitutional, reproductive, HEENT. All pertinent positives are noted in the HPI. All others are negative.  Physical Findings: The patient is in no acute distress. Patient is alert and oriented.  height is 5\' 4"  (1.626 m) and weight is 147 lb 3.2 oz (66.8 kg). Her oral temperature is 98.7 F (37.1 C). Her blood pressure is 134/67 and her pulse is 78. Her respiration is 20 and oxygen saturation is 100%.   No significant changes. Lungs are clear to auscultation bilaterally. Heart has regular rate and rhythm. No palpable cervical, supraclavicular, or axillary adenopathy.  Abdomen soft, non-tender, normal bowel sounds. Right breast: no palpable masses, nipple discharge or bleeding. Left breast: patient has a well-healing scar in the upper-inner quadrant, approximately the 9:30 position, without signs of drainage or infection.  Lab Findings: Lab Results  Component Value Date   WBC 4.1 04/10/2018   HGB 12.6 04/10/2018   HCT 38.6 04/10/2018   MCV 88.1 04/10/2018   PLT 254 04/10/2018    Radiographic Findings: Ct Head Wo Contrast  Result Date: 04/10/2018 CLINICAL DATA:  Lightheadedness, status post breast cancer surgery EXAM: CT HEAD WITHOUT CONTRAST TECHNIQUE: Contiguous  axial images were obtained from the base of the skull through the vertex without intravenous contrast. COMPARISON:  05/27/2014 FINDINGS: Brain: No evidence of acute infarction, hemorrhage, hydrocephalus, extra-axial collection or mass lesion/mass effect. Vascular: No hyperdense vessel or unexpected calcification. Skull: Normal. Negative for fracture or focal lesion. Sinuses/Orbits: The visualized paranasal sinuses are essentially clear. The mastoid air cells are unopacified. Other: None. IMPRESSION: Normal head CT. Electronically Signed   By: Julian Hy M.D.   On: 04/10/2018 13:48   Dg Chest Portable 1 View  Result Date: 04/10/2018 CLINICAL DATA:  Chest pain, weakness, fatigue EXAM: PORTABLE CHEST 1 VIEW COMPARISON:  01/29/2017 FINDINGS: Cardiomegaly. Mild hyperinflation/COPD. No confluent airspace opacities or effusions. No acute bony abnormality. IMPRESSION: Mild hyperinflation/COPD.  Mild cardiomegaly.  No active disease. Electronically Signed   By: Rolm Baptise M.D.   On: 04/10/2018 13:21    Impression:  Stage IB Left Breast Invasive Ductal Carcinoma with DCIS, Grade 2 (pT2, Nx) The patient would be a good candidate for adjuvant radiation therapy. Today, I talked to the patient  about the findings and work-up thus far.  We discussed the natural history of breast cancer and general treatment, highlighting the role of radiotherapy in the management.  We discussed the available radiation techniques, and focused on the details of logistics and delivery.  We reviewed the anticipated acute and late sequelae associated with radiation in this setting.  The patient was encouraged to ask questions that I answered to the best of my ability.  A patient consent form was discussed and signed.  We retained a copy for our records.  The patient would like to proceed with radiation and will be scheduled for CT simulation.  Plan: She is scheduled to return for CT simulation on 05/07/18 at 3 pm. Anticipate between  4-6 weeks of radiation therapy. She is scheduled to follow up with Dr. Lindi Adie on 04/27/18.   -----------------------------------  Blair Promise, PhD, MD  This document serves as a record of services personally performed by Gery Pray, MD. It was created on his behalf by Wilburn Mylar, a trained medical scribe. The creation of this record is based on the scribe's personal observations and the provider's statements to them. This document has been checked and approved by the attending provider.

## 2018-04-24 DIAGNOSIS — N390 Urinary tract infection, site not specified: Secondary | ICD-10-CM | POA: Diagnosis not present

## 2018-04-24 DIAGNOSIS — R350 Frequency of micturition: Secondary | ICD-10-CM | POA: Diagnosis not present

## 2018-04-25 ENCOUNTER — Encounter: Payer: Self-pay | Admitting: General Practice

## 2018-04-25 NOTE — Progress Notes (Signed)
[facility] Psychosocial Distress Screening Clinical Social Work  Clinical Social Work was referred by distress screening protocol.  The patient scored a 5 on the Psychosocial Distress Thermometer which indicates moderate distress. Clinical Social Worker contacted patient by phone to assess for distress and other psychosocial needs. Called patient, major concern is her granddaughter who had seizures last year. Worries about side effects of medications that granddaughter is taking.  Cares for granddaughter during the day so "I can see all these changes."  Advised to discuss concerns w granddaughter's parents. Children are adults, not worried about them.  "Its not the cancer, not the surgery, it's my granddaughter that stresses me out."  Has support from family, no concerns reported re transportation or care needs at home.    ONCBCN DISTRESS SCREENING 04/19/2018  Screening Type Initial Screening  Distress experienced in past week (1-10) 5  Family Problem type Other (comment);Children  Emotional problem type   Referral to support programs      Clinical Social Worker follow up needed: No.  If yes, follow up plan:  Beverely Pace, [place], LCSW Clinical Social Worker Phone:  [phone]

## 2018-04-27 ENCOUNTER — Inpatient Hospital Stay: Payer: Medicare PPO | Attending: Hematology and Oncology | Admitting: Hematology and Oncology

## 2018-04-27 NOTE — Assessment & Plan Note (Deleted)
04/06/2018:Left lumpectomy: IDC with extravasated mucin, grade 2, 2.8 cm, intermediate grade DCIS, margins negative, ER 90%, PR 5%, HER-2 negative ratio 1.19, Ki-67 5%, T2N0 stage IA AJCC 8  Pathology counseling: I discussed the final pathology report of the patient provided  a copy of this report. I discussed the margins as well as lymph node surgeries. We also discussed the final staging along with previously performed ER/PR and HER-2/neu testing.  Treatment plan: 1.  Adjuvant radiation therapy 2. adjuvant antiestrogen therapy with letrozole 2.5 mg daily x5 years  Return to clinic after radiation to start antiestrogen therapy.

## 2018-05-07 ENCOUNTER — Ambulatory Visit
Admission: RE | Admit: 2018-05-07 | Discharge: 2018-05-07 | Disposition: A | Discharge status: Home / Self Care | Payer: Medicare PPO | Source: Ambulatory Visit | Attending: Radiation Oncology | Admitting: Radiation Oncology

## 2018-05-07 DIAGNOSIS — Z170 Estrogen receptor positive status [ER+]: Secondary | ICD-10-CM | POA: Diagnosis not present

## 2018-05-07 DIAGNOSIS — C50212 Malignant neoplasm of upper-inner quadrant of left female breast: Secondary | ICD-10-CM | POA: Insufficient documentation

## 2018-05-07 DIAGNOSIS — Z510 Encounter for antineoplastic radiation therapy: Secondary | ICD-10-CM | POA: Diagnosis not present

## 2018-05-07 NOTE — Progress Notes (Signed)
  Radiation Oncology         [phone] ________________________________  Name: Dawn Rodriguez MRN: [id number]  Date: 05/07/2018  DOB: 1936-05-16  SIMULATION AND TREATMENT PLANNING NOTE    ICD-10-CM   1. Malignant neoplasm of upper-inner quadrant of left breast in female, estrogen receptor positive ([place]) C50.212    Z17.0     DIAGNOSIS:   Stage IB Left Breast Invasive Ductal Carcinoma with DCIS, Grade 2 (pT2, Nx)  NARRATIVE:  The patient was brought to the [facility].  Identity was confirmed.  All relevant records and images related to the planned course of therapy were reviewed.  The patient freely provided informed written consent to proceed with treatment after reviewing the details related to the planned course of therapy. The consent form was witnessed and verified by the simulation staff.  Then, the patient was set-up in a stable reproducible  supine position for radiation therapy.  CT images were obtained.  Surface markings were placed.  The CT images were loaded into the planning software.  Then the target and avoidance structures were contoured.  Treatment planning then occurred.  The radiation prescription was entered and confirmed.  Then, I designed and supervised the construction of a total of 3 medically necessary complex treatment devices.  I have requested : 3D Simulation  I have requested a DVH of the following structures: heart, lungs, lumpectomy cavity.  I have ordered:dose calc.  PLAN:  The patient will receive 40.05 Gy in 15 fractions followed by a boost to the lumpectomy cavity of 10 gray in 5 fractions.  -----------------------------------   Optical Surface Tracking Plan:  Since intensity modulated radiotherapy (IMRT) and 3D conformal radiation treatment methods are predicated on accurate and precise positioning for treatment, intrafraction motion monitoring is medically necessary to ensure accurate and safe treatment delivery.  The ability to quantify  intrafraction motion without excessive ionizing radiation dose can only be performed with optical surface tracking. Accordingly, surface imaging offers the opportunity to obtain 3D measurements of patient position throughout IMRT and 3D treatments without excessive radiation exposure.  I am ordering optical surface tracking for this patient's upcoming course of radiotherapy. ________________________________   Special treatment procedure : was performed today due to the extra time and effort required by myself to plan and prepare this patient for deep inspiration breath hold technique.  I have determined cardiac sparing to be of benefit to this patient to prevent long term cardiac damage due to radiation of the heart.  Bellows were placed on the patient's abdomen. To facilitate cardiac sparing, the patient was coached by the radiation therapists on breath hold techniques and breathing practice was performed. Practice waveforms were obtained. The patient was then scanned while maintaining breath hold in the treatment position.  This image was then transferred over to the imaging specialist. The imaging specialist then created a fusion of the free breathing and breath hold scans using the chest wall as the stable structure. I personally reviewed the fusion in axial, coronal and sagittal image planes.  Excellent cardiac sparing was obtained.  I felt the patient is an appropriate candidate for breath hold and the patient will be treated as such.  The image fusion was then reviewed with the patient to reinforce the necessity of reproducible breath hold.      Blair Promise, PhD, MD

## 2018-05-09 ENCOUNTER — Encounter: Payer: Self-pay | Admitting: Internal Medicine

## 2018-05-09 ENCOUNTER — Encounter: Payer: Self-pay | Admitting: Nurse Practitioner

## 2018-05-09 DIAGNOSIS — N3001 Acute cystitis with hematuria: Secondary | ICD-10-CM

## 2018-05-09 DIAGNOSIS — N390 Urinary tract infection, site not specified: Secondary | ICD-10-CM | POA: Insufficient documentation

## 2018-05-10 ENCOUNTER — Telehealth: Payer: Self-pay | Admitting: Hematology and Oncology

## 2018-05-10 NOTE — Telephone Encounter (Signed)
Per 9/18 sch msg.  Called patient.  Made appt with Dr. Lindi Adie for 10/22 at 3:30 pm.

## 2018-05-14 DIAGNOSIS — Z510 Encounter for antineoplastic radiation therapy: Secondary | ICD-10-CM | POA: Diagnosis not present

## 2018-05-14 DIAGNOSIS — C50212 Malignant neoplasm of upper-inner quadrant of left female breast: Secondary | ICD-10-CM | POA: Diagnosis not present

## 2018-05-14 DIAGNOSIS — Z170 Estrogen receptor positive status [ER+]: Secondary | ICD-10-CM | POA: Diagnosis not present

## 2018-05-15 ENCOUNTER — Ambulatory Visit
Admission: RE | Admit: 2018-05-15 | Discharge: 2018-05-15 | Disposition: A | Discharge status: Home / Self Care | Payer: Medicare PPO | Source: Ambulatory Visit | Attending: Radiation Oncology | Admitting: Radiation Oncology

## 2018-05-15 DIAGNOSIS — Z170 Estrogen receptor positive status [ER+]: Principal | ICD-10-CM

## 2018-05-15 DIAGNOSIS — Z510 Encounter for antineoplastic radiation therapy: Secondary | ICD-10-CM | POA: Diagnosis not present

## 2018-05-15 DIAGNOSIS — C50212 Malignant neoplasm of upper-inner quadrant of left female breast: Secondary | ICD-10-CM

## 2018-05-15 NOTE — Progress Notes (Signed)
  Radiation Oncology         [phone] ________________________________  Name: Dawn Rodriguez MRN: [id number]  Date: 05/15/2018  DOB: 1936/03/26  Simulation Verification Note    ICD-10-CM   1. Malignant neoplasm of upper-inner quadrant of left breast in female, estrogen receptor positive ([place]) C50.212    Z17.0     Status: outpatient  NARRATIVE: The patient was brought to the treatment unit and placed in the planned treatment position. The clinical setup was verified. Then port films were obtained and uploaded to the radiation oncology medical record software.  The treatment beams were carefully compared against the planned radiation fields. The position location and shape of the radiation fields was reviewed. They targeted volume of tissue appears to be appropriately covered by the radiation beams. Organs at risk appear to be excluded as planned.  Based on my personal review, I approved the simulation verification. The patient's treatment will proceed as planned.  -----------------------------------  Blair Promise, PhD, MD

## 2018-05-16 ENCOUNTER — Ambulatory Visit
Admission: RE | Admit: 2018-05-16 | Discharge: 2018-05-16 | Disposition: A | Discharge status: Home / Self Care | Payer: Medicare PPO | Source: Ambulatory Visit | Attending: Radiation Oncology | Admitting: Radiation Oncology

## 2018-05-16 DIAGNOSIS — C50212 Malignant neoplasm of upper-inner quadrant of left female breast: Secondary | ICD-10-CM | POA: Diagnosis not present

## 2018-05-16 DIAGNOSIS — Z510 Encounter for antineoplastic radiation therapy: Secondary | ICD-10-CM | POA: Diagnosis not present

## 2018-05-16 DIAGNOSIS — Z170 Estrogen receptor positive status [ER+]: Secondary | ICD-10-CM | POA: Diagnosis not present

## 2018-05-17 ENCOUNTER — Ambulatory Visit
Admission: RE | Admit: 2018-05-17 | Discharge: 2018-05-17 | Disposition: A | Discharge status: Home / Self Care | Payer: Medicare PPO | Source: Ambulatory Visit | Attending: Radiation Oncology | Admitting: Radiation Oncology

## 2018-05-17 DIAGNOSIS — C50212 Malignant neoplasm of upper-inner quadrant of left female breast: Secondary | ICD-10-CM | POA: Diagnosis not present

## 2018-05-17 DIAGNOSIS — Z170 Estrogen receptor positive status [ER+]: Secondary | ICD-10-CM | POA: Diagnosis not present

## 2018-05-17 DIAGNOSIS — Z510 Encounter for antineoplastic radiation therapy: Secondary | ICD-10-CM | POA: Diagnosis not present

## 2018-05-18 ENCOUNTER — Ambulatory Visit
Admission: RE | Admit: 2018-05-18 | Discharge: 2018-05-18 | Disposition: A | Discharge status: Home / Self Care | Payer: Medicare PPO | Source: Ambulatory Visit | Attending: Radiation Oncology | Admitting: Radiation Oncology

## 2018-05-18 DIAGNOSIS — Z170 Estrogen receptor positive status [ER+]: Secondary | ICD-10-CM | POA: Diagnosis not present

## 2018-05-18 DIAGNOSIS — C50212 Malignant neoplasm of upper-inner quadrant of left female breast: Secondary | ICD-10-CM | POA: Diagnosis not present

## 2018-05-18 DIAGNOSIS — Z510 Encounter for antineoplastic radiation therapy: Secondary | ICD-10-CM | POA: Diagnosis not present

## 2018-05-21 ENCOUNTER — Ambulatory Visit
Admission: RE | Admit: 2018-05-21 | Discharge: 2018-05-21 | Disposition: A | Discharge status: Home / Self Care | Payer: Medicare PPO | Source: Ambulatory Visit | Attending: Radiation Oncology | Admitting: Radiation Oncology

## 2018-05-21 DIAGNOSIS — Z510 Encounter for antineoplastic radiation therapy: Secondary | ICD-10-CM | POA: Diagnosis not present

## 2018-05-21 DIAGNOSIS — Z170 Estrogen receptor positive status [ER+]: Secondary | ICD-10-CM | POA: Diagnosis not present

## 2018-05-21 DIAGNOSIS — C50212 Malignant neoplasm of upper-inner quadrant of left female breast: Secondary | ICD-10-CM | POA: Diagnosis not present

## 2018-05-22 ENCOUNTER — Ambulatory Visit
Admission: RE | Admit: 2018-05-22 | Discharge: 2018-05-22 | Disposition: A | Discharge status: Home / Self Care | Payer: Medicare PPO | Source: Ambulatory Visit | Attending: Radiation Oncology | Admitting: Radiation Oncology

## 2018-05-22 DIAGNOSIS — Z170 Estrogen receptor positive status [ER+]: Secondary | ICD-10-CM | POA: Insufficient documentation

## 2018-05-22 DIAGNOSIS — Z510 Encounter for antineoplastic radiation therapy: Secondary | ICD-10-CM | POA: Diagnosis not present

## 2018-05-22 DIAGNOSIS — C50212 Malignant neoplasm of upper-inner quadrant of left female breast: Secondary | ICD-10-CM | POA: Diagnosis not present

## 2018-05-23 ENCOUNTER — Ambulatory Visit
Admission: RE | Admit: 2018-05-23 | Discharge: 2018-05-23 | Disposition: A | Discharge status: Home / Self Care | Payer: Medicare PPO | Source: Ambulatory Visit | Attending: Radiation Oncology | Admitting: Radiation Oncology

## 2018-05-23 ENCOUNTER — Ambulatory Visit: Payer: Medicare PPO

## 2018-05-23 ENCOUNTER — Encounter: Payer: Self-pay | Admitting: Nurse Practitioner

## 2018-05-23 VITALS — BP 142/78 | HR 84 | Temp 99.0°F | Ht 63.5 in | Wt 149.6 lb

## 2018-05-23 DIAGNOSIS — Z0000 Encounter for general adult medical examination without abnormal findings: Secondary | ICD-10-CM

## 2018-05-23 DIAGNOSIS — Z510 Encounter for antineoplastic radiation therapy: Secondary | ICD-10-CM | POA: Diagnosis not present

## 2018-05-23 DIAGNOSIS — Z170 Estrogen receptor positive status [ER+]: Secondary | ICD-10-CM | POA: Diagnosis not present

## 2018-05-23 DIAGNOSIS — C50212 Malignant neoplasm of upper-inner quadrant of left female breast: Secondary | ICD-10-CM | POA: Diagnosis not present

## 2018-05-23 NOTE — Progress Notes (Signed)
Subjective:   Dawn Rodriguez is a 82 y.o. female who presents for Medicare Annual (Subsequent) preventive examination.  Review of Systems:  N/A       Objective:     Vitals: BP (!) 142/78   Pulse 84   Temp 99 F (37.2 C)   Ht 5' 3.5" (1.613 m)   Wt 149 lb 9.6 oz (67.9 kg)   SpO2 96%   BMI 26.08 kg/m   Body mass index is 26.08 kg/m.  Advanced Directives 04/19/2018 04/10/2018 04/06/2018 04/02/2018 05/26/2014  Does Patient Have a Medical Advance Directive? No No No No No  Would patient like information on creating a medical advance directive? - No - Patient declined No - Patient declined No - Patient declined No - patient declined information    Tobacco Social History   Tobacco Use  Smoking Status Never Smoker  Smokeless Tobacco Never Used     Counseling given: Not Answered   Clinical Intake:     Pain Score: 0-No pain                 Past Medical History:  Diagnosis Date  . Arthritis   . Bilateral dry eyes   . Cancer ([place]) 02/2018   left breast cancer  . Cataracts, bilateral   . Chronic headache   . GERD (gastroesophageal reflux disease)   . Hypertension   . TMJ syndrome    Past Surgical History:  Procedure Laterality Date  . BREAST BIOPSY Right 2012   stereotatic biopsy  . BREAST LUMPECTOMY Left 04/06/2018   Procedure: LEFT BREAST LUMPECTOMY;  Surgeon: Excell Seltzer, MD;  Location: [facility];  Service: General;  Laterality: Left;  . CATARACT EXTRACTION     Family History  Problem Relation Age of Onset  . Pneumonia Mother   . Breast cancer Cousin    Social History   Socioeconomic History  . Marital status: Divorced    Spouse name: Not on file  . Number of children: Not on file  . Years of education: Not on file  . Highest education level: Not on file  Occupational History  . Not on file  Social Needs  . Financial resource strain: Not on file  . Food insecurity:    Worry: Not on file    Inability: Not on file    . Transportation needs:    Medical: Not on file    Non-medical: Not on file  Tobacco Use  . Smoking status: Never Smoker  . Smokeless tobacco: Never Used  Substance and Sexual Activity  . Alcohol use: No  . Drug use: No  . Sexual activity: Not Currently  Lifestyle  . Physical activity:    Days per week: Not on file    Minutes per session: Not on file  . Stress: Not on file  Relationships  . Social connections:    Talks on phone: Not on file    Gets together: Not on file    Attends religious service: Not on file    Active member of club or organization: Not on file    Attends meetings of clubs or organizations: Not on file    Relationship status: Not on file  Other Topics Concern  . Not on file  Social History Narrative  . Not on file    Outpatient Encounter Medications as of 05/23/2018  Medication Sig  . ALPRAZolam (XANAX) 0.25 MG tablet Take 0.25 mg by mouth at bedtime as needed for anxiety.  Marland Kitchen  amLODipine (NORVASC) 2.5 MG tablet Take 2.5 mg by mouth daily.  . calcium carbonate (TUMS - DOSED IN MG ELEMENTAL CALCIUM) 500 MG chewable tablet Chew 1 tablet by mouth daily as needed for indigestion or heartburn.  . cholecalciferol (VITAMIN D) 1000 UNITS tablet Take 1,000 Units by mouth daily.  . Garlic 1856 MG CAPS Take 1,000 mg by mouth daily.   . Glucosamine-Chondroitin (ARTHX SS PO) Take 2 tablets by mouth daily.  . Glycerin-Polysorbate 80 (REFRESH DRY EYE THERAPY OP) Place 1-2 drops into both eyes 4 (four) times daily as needed (for dry eyes).   Marland Kitchen ipratropium (ATROVENT) 0.06 % nasal spray Place 2 sprays into both nostrils 4 (four) times daily.  Marland Kitchen KRILL OIL OMEGA-3 PO Take 1 capsule by mouth daily.  . Magnesium 250 MG TABS Take 250 mg by mouth daily.   . mometasone (ELOCON) 0.1 % cream Apply 1 application topically daily.  . Multiple Vitamins-Minerals (MULTIVITAMIN WITH MINERALS) tablet Take 1 tablet by mouth daily.  Marland Kitchen omeprazole (PRILOSEC) 20 MG capsule Take 20 mg by mouth  daily.  . potassium gluconate 595 MG TABS Take 595 mg by mouth daily.  . traMADol (ULTRAM) 50 MG tablet Take 1 tablet (50 mg total) by mouth every 6 (six) hours as needed. (Patient not taking: Reported on 04/10/2018)  . vitamin B-12 (CYANOCOBALAMIN) 1000 MCG tablet Take 1,000 mcg by mouth daily.   No facility-administered encounter medications on file as of 05/23/2018.     Activities of Daily Living In your present state of health, do you have any difficulty performing the following activities: 04/06/2018  Hearing? N  Vision? N  Difficulty concentrating or making decisions? N  Walking or climbing stairs? N  Dressing or bathing? N  Some recent data might be hidden    Patient Care Team: Glendale Chard, MD as PCP - General (Internal Medicine) Excell Seltzer, MD as Consulting Physician (General Surgery) Nicholas Lose, MD as Consulting Physician (Hematology and Oncology) Gery Pray, MD as Consulting Physician (Radiation Oncology)    Assessment:   This is a routine wellness examination for Dawn Rodriguez.  Exercise Activities and Dietary recommendations    Goals   None     Fall Risk Fall Risk  04/19/2018  Falls in the past year? No   Is the patient's home free of loose throw rugs in walkways, pet beds, electrical cords, etc?   yes      Grab bars in the bathroom? no      Handrails on the stairs?   no      Adequate lighting?   yes  Timed Get Up and Go performed: N/A  Depression Screen PHQ 2/9 Scores 04/19/2018  PHQ - 2 Score 0     Cognitive Function         There is no immunization history on file for this patient.  Qualifies for Shingles Vaccine? Yes, but declines  Screening Tests Health Maintenance  Topic Date Due  . TETANUS/TDAP  09/02/1956  . PNA vac Low Risk Adult (1 of 2 - PCV13) 09/02/2002  . INFLUENZA VACCINE  03/22/2018  . DEXA SCAN  Completed    Cancer Screenings: Lung: Low Dose CT Chest recommended if Age 12-80 years, 30 pack-year currently smoking  OR have quit w/in 15years. Patient does not qualify. Breast:  Up to date on Mammogram? Yes   Up to date of Bone Density/Dexa? Yes Colorectal: up to date due 04/2023  Additional Screenings: : Hepatitis C Screening: N/A     Plan:  Patient would like to decrease her sweet sugar intake. States she will start cutting back on snacking.   I have personally reviewed and noted the following in the patient's chart:   . Medical and social history . Use of alcohol, tobacco or illicit drugs  . Current medications and supplements . Functional ability and status . Nutritional status . Physical activity . Advanced directives . List of other physicians . Hospitalizations, surgeries, and ER visits in previous 12 months . Vitals . Screenings to include cognitive, depression, and falls . Referrals and appointments  In addition, I have reviewed and discussed with patient certain preventive protocols, quality metrics, and best practice recommendations. A written personalized care plan for preventive services as well as general preventive health recommendations were provided to patient.     Kellie Simmering, LPN  [phone]

## 2018-05-23 NOTE — Patient Instructions (Signed)
Dawn Rodriguez , Thank you for taking time to come for your Medicare Wellness Visit. I appreciate your ongoing commitment to your health goals. Please review the following plan we discussed and let me know if I can assist you in the future.   Screening recommendations/referrals: Colonoscopy: no longer required Mammogram: up to date Bone Density: up to date  Recommended yearly ophthalmology/optometry visit for glaucoma screening and checkup Recommended yearly dental visit for hygiene and checkup  Vaccinations: Influenza vaccine: declined Pneumococcal vaccine: up to date Tdap vaccine: up to date Shingles vaccine: declined     Advanced directives: paperwork given during this visit. Explained to get filled out and bring [place] a copy once completed and notarized  Conditions/risks identified: Obesity. Wants to work on cutting back on sweets  Next appointment: 05/30/2019 at 3:00p   Preventive Care 65 Years and Older, Female Preventive care refers to lifestyle choices and visits with your health care provider that can promote health and wellness. What does preventive care include?  A yearly physical exam. This is also called an annual well check.  Dental exams once or twice a year.  Routine eye exams. Ask your health care provider how often you should have your eyes checked.  Personal lifestyle choices, including:  Daily care of your teeth and gums.  Regular physical activity.  Eating a healthy diet.  Avoiding tobacco and drug use.  Limiting alcohol use.  Practicing safe sex.  Taking low-dose aspirin every day.  Taking vitamin and mineral supplements as recommended by your health care provider. What happens during an annual well check? The services and screenings done by your health care provider during your annual well check will depend on your age, overall health, lifestyle risk factors, and family history of disease. Counseling  Your health care provider may ask you questions  about your:  Alcohol use.  Tobacco use.  Drug use.  Emotional well-being.  Home and relationship well-being.  Sexual activity.  Eating habits.  History of falls.  Memory and ability to understand (cognition).  Work and work Statistician.  Reproductive health. Screening  You may have the following tests or measurements:  Height, weight, and BMI.  Blood pressure.  Lipid and cholesterol levels. These may be checked every 5 years, or more frequently if you are over 3 years old.  Skin check.  Lung cancer screening. You may have this screening every year starting at age 6 if you have a 30-pack-year history of smoking and currently smoke or have quit within the past 15 years.  Fecal occult blood test (FOBT) of the stool. You may have this test every year starting at age 59.  Flexible sigmoidoscopy or colonoscopy. You may have a sigmoidoscopy every 5 years or a colonoscopy every 10 years starting at age 53.  Hepatitis C blood test.  Hepatitis B blood test.  Sexually transmitted disease (STD) testing.  Diabetes screening. This is done by checking your blood sugar (glucose) after you have not eaten for a while (fasting). You may have this done every 1-3 years.  Bone density scan. This is done to screen for osteoporosis. You may have this done starting at age 81.  Mammogram. This may be done every 1-2 years. Talk to your health care provider about how often you should have regular mammograms. Talk with your health care provider about your test results, treatment options, and if necessary, the need for more tests. Vaccines  Your health care provider may recommend certain vaccines, such as:  Influenza vaccine.  This is recommended every year.  Tetanus, diphtheria, and acellular pertussis (Tdap, Td) vaccine. You may need a Td booster every 10 years.  Zoster vaccine. You may need this after age 50.  Pneumococcal 13-valent conjugate (PCV13) vaccine. One dose is  recommended after age 79.  Pneumococcal polysaccharide (PPSV23) vaccine. One dose is recommended after age 75. Talk to your health care provider about which screenings and vaccines you need and how often you need them. This information is not intended to replace advice given to you by your health care provider. Make sure you discuss any questions you have with your health care provider. Document Released: 09/04/2015 Document Revised: 04/27/2016 Document Reviewed: 06/09/2015 Elsevier Interactive Patient Education  2017 [employer] Prevention in the Home Falls can cause injuries. They can happen to people of all ages. There are many things you can do to make your home safe and to help prevent falls. What can I do on the outside of my home?  Regularly fix the edges of walkways and driveways and fix any cracks.  Remove anything that might make you trip as you walk through a door, such as a raised step or threshold.  Trim any bushes or trees on the path to your home.  Use bright outdoor lighting.  Clear any walking paths of anything that might make someone trip, such as rocks or tools.  Regularly check to see if handrails are loose or broken. Make sure that both sides of any steps have handrails.  Any raised decks and porches should have guardrails on the edges.  Have any leaves, snow, or ice cleared regularly.  Use sand or salt on walking paths during winter.  Clean up any spills in your garage right away. This includes oil or grease spills. What can I do in the bathroom?  Use night lights.  Install grab bars by the toilet and in the tub and shower. Do not use towel bars as grab bars.  Use non-skid mats or decals in the tub or shower.  If you need to sit down in the shower, use a plastic, non-slip stool.  Keep the floor dry. Clean up any water that spills on the floor as soon as it happens.  Remove soap buildup in the tub or shower regularly.  Attach bath mats  securely with double-sided non-slip rug tape.  Do not have throw rugs and other things on the floor that can make you trip. What can I do in the bedroom?  Use night lights.  Make sure that you have a light by your bed that is easy to reach.  Do not use any sheets or blankets that are too big for your bed. They should not hang down onto the floor.  Have a firm chair that has side arms. You can use this for support while you get dressed.  Do not have throw rugs and other things on the floor that can make you trip. What can I do in the kitchen?  Clean up any spills right away.  Avoid walking on wet floors.  Keep items that you use a lot in easy-to-reach places.  If you need to reach something above you, use a strong step stool that has a grab bar.  Keep electrical cords out of the way.  Do not use floor polish or wax that makes floors slippery. If you must use wax, use non-skid floor wax.  Do not have throw rugs and other things on the floor that can make  you trip. What can I do with my stairs?  Do not leave any items on the stairs.  Make sure that there are handrails on both sides of the stairs and use them. Fix handrails that are broken or loose. Make sure that handrails are as long as the stairways.  Check any carpeting to make sure that it is firmly attached to the stairs. Fix any carpet that is loose or worn.  Avoid having throw rugs at the top or bottom of the stairs. If you do have throw rugs, attach them to the floor with carpet tape.  Make sure that you have a light switch at the top of the stairs and the bottom of the stairs. If you do not have them, ask someone to add them for you. What else can I do to help prevent falls?  Wear shoes that:  Do not have high heels.  Have rubber bottoms.  Are comfortable and fit you well.  Are closed at the toe. Do not wear sandals.  If you use a stepladder:  Make sure that it is fully opened. Do not climb a closed  stepladder.  Make sure that both sides of the stepladder are locked into place.  Ask someone to hold it for you, if possible.  Clearly mark and make sure that you can see:  Any grab bars or handrails.  First and last steps.  Where the edge of each step is.  Use tools that help you move around (mobility aids) if they are needed. These include:  Canes.  Walkers.  Scooters.  Crutches.  Turn on the lights when you go into a dark area. Replace any light bulbs as soon as they burn out.  Set up your furniture so you have a clear path. Avoid moving your furniture around.  If any of your floors are uneven, fix them.  If there are any pets around you, be aware of where they are.  Review your medicines with your doctor. Some medicines can make you feel dizzy. This can increase your chance of falling. Ask your doctor what other things that you can do to help prevent falls. This information is not intended to replace advice given to you by your health care provider. Make sure you discuss any questions you have with your health care provider. Document Released: 06/04/2009 Document Revised: 01/14/2016 Document Reviewed: 09/12/2014 Elsevier Interactive Patient Education  2017 [employer].

## 2018-05-24 ENCOUNTER — Other Ambulatory Visit: Payer: Self-pay | Admitting: Nurse Practitioner

## 2018-05-24 ENCOUNTER — Ambulatory Visit
Admission: RE | Admit: 2018-05-24 | Discharge: 2018-05-24 | Disposition: A | Discharge status: Home / Self Care | Payer: Medicare PPO | Source: Ambulatory Visit | Attending: Radiation Oncology | Admitting: Radiation Oncology

## 2018-05-24 DIAGNOSIS — C50212 Malignant neoplasm of upper-inner quadrant of left female breast: Secondary | ICD-10-CM | POA: Diagnosis not present

## 2018-05-24 DIAGNOSIS — Z170 Estrogen receptor positive status [ER+]: Secondary | ICD-10-CM | POA: Diagnosis not present

## 2018-05-24 DIAGNOSIS — F419 Anxiety disorder, unspecified: Secondary | ICD-10-CM

## 2018-05-24 DIAGNOSIS — Z510 Encounter for antineoplastic radiation therapy: Secondary | ICD-10-CM | POA: Diagnosis not present

## 2018-05-24 MED ORDER — ALPRAZOLAM 0.25 MG PO TABS: 0.2500 mg | ORAL_TABLET | Freq: Every evening | ORAL | 1 refills | Status: DC | PRN

## 2018-05-25 ENCOUNTER — Ambulatory Visit
Admission: RE | Admit: 2018-05-25 | Discharge: 2018-05-25 | Disposition: A | Discharge status: Home / Self Care | Payer: Medicare PPO | Source: Ambulatory Visit | Attending: Radiation Oncology | Admitting: Radiation Oncology

## 2018-05-25 DIAGNOSIS — Z170 Estrogen receptor positive status [ER+]: Secondary | ICD-10-CM | POA: Diagnosis not present

## 2018-05-25 DIAGNOSIS — Z510 Encounter for antineoplastic radiation therapy: Secondary | ICD-10-CM | POA: Diagnosis not present

## 2018-05-25 DIAGNOSIS — C50212 Malignant neoplasm of upper-inner quadrant of left female breast: Secondary | ICD-10-CM | POA: Diagnosis not present

## 2018-05-28 ENCOUNTER — Ambulatory Visit
Admission: RE | Admit: 2018-05-28 | Discharge: 2018-05-28 | Disposition: A | Discharge status: Home / Self Care | Payer: Medicare PPO | Source: Ambulatory Visit | Attending: Radiation Oncology | Admitting: Radiation Oncology

## 2018-05-28 DIAGNOSIS — Z510 Encounter for antineoplastic radiation therapy: Secondary | ICD-10-CM | POA: Diagnosis not present

## 2018-05-28 DIAGNOSIS — Z170 Estrogen receptor positive status [ER+]: Secondary | ICD-10-CM | POA: Diagnosis not present

## 2018-05-28 DIAGNOSIS — C50212 Malignant neoplasm of upper-inner quadrant of left female breast: Secondary | ICD-10-CM | POA: Diagnosis not present

## 2018-05-29 ENCOUNTER — Ambulatory Visit
Admission: RE | Admit: 2018-05-29 | Discharge: 2018-05-29 | Disposition: A | Discharge status: Home / Self Care | Payer: Medicare PPO | Source: Ambulatory Visit | Attending: Radiation Oncology | Admitting: Radiation Oncology

## 2018-05-29 ENCOUNTER — Ambulatory Visit: Payer: Medicare PPO | Admitting: Radiation Oncology

## 2018-05-29 DIAGNOSIS — Z170 Estrogen receptor positive status [ER+]: Secondary | ICD-10-CM | POA: Diagnosis not present

## 2018-05-29 DIAGNOSIS — C50212 Malignant neoplasm of upper-inner quadrant of left female breast: Secondary | ICD-10-CM | POA: Diagnosis not present

## 2018-05-29 DIAGNOSIS — Z510 Encounter for antineoplastic radiation therapy: Secondary | ICD-10-CM | POA: Diagnosis not present

## 2018-05-30 ENCOUNTER — Ambulatory Visit
Admission: RE | Admit: 2018-05-30 | Discharge: 2018-05-30 | Disposition: A | Discharge status: Home / Self Care | Payer: Medicare PPO | Source: Ambulatory Visit | Attending: Radiation Oncology | Admitting: Radiation Oncology

## 2018-05-30 DIAGNOSIS — Z510 Encounter for antineoplastic radiation therapy: Secondary | ICD-10-CM | POA: Diagnosis not present

## 2018-05-30 DIAGNOSIS — Z170 Estrogen receptor positive status [ER+]: Secondary | ICD-10-CM | POA: Diagnosis not present

## 2018-05-30 DIAGNOSIS — C50212 Malignant neoplasm of upper-inner quadrant of left female breast: Secondary | ICD-10-CM | POA: Diagnosis not present

## 2018-05-31 ENCOUNTER — Ambulatory Visit
Admission: RE | Admit: 2018-05-31 | Discharge: 2018-05-31 | Disposition: A | Discharge status: Home / Self Care | Payer: Medicare PPO | Source: Ambulatory Visit | Attending: Radiation Oncology | Admitting: Radiation Oncology

## 2018-05-31 DIAGNOSIS — C50212 Malignant neoplasm of upper-inner quadrant of left female breast: Secondary | ICD-10-CM | POA: Diagnosis not present

## 2018-05-31 DIAGNOSIS — Z170 Estrogen receptor positive status [ER+]: Secondary | ICD-10-CM | POA: Diagnosis not present

## 2018-05-31 DIAGNOSIS — Z510 Encounter for antineoplastic radiation therapy: Secondary | ICD-10-CM | POA: Diagnosis not present

## 2018-06-01 ENCOUNTER — Ambulatory Visit
Admission: RE | Admit: 2018-06-01 | Discharge: 2018-06-01 | Disposition: A | Discharge status: Home / Self Care | Payer: Medicare PPO | Source: Ambulatory Visit | Attending: Radiation Oncology | Admitting: Radiation Oncology

## 2018-06-01 DIAGNOSIS — Z170 Estrogen receptor positive status [ER+]: Secondary | ICD-10-CM | POA: Diagnosis not present

## 2018-06-01 DIAGNOSIS — Z510 Encounter for antineoplastic radiation therapy: Secondary | ICD-10-CM | POA: Diagnosis not present

## 2018-06-01 DIAGNOSIS — C50212 Malignant neoplasm of upper-inner quadrant of left female breast: Secondary | ICD-10-CM | POA: Diagnosis not present

## 2018-06-04 ENCOUNTER — Ambulatory Visit
Admission: RE | Admit: 2018-06-04 | Discharge: 2018-06-04 | Disposition: A | Discharge status: Home / Self Care | Payer: Medicare PPO | Source: Ambulatory Visit | Attending: Radiation Oncology | Admitting: Radiation Oncology

## 2018-06-04 DIAGNOSIS — Z170 Estrogen receptor positive status [ER+]: Principal | ICD-10-CM

## 2018-06-04 DIAGNOSIS — C50212 Malignant neoplasm of upper-inner quadrant of left female breast: Secondary | ICD-10-CM

## 2018-06-04 DIAGNOSIS — Z510 Encounter for antineoplastic radiation therapy: Secondary | ICD-10-CM | POA: Diagnosis not present

## 2018-06-04 MED ORDER — RADIAPLEXRX EX GEL
Freq: Once | CUTANEOUS | Status: AC
  Administered 2018-06-04: 18:00:00 via TOPICAL

## 2018-06-05 ENCOUNTER — Ambulatory Visit
Admission: RE | Admit: 2018-06-05 | Discharge: 2018-06-05 | Disposition: A | Discharge status: Home / Self Care | Payer: Medicare PPO | Source: Ambulatory Visit | Attending: Radiation Oncology | Admitting: Radiation Oncology

## 2018-06-05 DIAGNOSIS — C50212 Malignant neoplasm of upper-inner quadrant of left female breast: Secondary | ICD-10-CM | POA: Diagnosis not present

## 2018-06-05 DIAGNOSIS — Z170 Estrogen receptor positive status [ER+]: Secondary | ICD-10-CM | POA: Diagnosis not present

## 2018-06-05 DIAGNOSIS — Z510 Encounter for antineoplastic radiation therapy: Secondary | ICD-10-CM | POA: Diagnosis not present

## 2018-06-06 ENCOUNTER — Ambulatory Visit
Admission: RE | Admit: 2018-06-06 | Discharge: 2018-06-06 | Disposition: A | Discharge status: Home / Self Care | Payer: Medicare PPO | Source: Ambulatory Visit | Attending: Radiation Oncology | Admitting: Radiation Oncology

## 2018-06-06 DIAGNOSIS — Z510 Encounter for antineoplastic radiation therapy: Secondary | ICD-10-CM | POA: Diagnosis not present

## 2018-06-06 DIAGNOSIS — Z170 Estrogen receptor positive status [ER+]: Secondary | ICD-10-CM | POA: Diagnosis not present

## 2018-06-06 DIAGNOSIS — C50212 Malignant neoplasm of upper-inner quadrant of left female breast: Secondary | ICD-10-CM | POA: Diagnosis not present

## 2018-06-07 ENCOUNTER — Ambulatory Visit
Admission: RE | Admit: 2018-06-07 | Discharge: 2018-06-07 | Disposition: A | Discharge status: Home / Self Care | Payer: Medicare PPO | Source: Ambulatory Visit | Attending: Radiation Oncology | Admitting: Radiation Oncology

## 2018-06-07 ENCOUNTER — Other Ambulatory Visit: Payer: Self-pay

## 2018-06-07 DIAGNOSIS — Z510 Encounter for antineoplastic radiation therapy: Secondary | ICD-10-CM | POA: Diagnosis not present

## 2018-06-07 DIAGNOSIS — C50212 Malignant neoplasm of upper-inner quadrant of left female breast: Secondary | ICD-10-CM | POA: Diagnosis not present

## 2018-06-07 DIAGNOSIS — Z170 Estrogen receptor positive status [ER+]: Secondary | ICD-10-CM | POA: Diagnosis not present

## 2018-06-07 MED ORDER — OMEPRAZOLE 20 MG PO CPDR: 20.0000 mg | DELAYED_RELEASE_CAPSULE | Freq: Every day | ORAL | 0 refills | Status: DC

## 2018-06-08 ENCOUNTER — Ambulatory Visit
Admission: RE | Admit: 2018-06-08 | Discharge: 2018-06-08 | Disposition: A | Discharge status: Home / Self Care | Payer: Medicare PPO | Source: Ambulatory Visit | Attending: Radiation Oncology | Admitting: Radiation Oncology

## 2018-06-08 DIAGNOSIS — Z510 Encounter for antineoplastic radiation therapy: Secondary | ICD-10-CM | POA: Diagnosis not present

## 2018-06-08 DIAGNOSIS — C50212 Malignant neoplasm of upper-inner quadrant of left female breast: Secondary | ICD-10-CM | POA: Diagnosis not present

## 2018-06-08 DIAGNOSIS — Z170 Estrogen receptor positive status [ER+]: Secondary | ICD-10-CM | POA: Diagnosis not present

## 2018-06-11 ENCOUNTER — Ambulatory Visit
Admission: RE | Admit: 2018-06-11 | Discharge: 2018-06-11 | Disposition: A | Discharge status: Home / Self Care | Payer: Medicare PPO | Source: Ambulatory Visit | Attending: Radiation Oncology | Admitting: Radiation Oncology

## 2018-06-11 ENCOUNTER — Encounter: Payer: Self-pay | Admitting: Radiation Oncology

## 2018-06-11 DIAGNOSIS — Z170 Estrogen receptor positive status [ER+]: Secondary | ICD-10-CM | POA: Diagnosis not present

## 2018-06-11 DIAGNOSIS — C50212 Malignant neoplasm of upper-inner quadrant of left female breast: Secondary | ICD-10-CM | POA: Diagnosis not present

## 2018-06-11 DIAGNOSIS — Z510 Encounter for antineoplastic radiation therapy: Secondary | ICD-10-CM | POA: Diagnosis not present

## 2018-06-12 ENCOUNTER — Inpatient Hospital Stay: Payer: Medicare PPO | Admitting: Hematology and Oncology

## 2018-06-12 NOTE — Assessment & Plan Note (Deleted)
04/06/2018:Left lumpectomy: IDC with extravasated mucin, grade 2, 2.8 cm, intermediate grade DCIS, margins negative, ER 90%, PR 5%, HER-2 negative ratio 1.19, Ki-67 5%, T2N0 stage IA AJCC 8  Recommendation: Adjuvant antiestrogen therapy with letrozole 2.5 mg daily x5 years  Letrozole counseling:We discussed the risks and benefits of anti-estrogen therapy with aromatase inhibitors. These include but not limited to insomnia, hot flashes, mood changes, vaginal dryness, bone density loss, and weight gain. We strongly believe that the benefits far outweigh the risks. Patient understands these risks and consented to starting treatment. Planned treatment duration is 5 years.  Return to clinic in 3 months for survivorship care plan visit   

## 2018-06-13 ENCOUNTER — Telehealth: Payer: Self-pay | Admitting: Hematology and Oncology

## 2018-06-13 NOTE — Telephone Encounter (Signed)
Scheduled appt per 10/23 sch message - per patient request to schedule appt on same day as Dr. Sondra Come appt.

## 2018-06-14 NOTE — Progress Notes (Signed)
  Radiation Oncology         [phone] ________________________________  Name: Dawn Rodriguez MRN: [id number]  Date: 06/11/2018  DOB: 02/28/1936  End of Treatment Note  Diagnosis:   82 y.o. female with Stage IB Left Breast Invasive Ductal Carcinoma with DCIS, Grade 2 (pT2, Nx)     Indication for treatment:  Curative       Radiation treatment dates:   05/15/2018 - 06/11/2018  Site/dose:   The left breast received 40.05 Gy in 15 fractions, followed by a boost to the lumpectomy cavity of 10 Gy in 5 fractions, to yield a final total dose of 50.05 Gy.  Beams/energy:   3D / 6X photon  Narrative: The patient tolerated radiation treatment relatively well.  She experienced increased fatigue and some expected skin changes with mild erythema and hyperpigmentation. She is applying Radiaplex to her skin twice daily.  Plan: The patient has completed radiation treatment. The patient will return to radiation oncology clinic for routine followup in one month. I advised them to call or return sooner if they have any questions or concerns related to their recovery or treatment.  -----------------------------------  Blair Promise, PhD, MD  This document serves as a record of services personally performed by Gery Pray, MD. It was created on his behalf by Rae Lips, a trained medical scribe. The creation of this record is based on the scribe's personal observations and the provider's statements to them. This document has been checked and approved by the attending provider.

## 2018-07-16 ENCOUNTER — Encounter: Payer: Self-pay | Admitting: Radiation Oncology

## 2018-07-16 ENCOUNTER — Inpatient Hospital Stay: Payer: Medicare PPO | Attending: Hematology and Oncology | Admitting: Hematology and Oncology

## 2018-07-16 ENCOUNTER — Telehealth: Payer: Self-pay | Admitting: Adult Health

## 2018-07-16 ENCOUNTER — Other Ambulatory Visit: Payer: Self-pay

## 2018-07-16 ENCOUNTER — Ambulatory Visit
Admission: RE | Admit: 2018-07-16 | Discharge: 2018-07-16 | Disposition: A | Discharge status: Home / Self Care | Payer: Medicare PPO | Source: Ambulatory Visit | Attending: Radiation Oncology | Admitting: Radiation Oncology

## 2018-07-16 VITALS — BP 123/58 | HR 58 | Temp 98.1°F | Resp 20 | Ht 63.5 in | Wt 153.6 lb

## 2018-07-16 DIAGNOSIS — C50212 Malignant neoplasm of upper-inner quadrant of left female breast: Secondary | ICD-10-CM | POA: Insufficient documentation

## 2018-07-16 DIAGNOSIS — Z170 Estrogen receptor positive status [ER+]: Secondary | ICD-10-CM | POA: Insufficient documentation

## 2018-07-16 DIAGNOSIS — Z923 Personal history of irradiation: Secondary | ICD-10-CM | POA: Diagnosis not present

## 2018-07-16 DIAGNOSIS — M810 Age-related osteoporosis without current pathological fracture: Secondary | ICD-10-CM | POA: Diagnosis not present

## 2018-07-16 DIAGNOSIS — Z79899 Other long term (current) drug therapy: Secondary | ICD-10-CM | POA: Insufficient documentation

## 2018-07-16 MED ORDER — ALENDRONATE SODIUM 70 MG PO TABS: 70.0000 mg | ORAL_TABLET | ORAL | 3 refills | Status: DC

## 2018-07-16 MED ORDER — LETROZOLE 2.5 MG PO TABS: 2.5000 mg | ORAL_TABLET | Freq: Every day | ORAL | 3 refills | Status: DC

## 2018-07-16 NOTE — Progress Notes (Signed)
Pt presents today for one month f/u with Dr. Sondra Come. Pt reports fatigue is improving and rates fatigue as mild. Pt denies any c/o pain with exception of rare "needles inserting like a pinprick" sensation. Pt reports skin is hyperpigmented and is peeling. Pt endorses using cream daily on breast.   BP (!) 123/58 (BP Location: Left Arm, Patient Position: Sitting)   Pulse (!) 58   Temp 98.1 F (36.7 C) (Oral)   Resp 20   Ht 5' 3.5" (1.613 m)   Wt 153 lb 9.6 oz (69.7 kg)   SpO2 100%   BMI 26.78 kg/m   Wt Readings from Last 3 Encounters:  07/16/18 153 lb 9.6 oz (69.7 kg)  07/16/18 153 lb 14.4 oz (69.8 kg)  05/23/18 149 lb 9.6 oz (67.9 kg)   Loma Sousa, RN BSN

## 2018-07-16 NOTE — Assessment & Plan Note (Signed)
04/06/2018:Left lumpectomy: IDC with extravasated mucin, grade 2, 2.8 cm, intermediate grade DCIS, margins negative, ER 90%, PR 5%, HER-2 negative ratio 1.19, Ki-67 5%, T2N0 stage IA AJCC 8 Adjuvant radiation therapy 05/16/2018 to 06/11/2018  Treatment plan: Letrozole 2.5 mg daily start 07/16/2018  Letrozole counseling:We discussed the risks and benefits of anti-estrogen therapy with aromatase inhibitors. These include but not limited to insomnia, hot flashes, mood changes, vaginal dryness, bone density loss, and weight gain. We strongly believe that the benefits far outweigh the risks. Patient understands these risks and consented to starting treatment. Planned treatment duration is 5-7 years.  Return to clinic in 3 months for survivorship care plan visit

## 2018-07-16 NOTE — Telephone Encounter (Signed)
Gave avs and calendar ° °

## 2018-07-16 NOTE — Progress Notes (Signed)
Radiation Oncology         ([phone]) [phone] ________________________________  Name: Dawn Rodriguez MRN: [id number]  Date: 07/16/2018  DOB: 01/19/1936  Follow-Up Visit Note  CC: [place] Chard, Rodriguez  Excell Seltzer, Rodriguez    ICD-10-CM   1. Malignant neoplasm of upper-inner quadrant of left breast in female, estrogen receptor positive (Dawn Rodriguez) C50.212    Z17.0     Diagnosis:   Stage IB, pT2 Nx Mx, Left Breast, UIQ, Invasive Ductal Carcinoma with DCIS, ER (+), PR (+), HER2 (neg), grade 2   Interval Since Last Radiation:  1 months  05/15/18-06/11/18;   40.05 Gy in 15 fractions followed by a boost to the lumpectomy cavity of 10 Gy in 5 fractions to yield a final total dose of 50.05 Gy  Narrative:  The patient returns today for routine follow-up.  she is doing well overall. She is unaccompanied. She saw Dr. Lindi Adie today who recommended she begin anti-estrogen medication Letrozole 2.5 mg.            On review of systems, she reports occasional sharp pain in her breast. She also reports clear urine. she denies swelling and any other symptoms. Pertinent positives are listed and detailed within the above HPI.                 ALLERGIES:  is allergic to tobramycin.  Meds: Current Outpatient Medications  Medication Sig Dispense Refill  . alendronate (FOSAMAX) 70 MG tablet Take 1 tablet (70 mg total) by mouth once a week. Take with a full glass of water on an empty stomach. 12 tablet 3  . ALPRAZolam (XANAX) 0.25 MG tablet Take 1 tablet (0.25 mg total) by mouth at bedtime as needed for anxiety. 30 tablet 1  . amLODipine (NORVASC) 2.5 MG tablet Take 2.5 mg by mouth daily.    . calcium carbonate (TUMS - DOSED IN MG ELEMENTAL CALCIUM) 500 MG chewable tablet Chew 1 tablet by mouth daily as needed for indigestion or heartburn.    . cholecalciferol (VITAMIN D) 1000 UNITS tablet Take 1,000 Units by mouth daily.    . Garlic 9735 MG CAPS Take 1,000 mg by mouth daily.     . Glucosamine-Chondroitin (ARTHX SS  PO) Take 2 tablets by mouth daily.    . Glycerin-Polysorbate 80 (REFRESH DRY EYE THERAPY OP) Place 1-2 drops into both eyes 4 (four) times daily as needed (for dry eyes).     Marland Kitchen ipratropium (ATROVENT) 0.06 % nasal spray Place 2 sprays into both nostrils 4 (four) times daily. 15 mL 1  . KRILL OIL OMEGA-3 PO Take 1 capsule by mouth daily.    Marland Kitchen letrozole (FEMARA) 2.5 MG tablet Take 1 tablet (2.5 mg total) by mouth daily. 90 tablet 3  . Magnesium 250 MG TABS Take 250 mg by mouth daily.     . mometasone (ELOCON) 0.1 % cream Apply 1 application topically daily.    . Multiple Vitamins-Minerals (MULTIVITAMIN WITH MINERALS) tablet Take 1 tablet by mouth daily.    Marland Kitchen omeprazole (PRILOSEC) 20 MG capsule Take 1 capsule (20 mg total) by mouth daily. 90 capsule 0  . potassium gluconate 595 MG TABS Take 595 mg by mouth daily.    . vitamin B-12 (CYANOCOBALAMIN) 1000 MCG tablet Take 1,000 mcg by mouth daily.    . traMADol (ULTRAM) 50 MG tablet Take 1 tablet (50 mg total) by mouth every 6 (six) hours as needed. (Patient not taking: Reported on 04/10/2018) 10 tablet 1   No current  facility-administered medications for this encounter.     Physical Findings: The patient is in no acute distress. Patient is alert and oriented.  height is 5' 3.5" (1.613 m) and weight is 153 lb 9.6 oz (69.7 kg). Her oral temperature is 98.1 F (36.7 C). Her blood pressure is 123/58 (abnormal) and her pulse is 58 (abnormal). Her respiration is 20 and oxygen saturation is 100%. .  No significant changes. Lungs are clear to auscultation bilaterally. Heart has regular rate and rhythm. No palpable cervical, supraclavicular, or axillary adenopathy. Abdomen soft, non-tender, normal bowel sounds. Left breast with  Hyperpigmentation throughout much of the breast with peeling. Skin is healed without any breakdown noted. Pt has some mild swelling in the breast but no palpable mass, nipple discharge or bleeding.   Lab Findings: Lab Results    Component Value Date   WBC 4.1 04/10/2018   HGB 12.6 04/10/2018   HCT 38.6 04/10/2018   MCV 88.1 04/10/2018   PLT 254 04/10/2018    Radiographic Findings: No results found.  Impression:  Stage IB, pT2 Nx Mx, Left Breast, UIQ, Invasive Ductal Carcinoma with DCIS, ER (+), PR (+), HER2 (neg), grade 2   The patient is recovering from the effects of radiation with no evidence of recurrence.  Plan:  PRN follow-up. She will continue close f/u with medical oncology and will start [place] 2.5 mg.  ____________________________________   Dawn Promise, PhD, Rodriguez    This document serves as a record of services personally performed by Dawn Rodriguez. It was created on his behalf by Mary-Margaret Loma Messing, a trained medical scribe. The creation of this record is based on the scribe's personal observations and the provider's statements to them. This document has been checked and approved by the attending provider.

## 2018-07-16 NOTE — Progress Notes (Signed)
Patient Care Team: Glendale Chard, MD as PCP - General (Internal Medicine) Excell Seltzer, MD as Consulting Physician (General Surgery) Nicholas Lose, MD as Consulting Physician (Hematology and Oncology) Gery Pray, MD as Consulting Physician (Radiation Oncology)  DIAGNOSIS:  Encounter Diagnosis  Name Primary?  . Malignant neoplasm of upper-inner quadrant of left breast in female, estrogen receptor positive ([place])     SUMMARY OF ONCOLOGIC HISTORY:   Malignant neoplasm of upper-inner quadrant of left breast in female, estrogen receptor positive ([place])   03/14/2018 Initial Diagnosis    Screening detected left breast mass 2.3 cm at 9:30 position biopsy revealed invasive ductal carcinoma grade 2 ER 90%, PR 5%, Ki-67 5%, HER-2 negative ratio 1.19, axilla negative, T2N0 stage Ib clinical stage    03/21/2018 Cancer Staging    Staging form: Breast, AJCC 8th Edition - Clinical stage from 03/21/2018: Stage IB (cT2, cN0, cM0, G2, ER+, PR+, HER2-) - Signed by Nicholas Lose, MD on 03/21/2018    04/06/2018 Surgery    Left lumpectomy: IDC with extravasated mucin, grade 2, 2.8 cm, intermediate grade DCIS, margins negative, ER 90%, PR 5%, HER-2 negative ratio 1.19, Ki-67 5%, T2N0 stage IA AJCC 8    04/25/2018 Cancer Staging    Staging form: Breast, AJCC 8th Edition - Pathologic: Stage IA (pT2, pN0, cM0, G2, ER+, PR+, HER2-) - Signed by Gardenia Phlegm, NP on 04/25/2018    05/16/2018 - 06/11/2018 Radiation Therapy    Adjuvant radiation therapy     CHIEF COMPLIANT: Follow-up after radiation therapy to discuss antiestrogen therapy  INTERVAL HISTORY: Dawn Rodriguez is a 82 year old with above-mentioned history of left breast cancer underwent lumpectomy and radiation is currently here to discuss starting antiestrogen therapy.  She has recovered very well from the effects of radiation.  REVIEW OF SYSTEMS:   Constitutional: Denies fevers, chills or abnormal weight loss Eyes: Denies  blurriness of vision Ears, nose, mouth, throat, and face: Denies mucositis or sore throat Respiratory: Denies cough, dyspnea or wheezes Cardiovascular: Denies palpitation, chest discomfort Gastrointestinal:  Denies nausea, heartburn or change in bowel habits Skin: Denies abnormal skin rashes Lymphatics: Denies new lymphadenopathy or easy bruising Neurological:Denies numbness, tingling or new weaknesses Behavioral/Psych: Mood is stable, no new changes  Extremities: No lower extremity edema Breast:  denies any pain or lumps or nodules in either breasts All other systems were reviewed with the patient and are negative.  I have reviewed the past medical history, past surgical history, social history and family history with the patient and they are unchanged from previous note.  ALLERGIES:  is allergic to tobramycin.  MEDICATIONS:  Current Outpatient Medications  Medication Sig Dispense Refill  . ALPRAZolam (XANAX) 0.25 MG tablet Take 1 tablet (0.25 mg total) by mouth at bedtime as needed for anxiety. 30 tablet 1  . amLODipine (NORVASC) 2.5 MG tablet Take 2.5 mg by mouth daily.    . calcium carbonate (TUMS - DOSED IN MG ELEMENTAL CALCIUM) 500 MG chewable tablet Chew 1 tablet by mouth daily as needed for indigestion or heartburn.    . cholecalciferol (VITAMIN D) 1000 UNITS tablet Take 1,000 Units by mouth daily.    . Garlic 5573 MG CAPS Take 1,000 mg by mouth daily.     . Glucosamine-Chondroitin (ARTHX SS PO) Take 2 tablets by mouth daily.    . Glycerin-Polysorbate 80 (REFRESH DRY EYE THERAPY OP) Place 1-2 drops into both eyes 4 (four) times daily as needed (for dry eyes).     Marland Kitchen ipratropium (ATROVENT) 0.06 %  nasal spray Place 2 sprays into both nostrils 4 (four) times daily. 15 mL 1  . KRILL OIL OMEGA-3 PO Take 1 capsule by mouth daily.    . Magnesium 250 MG TABS Take 250 mg by mouth daily.     . mometasone (ELOCON) 0.1 % cream Apply 1 application topically daily.    . Multiple  Vitamins-Minerals (MULTIVITAMIN WITH MINERALS) tablet Take 1 tablet by mouth daily.    Marland Kitchen omeprazole (PRILOSEC) 20 MG capsule Take 1 capsule (20 mg total) by mouth daily. 90 capsule 0  . potassium gluconate 595 MG TABS Take 595 mg by mouth daily.    . traMADol (ULTRAM) 50 MG tablet Take 1 tablet (50 mg total) by mouth every 6 (six) hours as needed. (Patient not taking: Reported on 04/10/2018) 10 tablet 1  . vitamin B-12 (CYANOCOBALAMIN) 1000 MCG tablet Take 1,000 mcg by mouth daily.     No current facility-administered medications for this visit.     PHYSICAL EXAMINATION: ECOG PERFORMANCE STATUS: 1 - Symptomatic but completely ambulatory  Vitals:   07/16/18 1508  BP: (!) 128/48  Pulse: 62  Resp: 18  Temp: 98.2 F (36.8 C)  SpO2: 100%   Filed Weights   07/16/18 1508  Weight: 153 lb 14.4 oz (69.8 kg)    GENERAL:alert, no distress and comfortable SKIN: skin color, texture, turgor are normal, no rashes or significant lesions EYES: normal, Conjunctiva are pink and non-injected, sclera clear OROPHARYNX:no exudate, no erythema and lips, buccal mucosa, and tongue normal  NECK: supple, thyroid normal size, non-tender, without nodularity LYMPH:  no palpable lymphadenopathy in the cervical, axillary or inguinal LUNGS: clear to auscultation and percussion with normal breathing effort HEART: regular rate & rhythm and no murmurs and no lower extremity edema ABDOMEN:abdomen soft, non-tender and normal bowel sounds MUSCULOSKELETAL:no cyanosis of digits and no clubbing  NEURO: alert & oriented x 3 with fluent speech, no focal motor/sensory deficits EXTREMITIES: No lower extremity edema   LABORATORY DATA:  I have reviewed the data as listed CMP Latest Ref Rng & Units 04/10/2018 03/21/2018 11/09/2017  Glucose 70 - 99 mg/dL 109(H) 106(H) -  BUN 8 - 23 mg/dL _0 Creatinine 0.44 - 1.00 mg/dL 0.72 0.84 0.8  Sodium 135 - 145 mmol/L 143 145 144  Potassium 3.5 - 5.1 mmol/L 3.9 4.0 4.3  Chloride  98 - 111 mmol/L 105 109 -  CO2 22 - 32 mmol/L 29 30 -  Calcium 8.9 - 10.3 mg/dL 9.6 9.6 -  Total Protein 6.5 - 8.1 g/dL - 6.9 -  Total Bilirubin 0.3 - 1.2 mg/dL - 0.5 -  Alkaline Phos 38 - 126 U/L - 58 -  AST 15 - 41 U/L - 14(L) -  ALT 0 - 44 U/L - 9 -    Lab Results  Component Value Date   WBC 4.1 04/10/2018   HGB 12.6 04/10/2018   HCT 38.6 04/10/2018   MCV 88.1 04/10/2018   PLT 254 04/10/2018   NEUTROABS 1.5 03/21/2018    ASSESSMENT & PLAN:  Malignant neoplasm of upper-inner quadrant of left breast in female, estrogen receptor positive ([facility]) 04/06/2018:Left lumpectomy: IDC with extravasated mucin, grade 2, 2.8 cm, intermediate grade DCIS, margins negative, ER 90%, PR 5%, HER-2 negative ratio 1.19, Ki-67 5%, T2N0 stage IA AJCC 8 Adjuvant radiation therapy 05/16/2018 to 06/11/2018  Treatment plan: Letrozole 2.5 mg daily start 07/16/2018  Letrozole counseling:We discussed the risks and benefits of anti-estrogen therapy with aromatase inhibitors. These include but  not limited to insomnia, hot flashes, mood changes, vaginal dryness, bone density loss, and weight gain. We strongly believe that the benefits far outweigh the risks. Patient understands these risks and consented to starting treatment. Planned treatment duration is 5-7 years.  Osteoporosis: T score -2.9: I prescribed her Fosamax.  She takes calcium and vitamin D.  Return to clinic in 3 months for survivorship care plan visit  No orders of the defined types were placed in this encounter.  The patient has a good understanding of the overall plan. she agrees with it. she will call with any problems that may develop before the next visit here.   Harriette Ohara, MD 07/16/18

## 2018-07-17 ENCOUNTER — Other Ambulatory Visit: Payer: Self-pay | Admitting: Nurse Practitioner

## 2018-09-05 ENCOUNTER — Telehealth: Payer: Self-pay

## 2018-09-05 NOTE — Telephone Encounter (Signed)
Pt called to report that she has been expriencing aches, pains, headaches, itching, fatigue and moderate hot flashes with letrozole. She has been taking it since November 2019. Pt takes letrozole first thing in the morning and her symptoms started, shortly after starting medication. Symptoms have progressively gotten worse overtime.   Advised that pt stops letrozole for 1-2 weeks. Try taking letrozole at bedtime, if all symptoms resolved. Pt to call back in the next few weeks to update our office of how she is doing with AE. Pt verbalized understanding.

## 2018-09-20 DIAGNOSIS — Z6825 Body mass index (BMI) 25.0-25.9, adult: Secondary | ICD-10-CM | POA: Diagnosis not present

## 2018-09-20 DIAGNOSIS — Z853 Personal history of malignant neoplasm of breast: Secondary | ICD-10-CM | POA: Diagnosis not present

## 2018-09-20 DIAGNOSIS — I10 Essential (primary) hypertension: Secondary | ICD-10-CM | POA: Diagnosis not present

## 2018-09-20 DIAGNOSIS — C50919 Malignant neoplasm of unspecified site of unspecified female breast: Secondary | ICD-10-CM | POA: Diagnosis not present

## 2018-09-20 DIAGNOSIS — M810 Age-related osteoporosis without current pathological fracture: Secondary | ICD-10-CM | POA: Diagnosis not present

## 2018-09-20 DIAGNOSIS — E663 Overweight: Secondary | ICD-10-CM | POA: Diagnosis not present

## 2018-09-20 DIAGNOSIS — F419 Anxiety disorder, unspecified: Secondary | ICD-10-CM | POA: Diagnosis not present

## 2018-09-20 DIAGNOSIS — H547 Unspecified visual loss: Secondary | ICD-10-CM | POA: Diagnosis not present

## 2018-09-20 DIAGNOSIS — Z9841 Cataract extraction status, right eye: Secondary | ICD-10-CM | POA: Diagnosis not present

## 2018-10-16 DIAGNOSIS — H16223 Keratoconjunctivitis sicca, not specified as Sjogren's, bilateral: Secondary | ICD-10-CM | POA: Diagnosis not present

## 2018-10-17 ENCOUNTER — Telehealth: Payer: Self-pay

## 2018-10-17 NOTE — Telephone Encounter (Signed)
Spoke with patient reminding of SCP visit with NP on 10/25/18 at 2 pm.  Patient said she will come to appt.

## 2018-10-25 ENCOUNTER — Inpatient Hospital Stay: Payer: Medicare PPO | Attending: Adult Health | Admitting: Adult Health

## 2018-10-25 ENCOUNTER — Telehealth: Payer: Self-pay | Admitting: Adult Health

## 2018-10-25 ENCOUNTER — Encounter: Payer: Self-pay | Admitting: Adult Health

## 2018-10-25 VITALS — BP 127/78 | HR 85 | Temp 97.9°F | Resp 18 | Ht 63.5 in | Wt 157.6 lb

## 2018-10-25 DIAGNOSIS — M810 Age-related osteoporosis without current pathological fracture: Secondary | ICD-10-CM | POA: Diagnosis not present

## 2018-10-25 DIAGNOSIS — C50212 Malignant neoplasm of upper-inner quadrant of left female breast: Secondary | ICD-10-CM | POA: Diagnosis not present

## 2018-10-25 DIAGNOSIS — M791 Myalgia, unspecified site: Secondary | ICD-10-CM

## 2018-10-25 DIAGNOSIS — M2550 Pain in unspecified joint: Secondary | ICD-10-CM | POA: Insufficient documentation

## 2018-10-25 DIAGNOSIS — Z170 Estrogen receptor positive status [ER+]: Secondary | ICD-10-CM | POA: Diagnosis not present

## 2018-10-25 NOTE — Progress Notes (Signed)
CLINIC:  Survivorship   REASON FOR VISIT:  Routine follow-up post-treatment for a recent history of breast cancer.  BRIEF ONCOLOGIC HISTORY:    Malignant neoplasm of upper-inner quadrant of left breast in female, estrogen receptor positive ([place])   03/14/2018 Initial Diagnosis    Screening detected left breast mass 2.3 cm at 9:30 position biopsy revealed invasive ductal carcinoma grade 2 ER 90%, PR 5%, Ki-67 5%, HER-2 negative ratio 1.19, axilla negative, T2N0 stage Ib clinical stage    03/21/2018 Cancer Staging    Staging form: Breast, AJCC 8th Edition - Clinical stage from 03/21/2018: Stage IB (cT2, cN0, cM0, G2, ER+, PR+, HER2-) - Signed by Nicholas Lose, MD on 03/21/2018    04/06/2018 Surgery    Left lumpectomy: IDC with extravasated mucin, grade 2, 2.8 cm, intermediate grade DCIS, margins negative, ER 90%, PR 5%, HER-2 negative ratio 1.19, Ki-67 5%, T2N0 stage IA AJCC 8    04/25/2018 Cancer Staging    Staging form: Breast, AJCC 8th Edition - Pathologic: Stage IA (pT2, pN0, cM0, G2, ER+, PR+, HER2-) - Signed by Gardenia Phlegm, NP on 04/25/2018    05/16/2018 - 06/11/2018 Radiation Therapy    Adjuvant radiation therapy    07/2018 -  Anti-estrogen oral therapy    Letrozole daily      INTERVAL HISTORY:  Dawn Rodriguez presents to the [facility] today for our initial meeting to review her survivorship care plan detailing her treatment course for breast cancer, as well as monitoring long-term side effects of that treatment, education regarding health maintenance, screening, and overall wellness and health promotion.     Overall, Dawn Rodriguez reports feeling moderately well.  She is taking the Letrozole daily and was struggling with this due to hot flashes, cramping and headaches.  She called [place] and was instructed to take it at night and is feeling much better.  She does have some cramping in her legs and attributes this to arthralgias.  She has not yet started Fosamax, but plans  to do so on Sunday now that she is tolerating the Letrozole better.      REVIEW OF SYSTEMS:  Review of Systems  Constitutional: Negative for appetite change, chills, fatigue, fever and unexpected weight change.  HENT:   Negative for hearing loss, lump/mass, mouth sores and trouble swallowing.   Eyes: Negative for eye problems and icterus.  Respiratory: Negative for chest tightness, cough and shortness of breath.   Cardiovascular: Negative for chest pain, leg swelling and palpitations.  Gastrointestinal: Negative for abdominal distention, abdominal pain, constipation, diarrhea, nausea and vomiting.  Endocrine: Negative for hot flashes.  Genitourinary: Negative for difficulty urinating and dyspareunia.   Musculoskeletal: Negative for arthralgias.  Skin: Negative for itching and rash.  Neurological: Negative for dizziness, extremity weakness, headaches and numbness.  Hematological: Negative for adenopathy. Does not bruise/bleed easily.  Psychiatric/Behavioral: Negative for depression. The patient is not nervous/anxious.   Breast: Denies any new nodularity, masses, tenderness, nipple changes, or nipple discharge.      ONCOLOGY TREATMENT TEAM:  1. Surgeon:  Dr. Excell Seltzer at [facility] 2. Medical Oncologist: Dr. Lindi Adie  3. Radiation Oncologist: Dr. Sondra Come    PAST MEDICAL/SURGICAL HISTORY:  Past Medical History:  Diagnosis Date  . Arthritis   . Bilateral dry eyes   . Cancer ([place]) 02/2018   left breast cancer  . Cataracts, bilateral   . Chronic headache   . GERD (gastroesophageal reflux disease)   . Hypertension   . TMJ syndrome  Past Surgical History:  Procedure Laterality Date  . BREAST BIOPSY Right 2012   stereotatic biopsy  . BREAST LUMPECTOMY Left 04/06/2018   Procedure: LEFT BREAST LUMPECTOMY;  Surgeon: Excell Seltzer, MD;  Location: [facility];  Service: General;  Laterality: Left;  . CATARACT EXTRACTION       ALLERGIES:  Allergies   Allergen Reactions  . Tobramycin Itching, Swelling and Rash    Tobramycin eye drops caused swelling, itching, drainage, rash and peeling of the skin     CURRENT MEDICATIONS:  Outpatient Encounter Medications as of 10/25/2018  Medication Sig Note  . alendronate (FOSAMAX) 70 MG tablet Take 1 tablet (70 mg total) by mouth once a week. Take with a full glass of water on an empty stomach.   . ALPRAZolam (XANAX) 0.25 MG tablet Take 1 tablet (0.25 mg total) by mouth at bedtime as needed for anxiety.   Marland Kitchen amLODipine (NORVASC) 2.5 MG tablet TAKE 1 TABLET EVERY DAY   . Calcium Carb-Cholecalciferol (CALCIUM-VITAMIN D3) 600-400 MG-UNIT TABS Take by mouth 2 (two) times daily.   . calcium carbonate (TUMS - DOSED IN MG ELEMENTAL CALCIUM) 500 MG chewable tablet Chew 1 tablet by mouth daily as needed for indigestion or heartburn.   . Garlic 9147 MG CAPS Take 1,000 mg by mouth daily.    . Glucosamine-Chondroitin (ARTHX SS PO) Take 2 tablets by mouth daily.   . Glycerin-Polysorbate 80 (REFRESH DRY EYE THERAPY OP) Place 1-2 drops into both eyes 4 (four) times daily as needed (for dry eyes).    Marland Kitchen ipratropium (ATROVENT) 0.06 % nasal spray Place 2 sprays into both nostrils 4 (four) times daily.   Marland Kitchen KRILL OIL OMEGA-3 PO Take 1 capsule by mouth daily. 05/27/2014: .  . letrozole (FEMARA) 2.5 MG tablet Take 1 tablet (2.5 mg total) by mouth daily.   . Magnesium 250 MG TABS Take 250 mg by mouth daily.    . mometasone (ELOCON) 0.1 % cream Apply 1 application topically daily.   . Multiple Vitamins-Minerals (MULTIVITAMIN WITH MINERALS) tablet Take 1 tablet by mouth daily.   . potassium gluconate 595 MG TABS Take 595 mg by mouth daily.   . traMADol (ULTRAM) 50 MG tablet Take 1 tablet (50 mg total) by mouth every 6 (six) hours as needed.   . vitamin B-12 (CYANOCOBALAMIN) 1000 MCG tablet Take 1,000 mcg by mouth daily.   Marland Kitchen omeprazole (PRILOSEC) 20 MG capsule Take 1 capsule (20 mg total) by mouth daily.   . [DISCONTINUED]  cholecalciferol (VITAMIN D) 1000 UNITS tablet Take 1,000 Units by mouth daily.    No facility-administered encounter medications on file as of 10/25/2018.      ONCOLOGIC FAMILY HISTORY:  Family History  Problem Relation Age of Onset  . Pneumonia Mother   . Breast cancer Cousin      GENETIC COUNSELING/TESTING: Not at this time  SOCIAL HISTORY:  Social History   Socioeconomic History  . Marital status: Divorced    Spouse name: Not on file  . Number of children: Not on file  . Years of education: Not on file  . Highest education level: Not on file  Occupational History  . Occupation: retired  Scientific laboratory technician  . Financial resource strain: Not hard at all  . Food insecurity:    Worry: Never true    Inability: Never true  . Transportation needs:    Medical: No    Non-medical: No  Tobacco Use  . Smoking status: Never Smoker  . Smokeless  tobacco: Never Used  Substance and Sexual Activity  . Alcohol use: No  . Drug use: No  . Sexual activity: Not Currently  Lifestyle  . Physical activity:    Days per week: 7 days    Minutes per session: 30 min  . Stress: Not at all  Relationships  . Social connections:    Talks on phone: Not on file    Gets together: Not on file    Attends religious service: Not on file    Active member of club or organization: Not on file    Attends meetings of clubs or organizations: Not on file    Relationship status: Not on file  . Intimate partner violence:    Fear of current or ex partner: No    Emotionally abused: No    Physically abused: No    Forced sexual activity: No  Other Topics Concern  . Not on file  Social History Narrative  . Not on file     PHYSICAL EXAMINATION:  Vital Signs:   Vitals:   10/25/18 1422  BP: 127/78  Pulse: 85  Resp: 18  Temp: 97.9 F (36.6 C)  SpO2: 100%   Filed Weights   10/25/18 1422  Weight: 157 lb 9.6 oz (71.5 kg)   General: Well-nourished, well-appearing female in no acute distress.  She is  unaccompanie today.   HEENT: Head is normocephalic.  Pupils equal and reactive to light. Conjunctivae clear without exudate.  Sclerae anicteric. Oral mucosa is pink, moist.  Oropharynx is pink without lesions or erythema.  Lymph: No cervical, supraclavicular, or infraclavicular lymphadenopathy noted on palpation.  Cardiovascular: Regular rate and rhythm.Marland Kitchen Respiratory: Clear to auscultation bilaterally. Chest expansion symmetric; breathing non-labored.  GI: Abdomen soft and round; non-tender, non-distended. Bowel sounds normoactive.  GU: Deferred.  Neuro: No focal deficits. Steady gait.  Psych: Mood and affect normal and appropriate for situation.  Extremities: No edema. MSK: No focal spinal tenderness to palpation.  Full range of motion in bilateral upper extremities Skin: Warm and dry.  LABORATORY DATA:  None for this visit.  DIAGNOSTIC IMAGING:  None for this visit.    ASSESSMENT AND PLAN:  Ms.. Rodriguez is a pleasant 83 y.o. female with Stage IB left breast invasive ductal carcinoma, ER+/PR+/HER2-, diagnosed in 02/2018, treated with lumpectomy, adjuvant radiation therapy, and anti-estrogen therapy with Letrozole beginning in 07/2018.  She presents to the Survivorship Clinic for our initial meeting and routine follow-up post-completion of treatment for breast cancer.    1. Stage IA left breast cancer:  Dawn Rodriguez is continuing to recover from definitive treatment for breast cancer. She will follow-up with her medical oncologist, Dr. Lindi Adie in 6 months with history and physical exam per surveillance protocol.  She will continue her anti-estrogen therapy with Letrozole. Thus far, she is tolerating the Letrozole well, with minimal side effects.  Today, a comprehensive survivorship care plan and treatment summary was reviewed with the patient today detailing her breast cancer diagnosis, treatment course, potential late/long-term effects of treatment, appropriate follow-up care with recommendations  for the future, and patient education resources.  A copy of this summary, along with a letter will be sent to the patient's primary care provider via mail/fax/In Basket message after today's visit.    2. Bone health:  Given Dawn Rodriguez's age/history of breast cancer and her current treatment regimen including anti-estrogen therapy with Letrozole, she is at risk for bone demineralization.  Her last DEXA scan was 07/21/2017, which showed osteoporosis with a T  score of -2.9.  She was started on Fosamax, and will take this beginning Sunday, 10/28/2018.  She will be due for repeat bone density in 06/2019.  In the meantime, she was encouraged to increase her consumption of foods rich in calcium, as well as increase her weight-bearing activities.  She was given education on specific activities to promote bone health.  3. Cancer screening:  Due to Dawn Rodriguez's history and her age, she should receive screening for skin cancers, colon cancer, and gynecologic cancers.  The information and recommendations are listed on the patient's comprehensive care plan/treatment summary and were reviewed in detail with the patient.    4. Health maintenance and wellness promotion: Dawn Rodriguez was encouraged to consume 5-7 servings of fruits and vegetables per day. We reviewed the "Nutrition Rainbow" handout, as well as the handout "Take Control of Your Health and Reduce Your Cancer Risk" from the [place].  She was also encouraged to engage in moderate to vigorous exercise for 30 minutes per day most days of the week. We discussed the [employer] fitness program, which is designed for cancer survivors to help them become more physically fit after cancer treatments.  She was instructed to limit her alcohol consumption and continue to abstain from tobacco use/.     5. Support services/counseling: It is not uncommon for this period of the patient's cancer care trajectory to be one of many emotions and stressors.  We  discussed an opportunity for her to participate in the next session of [facility] ("Finding Your New Normal") support group series designed for patients after they have completed treatment.   Dawn Rodriguez was encouraged to take advantage of our many other support services programs, support groups, and/or counseling in coping with her new life as a cancer survivor after completing anti-cancer treatment.  She was offered support today through active listening and expressive supportive counseling.  She was given information regarding our available services and encouraged to contact me with any questions or for help enrolling in any of our support group/programs.    Dispo:   -Return to cancer center in 6 months for f/u with Dr. Lindi Adie   -Mammogram due in 02/2019 -She is welcome to return back to the Survivorship Clinic at any time; no additional follow-up needed at this time.  -Consider referral back to survivorship as a long-term survivor for continued surveillance  A total of (30) minutes of face-to-face time was spent with this patient with greater than 50% of that time in counseling and care-coordination.   Gardenia Phlegm, NP Survivorship Program [place] [phone]   Note: PRIMARY CARE PROVIDER [place]endale Chard, [place] [phone]

## 2018-10-25 NOTE — Telephone Encounter (Signed)
No los °

## 2018-10-29 ENCOUNTER — Ambulatory Visit: Payer: Medicare PPO | Admitting: Nurse Practitioner

## 2018-10-29 ENCOUNTER — Other Ambulatory Visit (HOSPITAL_COMMUNITY)
Admission: RE | Admit: 2018-10-29 | Discharge: 2018-10-29 | Disposition: A | Discharge status: Home / Self Care | Payer: Medicare PPO | Source: Ambulatory Visit | Attending: Nurse Practitioner | Admitting: Nurse Practitioner

## 2018-10-29 ENCOUNTER — Encounter: Payer: Self-pay | Admitting: Nurse Practitioner

## 2018-10-29 VITALS — BP 122/70 | HR 93 | Temp 98.7°F | Wt 154.4 lb

## 2018-10-29 DIAGNOSIS — R82998 Other abnormal findings in urine: Secondary | ICD-10-CM

## 2018-10-29 DIAGNOSIS — Z139 Encounter for screening, unspecified: Secondary | ICD-10-CM | POA: Insufficient documentation

## 2018-10-29 DIAGNOSIS — R109 Unspecified abdominal pain: Secondary | ICD-10-CM | POA: Diagnosis not present

## 2018-10-29 LAB — POCT URINALYSIS DIPSTICK
BILIRUBIN UA: NEGATIVE
Blood, UA: NEGATIVE
Glucose, UA: NEGATIVE
KETONES UA: NEGATIVE
Nitrite, UA: NEGATIVE
PH UA: 8.5 — AB (ref 5.0–8.0)
Protein, UA: NEGATIVE
Spec Grav, UA: 1.015 (ref 1.010–1.025)
UROBILINOGEN UA: NEGATIVE U/dL — AB

## 2018-10-29 MED ORDER — NITROFURANTOIN MONOHYD MACRO 100 MG PO CAPS: 100.0000 mg | ORAL_CAPSULE | Freq: Two times a day (BID) | ORAL | 0 refills | Status: AC

## 2018-10-29 NOTE — Progress Notes (Signed)
Subjective:     Patient ID: Dawn Rodriguez , female    DOB: 1935/08/31 , 83 y.o.   MRN: [id number]   Chief Complaint  Patient presents with  . Abdominal Pain    having discharge, stomach pain  , started yesterday     HPI  Last night her urine was "slimy".  She is staying well hydrated.  She has not had a hysterectomy.    Abdominal Pain  This is a new problem. The current episode started 1 to 4 weeks ago. The onset quality is gradual. The problem occurs constantly. The problem has been unchanged. Pertinent negatives include no diarrhea, dysuria, headaches, nausea or vomiting. There is no history of colon cancer.    Past Medical History:  Diagnosis Date  . Arthritis   . Bilateral dry eyes   . Cancer ([place]) 02/2018   left breast cancer  . Cataracts, bilateral   . Chronic headache   . GERD (gastroesophageal reflux disease)   . Hypertension   . TMJ syndrome      Family History  Problem Relation Age of Onset  . Pneumonia Mother   . Breast cancer Cousin      Current Outpatient Medications:  .  alendronate (FOSAMAX) 70 MG tablet, Take 1 tablet (70 mg total) by mouth once a week. Take with a full glass of water on an empty stomach., Disp: 12 tablet, Rfl: 3 .  amLODipine (NORVASC) 2.5 MG tablet, TAKE 1 TABLET EVERY DAY, Disp: 90 tablet, Rfl: 1 .  Calcium Carb-Cholecalciferol (CALCIUM-VITAMIN D3) 600-400 MG-UNIT TABS, Take by mouth 2 (two) times daily., Disp: , Rfl:  .  calcium carbonate (TUMS - DOSED IN MG ELEMENTAL CALCIUM) 500 MG chewable tablet, Chew 1 tablet by mouth daily as needed for indigestion or heartburn., Disp: , Rfl:  .  Garlic 7782 MG CAPS, Take 1,000 mg by mouth daily. , Disp: , Rfl:  .  Glycerin-Polysorbate 80 (REFRESH DRY EYE THERAPY OP), Place 1-2 drops into both eyes 4 (four) times daily as needed (for dry eyes). , Disp: , Rfl:  .  ipratropium (ATROVENT) 0.06 % nasal spray, Place 2 sprays into both nostrils 4 (four) times daily., Disp: 15 mL, Rfl: 1 .  KRILL  OIL OMEGA-3 PO, Take 1 capsule by mouth daily., Disp: , Rfl:  .  Magnesium 250 MG TABS, Take 250 mg by mouth daily. , Disp: , Rfl:  .  omeprazole (PRILOSEC) 20 MG capsule, Take 1 capsule (20 mg total) by mouth daily., Disp: 90 capsule, Rfl: 0 .  potassium gluconate 595 MG TABS, Take 595 mg by mouth daily., Disp: , Rfl:  .  vitamin B-12 (CYANOCOBALAMIN) 1000 MCG tablet, Take 1,000 mcg by mouth daily., Disp: , Rfl:  .  letrozole (FEMARA) 2.5 MG tablet, Take 1 tablet (2.5 mg total) by mouth daily., Disp: 90 tablet, Rfl: 3   Allergies  Allergen Reactions  . Tobramycin Itching, Swelling and Rash    Tobramycin eye drops caused swelling, itching, drainage, rash and peeling of the skin     Review of Systems  Respiratory: Negative.   Cardiovascular: Negative for chest pain, palpitations and leg swelling.  Gastrointestinal: Positive for abdominal pain. Negative for diarrhea, nausea and vomiting.  Endocrine: Negative for polydipsia, polyphagia and polyuria.  Genitourinary: Positive for vaginal discharge. Negative for decreased urine volume, dysuria, urgency, vaginal bleeding and vaginal pain.  Neurological: Negative for dizziness and headaches.     Today's Vitals   10/29/18 1447  BP: 122/70  Pulse: 93  Temp: 98.7 F (37.1 C)  TempSrc: Oral  SpO2: 97%  Weight: 154 lb 6.4 oz (70 kg)   Body mass index is 26.92 kg/m.   Objective:  Physical Exam Vitals signs reviewed.  Constitutional:      Appearance: She is well-developed.  Abdominal:     General: Bowel sounds are normal.     Palpations: Abdomen is rigid.     Tenderness: There is abdominal tenderness. There is no right CVA tenderness.  Genitourinary:    Comments: Yellow discoloration to cervix and vaginal walls Skin:    General: Skin is warm and dry.  Neurological:     Mental Status: She is alert.         Assessment And Plan:     1. Stomach pain  Will check urinalysis  Pelvic exam done with speculum yellow color noted to  cervix and vaginal walls  Abdomen nontender - POCT Urinalysis Dipstick (59563) - Urine cytology ancillary only - Culture, Urine  2. Urine leukocytes  Moderate white cells, will send urine for culture will treat prophylactically - Culture, Urine - nitrofurantoin, macrocrystal-monohydrate, (MACROBID) 100 MG capsule; Take 1 capsule (100 mg total) by mouth 2 (two) times daily for 5 days.  Dispense: 10 capsule; Refill: 0 - CMP14 + Anion Gap - CBC no Diff  3. Encounter for screening  Treating prophylactically - nitrofurantoin, macrocrystal-monohydrate, (MACROBID) 100 MG capsule; Take 1 capsule (100 mg total) by mouth 2 (two) times daily for 5 days.  Dispense: 10 capsule; Refill: 0 - Cervicovaginal ancillary only - Culture, Urine     [name], FNP

## 2018-10-30 LAB — URINE CULTURE

## 2018-10-30 LAB — CBC
Hematocrit: 36.5 % (ref 34.0–46.6)
Hemoglobin: 12.2 g/dL (ref 11.1–15.9)
MCH: 28.6 pg (ref 26.6–33.0)
MCHC: 33.4 g/dL (ref 31.5–35.7)
MCV: 86 fL (ref 79–97)
PLATELETS: 273 10*3/uL (ref 150–450)
RBC: 4.27 x10E6/uL (ref 3.77–5.28)
RDW: 14 % (ref 11.7–15.4)
WBC: 8.5 10*3/uL (ref 3.4–10.8)

## 2018-10-30 LAB — CMP14 + ANION GAP
ALBUMIN: 4.4 g/dL (ref 3.6–4.6)
ALK PHOS: 74 IU/L (ref 39–117)
ALT: 10 IU/L (ref 0–32)
ANION GAP: 16 mmol/L (ref 10.0–18.0)
AST: 14 IU/L (ref 0–40)
Albumin/Globulin Ratio: 1.7 (ref 1.2–2.2)
BILIRUBIN TOTAL: 0.8 mg/dL (ref 0.0–1.2)
BUN / CREAT RATIO: 12 (ref 12–28)
BUN: 9 mg/dL (ref 8–27)
CALCIUM: 9.6 mg/dL (ref 8.7–10.3)
CHLORIDE: 102 mmol/L (ref 96–106)
CO2: 25 mmol/L (ref 20–29)
CREATININE: 0.78 mg/dL (ref 0.57–1.00)
GFR calc Af Amer: 82 mL/min/{1.73_m2} (ref >59)
GFR calc non Af Amer: 72 mL/min/{1.73_m2} (ref >59)
Globulin, Total: 2.6 g/dL (ref 1.5–4.5)
Glucose: 94 mg/dL (ref 65–99)
Potassium: 3.9 mmol/L (ref 3.5–5.2)
SODIUM: 143 mmol/L (ref 134–144)
TOTAL PROTEIN: 7 g/dL (ref 6.0–8.5)

## 2018-10-31 LAB — CERVICOVAGINAL ANCILLARY ONLY
BACTERIAL VAGINITIS: NEGATIVE
Chlamydia: NEGATIVE
NEISSERIA GONORRHEA: NEGATIVE
TRICH (WINDOWPATH): NEGATIVE

## 2018-11-05 ENCOUNTER — Ambulatory Visit: Payer: Medicare PPO | Admitting: Nurse Practitioner

## 2018-11-12 ENCOUNTER — Other Ambulatory Visit (HOSPITAL_COMMUNITY)
Admission: RE | Admit: 2018-11-12 | Discharge: 2018-11-12 | Disposition: A | Discharge status: Home / Self Care | Payer: Medicare PPO | Source: Ambulatory Visit | Attending: Nurse Practitioner | Admitting: Nurse Practitioner

## 2018-11-12 ENCOUNTER — Other Ambulatory Visit: Payer: Self-pay

## 2018-11-12 ENCOUNTER — Encounter: Payer: Self-pay | Admitting: Nurse Practitioner

## 2018-11-12 ENCOUNTER — Ambulatory Visit: Payer: Medicare PPO | Admitting: Nurse Practitioner

## 2018-11-12 VITALS — BP 130/70 | HR 70 | Temp 97.6°F | Ht 63.8 in | Wt 156.2 lb

## 2018-11-12 DIAGNOSIS — Z124 Encounter for screening for malignant neoplasm of cervix: Secondary | ICD-10-CM | POA: Insufficient documentation

## 2018-11-12 DIAGNOSIS — N898 Other specified noninflammatory disorders of vagina: Secondary | ICD-10-CM | POA: Insufficient documentation

## 2018-11-12 HISTORY — DX: Other specified noninflammatory disorders of vagina: N89.8

## 2018-11-12 NOTE — Progress Notes (Signed)
Subjective:     Patient ID: Dawn Rodriguez , female    DOB: 1936-01-03 , 83 y.o.   MRN: [id number]   Chief Complaint  Patient presents with  . Vaginal Discharge    HPI  Vaginal Discharge  The patient's primary symptoms include vaginal discharge. She is not pregnant. Pertinent negatives include no abdominal pain.     Past Medical History:  Diagnosis Date  . Arthritis   . Bilateral dry eyes   . Cancer ([place]) 02/2018   left breast cancer  . Cataracts, bilateral   . Chronic headache   . GERD (gastroesophageal reflux disease)   . Hypertension   . TMJ syndrome      Family History  Problem Relation Age of Onset  . Pneumonia Mother   . Breast cancer Cousin      Current Outpatient Medications:  .  alendronate (FOSAMAX) 70 MG tablet, Take 1 tablet (70 mg total) by mouth once a week. Take with a full glass of water on an empty stomach., Disp: 12 tablet, Rfl: 3 .  amLODipine (NORVASC) 2.5 MG tablet, TAKE 1 TABLET EVERY DAY, Disp: 90 tablet, Rfl: 1 .  Calcium Carb-Cholecalciferol (CALCIUM-VITAMIN D3) 600-400 MG-UNIT TABS, Take by mouth 2 (two) times daily., Disp: , Rfl:  .  calcium carbonate (TUMS - DOSED IN MG ELEMENTAL CALCIUM) 500 MG chewable tablet, Chew 1 tablet by mouth daily as needed for indigestion or heartburn., Disp: , Rfl:  .  Garlic 8101 MG CAPS, Take 1,000 mg by mouth daily. , Disp: , Rfl:  .  Glycerin-Polysorbate 80 (REFRESH DRY EYE THERAPY OP), Place 1-2 drops into both eyes 4 (four) times daily as needed (for dry eyes). , Disp: , Rfl:  .  ipratropium (ATROVENT) 0.06 % nasal spray, Place 2 sprays into both nostrils 4 (four) times daily., Disp: 15 mL, Rfl: 1 .  KRILL OIL OMEGA-3 PO, Take 1 capsule by mouth daily., Disp: , Rfl:  .  letrozole (FEMARA) 2.5 MG tablet, Take 1 tablet (2.5 mg total) by mouth daily., Disp: 90 tablet, Rfl: 3 .  Magnesium 250 MG TABS, Take 250 mg by mouth daily. , Disp: , Rfl:  .  potassium gluconate 595 MG TABS, Take 595 mg by mouth daily.,  Disp: , Rfl:  .  vitamin B-12 (CYANOCOBALAMIN) 1000 MCG tablet, Take 1,000 mcg by mouth daily., Disp: , Rfl:  .  omeprazole (PRILOSEC) 20 MG capsule, Take 1 capsule (20 mg total) by mouth daily., Disp: 90 capsule, Rfl: 0   Allergies  Allergen Reactions  . Tobramycin Itching, Swelling and Rash    Tobramycin eye drops caused swelling, itching, drainage, rash and peeling of the skin     Review of Systems  Constitutional: Negative for fatigue.  Respiratory: Negative.  Negative for cough.   Cardiovascular: Negative for palpitations.  Gastrointestinal: Negative for abdominal pain.  Endocrine: Negative for polydipsia, polyphagia and polyuria.  Genitourinary: Positive for vaginal discharge.     Today's Vitals   11/12/18 1005  BP: 130/70  Pulse: 70  Temp: 97.6 F (36.4 C)  TempSrc: Oral  SpO2: 96%  Weight: 156 lb 3.2 oz (70.9 kg)  Height: 5' 3.8" (1.621 m)   Body mass index is 26.98 kg/m.   Objective:  Physical Exam Vitals signs reviewed.  Constitutional:      Appearance: Normal appearance.  Genitourinary:    Vagina: Normal.     Cervix: Normal and dilated.     Comments: Mild yellow discoloration to right vaginal wall.  Neurological:     Mental Status: She is alert.         Assessment And Plan:     1. Vaginal discharge  No longer has reported vaginal discharge,   None noted on pelvic exam - Cytology -Pap Smear  2. Encounter for Papanicolaou smear of cervix  No abnormal findings noted on physical exam - Cytology -Pap Smear       [name], FNP

## 2018-11-14 LAB — CYTOLOGY - PAP: Diagnosis: NEGATIVE

## 2018-11-18 ENCOUNTER — Encounter: Payer: Self-pay | Admitting: Nurse Practitioner

## 2018-11-28 ENCOUNTER — Encounter: Payer: Self-pay | Admitting: Nurse Practitioner

## 2018-12-15 ENCOUNTER — Other Ambulatory Visit: Payer: Self-pay | Admitting: Internal Medicine

## 2018-12-31 ENCOUNTER — Ambulatory Visit: Payer: Self-pay | Admitting: Radiation Oncology

## 2019-03-11 DIAGNOSIS — H16223 Keratoconjunctivitis sicca, not specified as Sjogren's, bilateral: Secondary | ICD-10-CM | POA: Diagnosis not present

## 2019-03-11 DIAGNOSIS — H40013 Open angle with borderline findings, low risk, bilateral: Secondary | ICD-10-CM | POA: Diagnosis not present

## 2019-03-11 DIAGNOSIS — H35371 Puckering of macula, right eye: Secondary | ICD-10-CM | POA: Diagnosis not present

## 2019-03-11 DIAGNOSIS — H1131 Conjunctival hemorrhage, right eye: Secondary | ICD-10-CM | POA: Diagnosis not present

## 2019-03-11 DIAGNOSIS — H1851 Endothelial corneal dystrophy: Secondary | ICD-10-CM | POA: Diagnosis not present

## 2019-03-11 DIAGNOSIS — H1045 Other chronic allergic conjunctivitis: Secondary | ICD-10-CM | POA: Diagnosis not present

## 2019-03-11 DIAGNOSIS — H18413 Arcus senilis, bilateral: Secondary | ICD-10-CM | POA: Diagnosis not present

## 2019-03-11 DIAGNOSIS — H04123 Dry eye syndrome of bilateral lacrimal glands: Secondary | ICD-10-CM | POA: Diagnosis not present

## 2019-03-11 DIAGNOSIS — H11153 Pinguecula, bilateral: Secondary | ICD-10-CM | POA: Diagnosis not present

## 2019-05-24 ENCOUNTER — Other Ambulatory Visit: Payer: Self-pay | Admitting: Nurse Practitioner

## 2019-05-30 ENCOUNTER — Encounter: Payer: Medicare PPO | Admitting: Nurse Practitioner

## 2019-05-30 ENCOUNTER — Ambulatory Visit: Payer: Medicare PPO

## 2019-06-06 ENCOUNTER — Ambulatory Visit: Payer: Medicare PPO

## 2019-06-06 ENCOUNTER — Ambulatory Visit
Admission: RE | Admit: 2019-06-06 | Discharge: 2019-06-06 | Disposition: A | Discharge status: Home / Self Care | Payer: Medicare PPO | Source: Ambulatory Visit | Attending: Adult Health | Admitting: Adult Health

## 2019-06-06 ENCOUNTER — Other Ambulatory Visit: Payer: Self-pay

## 2019-06-06 DIAGNOSIS — Z170 Estrogen receptor positive status [ER+]: Secondary | ICD-10-CM

## 2019-06-06 DIAGNOSIS — R922 Inconclusive mammogram: Secondary | ICD-10-CM | POA: Diagnosis not present

## 2019-06-06 DIAGNOSIS — C50212 Malignant neoplasm of upper-inner quadrant of left female breast: Secondary | ICD-10-CM

## 2019-06-06 HISTORY — DX: Personal history of irradiation: Z92.3

## 2019-06-11 ENCOUNTER — Telehealth: Payer: Self-pay | Admitting: Hematology and Oncology

## 2019-06-11 NOTE — Telephone Encounter (Signed)
Returned patient's phone call regarding rescheduling an appointment, patient might keep the appointment for now and will call back to clarify.

## 2019-06-13 ENCOUNTER — Inpatient Hospital Stay: Payer: Medicare PPO | Attending: Hematology and Oncology | Admitting: Hematology and Oncology

## 2019-06-13 NOTE — Assessment & Plan Note (Deleted)
04/06/2018:Left lumpectomy: IDC with extravasated mucin, grade 2, 2.8 cm, intermediate grade DCIS, margins negative, ER 90%, PR 5%, HER-2 negative ratio 1.19, Ki-67 5%, T2N0 stage IA AJCC 8 Adjuvant radiation therapy 05/16/2018 to 06/11/2018  Treatment plan: Letrozole 2.5 mg daily start 07/16/2018  Letrozole Toxicities:  Osteoporosis: T score -2.9: I prescribed her Fosamax.  She takes calcium and vitamin D.  Breast cancer surveillance: 1.  Mammogram 06/06/2019: Benign breast density category C 2.  Breast exam 06/13/2019: Benign  Return to clinic in 3 months for survivorship care plan visit 

## 2019-06-19 ENCOUNTER — Ambulatory Visit: Payer: Medicare PPO

## 2019-06-19 ENCOUNTER — Encounter: Payer: Self-pay | Admitting: Nurse Practitioner

## 2019-06-19 ENCOUNTER — Ambulatory Visit (INDEPENDENT_AMBULATORY_CARE_PROVIDER_SITE_OTHER): Payer: Medicare PPO

## 2019-06-19 ENCOUNTER — Other Ambulatory Visit: Payer: Self-pay

## 2019-06-19 ENCOUNTER — Ambulatory Visit (INDEPENDENT_AMBULATORY_CARE_PROVIDER_SITE_OTHER): Payer: Medicare PPO | Admitting: Nurse Practitioner

## 2019-06-19 VITALS — BP 132/70 | HR 60 | Temp 98.5°F | Ht 64.0 in | Wt 167.0 lb

## 2019-06-19 VITALS — BP 132/70 | HR 60 | Temp 98.5°F | Ht 64.0 in | Wt 167.8 lb

## 2019-06-19 DIAGNOSIS — Z853 Personal history of malignant neoplasm of breast: Secondary | ICD-10-CM

## 2019-06-19 DIAGNOSIS — Z0000 Encounter for general adult medical examination without abnormal findings: Secondary | ICD-10-CM

## 2019-06-19 DIAGNOSIS — Z23 Encounter for immunization: Secondary | ICD-10-CM

## 2019-06-19 MED ORDER — PREVNAR 13 IM SUSP: 0.5000 mL | INTRAMUSCULAR | 0 refills | Status: AC

## 2019-06-19 NOTE — Patient Instructions (Signed)
Health Maintenance After Age 81 After age 81, you are at a higher risk for certain long-term diseases and infections as well as injuries from falls. Falls are a major cause of broken bones and head injuries in people who are older than age 81. Getting regular preventive care can help to keep you healthy and well. Preventive care includes getting regular testing and making lifestyle changes as recommended by your health care provider. Talk with your health care provider about:  Which screenings and tests you should have. A screening is a test that checks for a disease when you have no symptoms.  A diet and exercise plan that is right for you. What should I know about screenings and tests to prevent falls? Screening and testing are the best ways to find a health problem early. Early diagnosis and treatment give you the best chance of managing medical conditions that are common after age 81. Certain conditions and lifestyle choices may make you more likely to have a fall. Your health care provider may recommend:  Regular vision checks. Poor vision and conditions such as cataracts can make you more likely to have a fall. If you wear glasses, make sure to get your prescription updated if your vision changes.  Medicine review. Work with your health care provider to regularly review all of the medicines you are taking, including over-the-counter medicines. Ask your health care provider about any side effects that may make you more likely to have a fall. Tell your health care provider if any medicines that you take make you feel dizzy or sleepy.  Osteoporosis screening. Osteoporosis is a condition that causes the bones to get weaker. This can make the bones weak and cause them to break more easily.  Blood pressure screening. Blood pressure changes and medicines to control blood pressure can make you feel dizzy.  Strength and balance checks. Your health care provider may recommend certain tests to check your  strength and balance while standing, walking, or changing positions.  Foot health exam. Foot pain and numbness, as well as not wearing proper footwear, can make you more likely to have a fall.  Depression screening. You may be more likely to have a fall if you have a fear of falling, feel emotionally low, or feel unable to do activities that you used to do.  Alcohol use screening. Using too much alcohol can affect your balance and may make you more likely to have a fall. What actions can I take to lower my risk of falls? General instructions  Talk with your health care provider about your risks for falling. Tell your health care provider if: ? You fall. Be sure to tell your health care provider about all falls, even ones that seem minor. ? You feel dizzy, sleepy, or off-balance.  Take over-the-counter and prescription medicines only as told by your health care provider. These include any supplements.  Eat a healthy diet and maintain a healthy weight. A healthy diet includes low-fat dairy products, low-fat (lean) meats, and fiber from whole grains, beans, and lots of fruits and vegetables. Home safety  Remove any tripping hazards, such as rugs, cords, and clutter.  Install safety equipment such as grab bars in bathrooms and safety rails on stairs.  Keep rooms and walkways well-lit. Activity   Follow a regular exercise program to stay fit. This will help you maintain your balance. Ask your health care provider what types of exercise are appropriate for you.  If you need a cane or   walker, use it as recommended by your health care provider.  Wear supportive shoes that have nonskid soles. Lifestyle  Do not drink alcohol if your health care provider tells you not to drink.  If you drink alcohol, limit how much you have: ? 0-1 drink a day for women. ? 0-2 drinks a day for men.  Be aware of how much alcohol is in your drink. In the [place] one drink equals one typical bottle of beer (12  oz), one-half glass of wine (5 oz), or one shot of hard liquor (1 oz).  Do not use any products that contain nicotine or tobacco, such as cigarettes and e-cigarettes. If you need help quitting, ask your health care provider. Summary  Having a healthy lifestyle and getting preventive care can help to protect your health and wellness after age 81.  Screening and testing are the best way to find a health problem early and help you avoid having a fall. Early diagnosis and treatment give you the best chance for managing medical conditions that are more common for people who are older than age 81.  Falls are a major cause of broken bones and head injuries in people who are older than age 81. Take precautions to prevent a fall at home.  Work with your health care provider to learn what changes you can make to improve your health and wellness and to prevent falls. This information is not intended to replace advice given to you by your health care provider. Make sure you discuss any questions you have with your health care provider. Document Released: 06/21/2017 Document Revised: 11/29/2018 Document Reviewed: 06/21/2017 Elsevier Patient Education  2020 Else[employer] Inc[employer]  

## 2019-06-19 NOTE — Patient Instructions (Signed)
Dawn Rodriguez , Thank you for taking time to come for your Medicare Wellness Visit. I appreciate your ongoing commitment to your health goals. Please review the following plan we discussed and let me know if I can assist you in the future.   Screening recommendations/referrals: Colonoscopy: 02/2017 Mammogram: 05/2019 Bone Density: 06/2017 Recommended yearly ophthalmology/optometry visit for glaucoma screening and checkup Recommended yearly dental visit for hygiene and checkup  Vaccinations: Influenza vaccine: declines Pneumococcal vaccine: sent to pharmacy Tdap vaccine: 04/2013 Shingles vaccine: discussed    Advanced directives: Advance directive discussed with you today. Even though you declined this today please call our office should you change your mind and we can give you the proper paperwork for you to fill out.   Conditions/risks identified: overweight  Next appointment: 12/18/2019 at 3:00   Preventive Care 81 Years and Older, Female Preventive care refers to lifestyle choices and visits with your health care provider that can promote health and wellness. What does preventive care include?  A yearly physical exam. This is also called an annual well check.  Dental exams once or twice a year.  Routine eye exams. Ask your health care provider how often you should have your eyes checked.  Personal lifestyle choices, including:  Daily care of your teeth and gums.  Regular physical activity.  Eating a healthy diet.  Avoiding tobacco and drug use.  Limiting alcohol use.  Practicing safe sex.  Taking low-dose aspirin every day.  Taking vitamin and mineral supplements as recommended by your health care provider. What happens during an annual well check? The services and screenings done by your health care provider during your annual well check will depend on your age, overall health, lifestyle risk factors, and family history of disease. Counseling  Your health care  provider may ask you questions about your:  Alcohol use.  Tobacco use.  Drug use.  Emotional well-being.  Home and relationship well-being.  Sexual activity.  Eating habits.  History of falls.  Memory and ability to understand (cognition).  Work and work Statistician.  Reproductive health. Screening  You may have the following tests or measurements:  Height, weight, and BMI.  Blood pressure.  Lipid and cholesterol levels. These may be checked every 5 years, or more frequently if you are over 62 years old.  Skin check.  Lung cancer screening. You may have this screening every year starting at age 13 if you have a 30-pack-year history of smoking and currently smoke or have quit within the past 15 years.  Fecal occult blood test (FOBT) of the stool. You may have this test every year starting at age 72.  Flexible sigmoidoscopy or colonoscopy. You may have a sigmoidoscopy every 5 years or a colonoscopy every 10 years starting at age 15.  Hepatitis C blood test.  Hepatitis B blood test.  Sexually transmitted disease (STD) testing.  Diabetes screening. This is done by checking your blood sugar (glucose) after you have not eaten for a while (fasting). You may have this done every 1-3 years.  Bone density scan. This is done to screen for osteoporosis. You may have this done starting at age 56.  Mammogram. This may be done every 1-2 years. Talk to your health care provider about how often you should have regular mammograms. Talk with your health care provider about your test results, treatment options, and if necessary, the need for more tests. Vaccines  Your health care provider may recommend certain vaccines, such as:  Influenza vaccine. This is  recommended every year.  Tetanus, diphtheria, and acellular pertussis (Tdap, Td) vaccine. You may need a Td booster every 10 years.  Zoster vaccine. You may need this after age 61.  Pneumococcal 13-valent conjugate (PCV13)  vaccine. One dose is recommended after age 37.  Pneumococcal polysaccharide (PPSV23) vaccine. One dose is recommended after age 42. Talk to your health care provider about which screenings and vaccines you need and how often you need them. This information is not intended to replace advice given to you by your health care provider. Make sure you discuss any questions you have with your health care provider. Document Released: 09/04/2015 Document Revised: 04/27/2016 Document Reviewed: 06/09/2015 Elsevier Interactive Patient Education  2017 [place] Prevention in the Home Falls can cause injuries. They can happen to people of all ages. There are many things you can do to make your home safe and to help prevent falls. What can I do on the outside of my home?  Regularly fix the edges of walkways and driveways and fix any cracks.  Remove anything that might make you trip as you walk through a door, such as a raised step or threshold.  Trim any bushes or trees on the path to your home.  Use bright outdoor lighting.  Clear any walking paths of anything that might make someone trip, such as rocks or tools.  Regularly check to see if handrails are loose or broken. Make sure that both sides of any steps have handrails.  Any raised decks and porches should have guardrails on the edges.  Have any leaves, snow, or ice cleared regularly.  Use sand or salt on walking paths during winter.  Clean up any spills in your garage right away. This includes oil or grease spills. What can I do in the bathroom?  Use night lights.  Install grab bars by the toilet and in the tub and shower. Do not use towel bars as grab bars.  Use non-skid mats or decals in the tub or shower.  If you need to sit down in the shower, use a plastic, non-slip stool.  Keep the floor dry. Clean up any water that spills on the floor as soon as it happens.  Remove soap buildup in the tub or shower regularly.   Attach bath mats securely with double-sided non-slip rug tape.  Do not have throw rugs and other things on the floor that can make you trip. What can I do in the bedroom?  Use night lights.  Make sure that you have a light by your bed that is easy to reach.  Do not use any sheets or blankets that are too big for your bed. They should not hang down onto the floor.  Have a firm chair that has side arms. You can use this for support while you get dressed.  Do not have throw rugs and other things on the floor that can make you trip. What can I do in the kitchen?  Clean up any spills right away.  Avoid walking on wet floors.  Keep items that you use a lot in easy-to-reach places.  If you need to reach something above you, use a strong step stool that has a grab bar.  Keep electrical cords out of the way.  Do not use floor polish or wax that makes floors slippery. If you must use wax, use non-skid floor wax.  Do not have throw rugs and other things on the floor that can make you trip.  What can I do with my stairs?  Do not leave any items on the stairs.  Make sure that there are handrails on both sides of the stairs and use them. Fix handrails that are broken or loose. Make sure that handrails are as long as the stairways.  Check any carpeting to make sure that it is firmly attached to the stairs. Fix any carpet that is loose or worn.  Avoid having throw rugs at the top or bottom of the stairs. If you do have throw rugs, attach them to the floor with carpet tape.  Make sure that you have a light switch at the top of the stairs and the bottom of the stairs. If you do not have them, ask someone to add them for you. What else can I do to help prevent falls?  Wear shoes that:  Do not have high heels.  Have rubber bottoms.  Are comfortable and fit you well.  Are closed at the toe. Do not wear sandals.  If you use a stepladder:  Make sure that it is fully opened. Do not climb  a closed stepladder.  Make sure that both sides of the stepladder are locked into place.  Ask someone to hold it for you, if possible.  Clearly mark and make sure that you can see:  Any grab bars or handrails.  First and last steps.  Where the edge of each step is.  Use tools that help you move around (mobility aids) if they are needed. These include:  Canes.  Walkers.  Scooters.  Crutches.  Turn on the lights when you go into a dark area. Replace any light bulbs as soon as they burn out.  Set up your furniture so you have a clear path. Avoid moving your furniture around.  If any of your floors are uneven, fix them.  If there are any pets around you, be aware of where they are.  Review your medicines with your doctor. Some medicines can make you feel dizzy. This can increase your chance of falling. Ask your doctor what other things that you can do to help prevent falls. This information is not intended to replace advice given to you by your health care provider. Make sure you discuss any questions you have with your health care provider. Document Released: 06/04/2009 Document Revised: 01/14/2016 Document Reviewed: 09/12/2014 Elsevier Interactive Patient Education  2017 [employer].

## 2019-06-19 NOTE — Progress Notes (Signed)
Subjective:     Patient ID: Dawn Rodriguez , female    DOB: 30-Jul-1936 , 83 y.o.   MRN: [id number]   Chief Complaint  Patient presents with  . Annual Exam    HPI  Here for HM   The patient states she uses post menopausal status for birth control.  Mammogram last done 06/06/2019.  Negative for: breast discharge, breast lump(s), breast pain and breast self exam.  Pertinent negatives include abnormal bleeding (hematology), anxiety, decreased libido, depression, difficulty falling sleep, dyspareunia, history of infertility, nocturia, sexual dysfunction, sleep disturbances, urinary incontinence, urinary urgency, vaginal discharge and vaginal itching. Diet regular. The patient states her exercise level is  moderate.     The patient's tobacco use is:  Social History   Tobacco Use  Smoking Status Never Smoker  Smokeless Tobacco Never Used   She has been exposed to passive smoke. The patient's alcohol use is:  Social History   Substance and Sexual Activity  Alcohol Use No   Additional information: Last PAP 11/12/2018, none upcoming due to age  Past Medical History:  Diagnosis Date  . Arthritis   . Bilateral dry eyes   . Cancer ([place]) 02/2018   left breast cancer  . Cataracts, bilateral   . Chronic headache   . GERD (gastroesophageal reflux disease)   . Hypertension   . Personal history of radiation therapy   . TMJ syndrome      Family History  Problem Relation Age of Onset  . Pneumonia Mother   . Breast cancer Cousin      Current Outpatient Medications:  .  alendronate (FOSAMAX) 70 MG tablet, Take 1 tablet (70 mg total) by mouth once a week. Take with a full glass of water on an empty stomach. (Patient not taking: Reported on 06/19/2019), Disp: 12 tablet, Rfl: 3 .  amLODipine (NORVASC) 2.5 MG tablet, TAKE 1 TABLET EVERY DAY, Disp: 90 tablet, Rfl: 1 .  Calcium Carb-Cholecalciferol (CALCIUM-VITAMIN D3) 600-400 MG-UNIT TABS, Take by mouth 2 (two) times daily., Disp: ,  Rfl:  .  calcium carbonate (TUMS - DOSED IN MG ELEMENTAL CALCIUM) 500 MG chewable tablet, Chew 1 tablet by mouth daily as needed for indigestion or heartburn., Disp: , Rfl:  .  Garlic 123XX123 MG CAPS, Take 1,000 mg by mouth daily. , Disp: , Rfl:  .  Glycerin-Polysorbate 80 (REFRESH DRY EYE THERAPY OP), Place 1-2 drops into both eyes 4 (four) times daily as needed (for dry eyes). , Disp: , Rfl:  .  ipratropium (ATROVENT) 0.06 % nasal spray, Place 2 sprays into both nostrils 4 (four) times daily., Disp: 15 mL, Rfl: 1 .  KRILL OIL OMEGA-3 PO, Take 1 capsule by mouth daily., Disp: , Rfl:  .  letrozole (FEMARA) 2.5 MG tablet, Take 1 tablet (2.5 mg total) by mouth daily., Disp: 90 tablet, Rfl: 3 .  Magnesium 250 MG TABS, Take 250 mg by mouth daily. , Disp: , Rfl:  .  omeprazole (PRILOSEC) 20 MG capsule, Take 1 capsule (20 mg total) by mouth daily., Disp: 90 capsule, Rfl: 0 .  pneumococcal 13-valent conjugate vaccine (PREVNAR 13) SUSP injection, Inject 0.5 mLs into the muscle tomorrow at 10 am for 1 dose., Disp: 0.5 mL, Rfl: 0 .  potassium gluconate 595 MG TABS, Take 595 mg by mouth daily., Disp: , Rfl:  .  vitamin B-12 (CYANOCOBALAMIN) 1000 MCG tablet, Take 1,000 mcg by mouth daily., Disp: , Rfl:    Allergies  Allergen Reactions  . Tobramycin  Itching, Swelling and Rash    Tobramycin eye drops caused swelling, itching, drainage, rash and peeling of the skin     Review of Systems  Constitutional: Negative.   HENT: Negative.   Eyes: Negative.   Respiratory: Negative.   Cardiovascular: Negative.  Negative for chest pain, palpitations and leg swelling.  Gastrointestinal: Negative.   Endocrine: Negative.  Negative for polydipsia, polyphagia and polyuria.  Genitourinary: Negative.   Musculoskeletal: Negative.   Skin: Negative.   Allergic/Immunologic: Negative.   Neurological: Negative.  Negative for dizziness and headaches.  Hematological: Negative.   Psychiatric/Behavioral: Negative.       Today's Vitals   06/19/19 1540  BP: 132/70  Pulse: 60  Temp: 98.5 F (36.9 C)  TempSrc: Oral  Weight: 167 lb (75.8 kg)  Height: 5\' 4"  (1.626 m)  PainSc: 5    Body mass index is 28.67 kg/m.   Objective:  Physical Exam Vitals signs reviewed.  Constitutional:      General: She is not in acute distress.    Appearance: Normal appearance. She is well-developed.  HENT:     Head: Normocephalic and atraumatic.     Right Ear: Hearing normal.     Left Ear: Hearing normal.  Eyes:     General: Lids are normal.     Conjunctiva/sclera: Conjunctivae normal.     Pupils: Pupils are equal, round, and reactive to light.     Funduscopic exam:    Right eye: No papilledema.        Left eye: No papilledema.  Neck:     Musculoskeletal: Full passive range of motion without pain, normal range of motion and neck supple.     Thyroid: No thyroid mass.     Vascular: No carotid bruit.  Cardiovascular:     Rate and Rhythm: Normal rate and regular rhythm.     Pulses: Normal pulses.     Heart sounds: Normal heart sounds. No murmur.  Pulmonary:     Effort: Pulmonary effort is normal.     Breath sounds: Normal breath sounds.  Abdominal:     General: Abdomen is flat. Bowel sounds are normal.     Palpations: Abdomen is soft.  Musculoskeletal: Normal range of motion.        General: No swelling.     Right lower leg: No edema.     Left lower leg: No edema.  Skin:    General: Skin is warm and dry.     Capillary Refill: Capillary refill takes less than 2 seconds.  Neurological:     General: No focal deficit present.     Mental Status: She is alert and oriented to person, place, and time.     Cranial Nerves: No cranial nerve deficit.     Sensory: No sensory deficit.  Psychiatric:        Mood and Affect: Mood normal.        Behavior: Behavior normal.        Thought Content: Thought content normal.        Judgment: Judgment normal.         Assessment And Plan:     1. Health maintenance  examination . Behavior modifications discussed and diet history reviewed.   . Pt will continue to exercise regularly and modify diet with low GI, plant based foods and decrease intake of processed foods.  . Recommend intake of daily multivitamin, Vitamin D, and calcium.  . Recommend for preventive screenings, as well as recommend immunizations that include  influenza, TDAP (up to date) and she has had her prevnar sent to the pharmacy  2. History of left breast cancer  She is taking letrozole daily - she does describe hot flashes   Continue follow up with Oncology  Normal mammogram done with expected surgical clips    [name], FNP    THE PATIENT IS ENCOURAGED TO PRACTICE SOCIAL DISTANCING DUE TO THE COVID-19 PANDEMIC.

## 2019-06-19 NOTE — Progress Notes (Signed)
Subjective:   Dawn Rodriguez is a 83 y.o. female who presents for Medicare Annual (Subsequent) preventive examination.  Review of Systems:  n/a Cardiac Risk Factors include: advanced age (>12men, >41 women)     Objective:     Vitals: BP 132/70 (BP Location: Left Arm, Patient Position: Sitting, Cuff Size: Normal)   Pulse 60   Temp 98.5 F (36.9 C) (Oral)   Ht 5\' 4"  (1.626 m)   Wt 167 lb 12.8 oz (76.1 kg)   SpO2 98%   BMI 28.80 kg/m   Body mass index is 28.8 kg/m.  Advanced Directives 06/19/2019 07/16/2018 05/23/2018 04/19/2018 04/10/2018 04/06/2018 04/02/2018  Does Patient Have a Medical Advance Directive? No No No No No No No  Would patient like information on creating a medical advance directive? - - Yes (MAU/Ambulatory/Procedural Areas - Information given) - No - Patient declined No - Patient declined No - Patient declined    Tobacco Social History   Tobacco Use  Smoking Status Never Smoker  Smokeless Tobacco Never Used     Counseling given: Not Answered   Clinical Intake:  Pre-visit preparation completed: Yes  Pain : 0-10 Pain Score: 5  Pain Type: Chronic pain Pain Location: Generalized(hands mainly) Pain Descriptors / Indicators: Aching, Burning Pain Onset: More than a month ago Pain Frequency: Intermittent Pain Relieving Factors: arthritic gloves, APAP Effect of Pain on Daily Activities: unable to open bottles and jars  Pain Relieving Factors: arthritic gloves, APAP  Nutritional Status: BMI 25 -29 Overweight Nutritional Risks: None Diabetes: No  How often do you need to have someone help you when you read instructions, pamphlets, or other written materials from your doctor or pharmacy?: 1 - Never What is the last grade level you completed in school?: Master's Degree  Interpreter Needed?: No  Information entered by :: NAllen LPN  Past Medical History:  Diagnosis Date  . Arthritis   . Bilateral dry eyes   . Cancer ([place]) 02/2018   left  breast cancer  . Cataracts, bilateral   . Chronic headache   . GERD (gastroesophageal reflux disease)   . Hypertension   . Personal history of radiation therapy   . TMJ syndrome    Past Surgical History:  Procedure Laterality Date  . BREAST BIOPSY Right 2012   stereotatic biopsy  . BREAST LUMPECTOMY Left 04/06/2018   Procedure: LEFT BREAST LUMPECTOMY;  Surgeon: Excell Seltzer, MD;  Location: [facility];  Service: General;  Laterality: Left;  . CATARACT EXTRACTION     Family History  Problem Relation Age of Onset  . Pneumonia Mother   . Breast cancer Cousin    Social History   Socioeconomic History  . Marital status: Divorced    Spouse name: Not on file  . Number of children: 2  . Years of education: Not on file  . Highest education level: Not on file  Occupational History  . Occupation: retired  Scientific laboratory technician  . Financial resource strain: Not hard at all  . Food insecurity    Worry: Never true    Inability: Never true  . Transportation needs    Medical: No    Non-medical: No  Tobacco Use  . Smoking status: Never Smoker  . Smokeless tobacco: Never Used  Substance and Sexual Activity  . Alcohol use: No  . Drug use: No  . Sexual activity: Not Currently  Lifestyle  . Physical activity    Days per week: 7 days    Minutes per  session: 30 min  . Stress: Not at all  Relationships  . Social Herbalist on phone: Not on file    Gets together: Not on file    Attends religious service: Not on file    Active member of club or organization: Not on file    Attends meetings of clubs or organizations: Not on file    Relationship status: Not on file  Other Topics Concern  . Not on file  Social History Narrative  . Not on file    Outpatient Encounter Medications as of 06/19/2019  Medication Sig  . amLODipine (NORVASC) 2.5 MG tablet TAKE 1 TABLET EVERY DAY  . Calcium Carb-Cholecalciferol (CALCIUM-VITAMIN D3) 600-400 MG-UNIT TABS Take by mouth  2 (two) times daily.  . calcium carbonate (TUMS - DOSED IN MG ELEMENTAL CALCIUM) 500 MG chewable tablet Chew 1 tablet by mouth daily as needed for indigestion or heartburn.  . Garlic 123XX123 MG CAPS Take 1,000 mg by mouth daily.   . Glycerin-Polysorbate 80 (REFRESH DRY EYE THERAPY OP) Place 1-2 drops into both eyes 4 (four) times daily as needed (for dry eyes).   Marland Kitchen ipratropium (ATROVENT) 0.06 % nasal spray Place 2 sprays into both nostrils 4 (four) times daily.  Marland Kitchen KRILL OIL OMEGA-3 PO Take 1 capsule by mouth daily.  Marland Kitchen letrozole (FEMARA) 2.5 MG tablet Take 1 tablet (2.5 mg total) by mouth daily.  . Magnesium 250 MG TABS Take 250 mg by mouth daily.   . potassium gluconate 595 MG TABS Take 595 mg by mouth daily.  . vitamin B-12 (CYANOCOBALAMIN) 1000 MCG tablet Take 1,000 mcg by mouth daily.  Marland Kitchen alendronate (FOSAMAX) 70 MG tablet Take 1 tablet (70 mg total) by mouth once a week. Take with a full glass of water on an empty stomach. (Patient not taking: Reported on 06/19/2019)  . omeprazole (PRILOSEC) 20 MG capsule Take 1 capsule (20 mg total) by mouth daily.  . pneumococcal 13-valent conjugate vaccine (PREVNAR 13) SUSP injection Inject 0.5 mLs into the muscle tomorrow at 10 am for 1 dose.   No facility-administered encounter medications on file as of 06/19/2019.     Activities of Daily Living In your present state of health, do you have any difficulty performing the following activities: 06/19/2019  Hearing? N  Vision? N  Difficulty concentrating or making decisions? N  Walking or climbing stairs? N  Dressing or bathing? N  Doing errands, shopping? N  Preparing Food and eating ? N  Using the Toilet? N  In the past six months, have you accidently leaked urine? Y  Comment with coughing and sneezing  Do you have problems with loss of bowel control? N  Managing your Medications? N  Managing your Finances? N  Housekeeping or managing your Housekeeping? N  Some recent data might be hidden     Patient Care Team: [name], FNP as PCP - General (General Practice) Excell Seltzer, MD as Consulting Physician (General Surgery) Nicholas Lose, MD as Consulting Physician (Hematology and Oncology) Gery Pray, MD as Consulting Physician (Radiation Oncology)    Assessment:   This is a routine wellness examination for Mita.  Exercise Activities and Dietary recommendations Current Exercise Habits: Home exercise routine, Type of exercise: strength training/weights, Time (Minutes): 30, Frequency (Times/Week): 7, Weekly Exercise (Minutes/Week): 210  Goals    . DIET - REDUCE SUGAR INTAKE (pt-stated)     Patient would like to cut back on snacks.    . Exercise 150 min/wk Moderate  Activity     06/19/2019, wants to start an exercise room       Fall Risk Fall Risk  06/19/2019 07/16/2018 05/23/2018 04/19/2018  Falls in the past year? 0 0 No No  Number falls in past yr: 0 0 - -  Injury with Fall? - 0 - -  Risk for fall due to : Medication side effect - Medication side effect -  Follow up Falls evaluation completed;Education provided;Falls prevention discussed - - -   Is the patient's home free of loose throw rugs in walkways, pet beds, electrical cords, etc?   yes      Grab bars in the bathroom? yes      Handrails on the stairs?   yes      Adequate lighting?   yes  Timed Get Up and Go performed: n/a  Depression Screen PHQ 2/9 Scores 06/19/2019 07/16/2018 05/23/2018 04/19/2018  PHQ - 2 Score 0 0 0 0  PHQ- 9 Score 0 - - -     Cognitive Function     6CIT Screen 06/19/2019 05/23/2018  What Year? 0 points 0 points  What month? 0 points 0 points  What time? 0 points 0 points  Count back from 20 0 points 0 points  Months in reverse 2 points 2 points  Repeat phrase 6 points 0 points  Total Score 8 2     There is no immunization history on file for this patient.  Qualifies for Shingles Vaccine? yes  Screening Tests Health Maintenance  Topic Date Due  . PNA vac Low  Risk Adult (1 of 2 - PCV13) 09/02/2002  . INFLUENZA VACCINE  11/20/2019 (Originally 03/23/2019)  . TETANUS/TDAP  05/16/2023  . DEXA SCAN  Completed    Cancer Screenings: Lung: Low Dose CT Chest recommended if Age 32-80 years, 30 pack-year currently smoking OR have quit w/in 15years. Patient does not qualify. Breast:  Up to date on Mammogram? Yes   Up to date of Bone Density/Dexa? Yes Colorectal: up to date  Additional Screenings: : Hepatitis C Screening: n/a     Plan:    Patient wants to start an exercise room in her home.   I have personally reviewed and noted the following in the patient's chart:   . Medical and social history . Use of alcohol, tobacco or illicit drugs  . Current medications and supplements . Functional ability and status . Nutritional status . Physical activity . Advanced directives . List of other physicians . Hospitalizations, surgeries, and ER visits in previous 12 months . Vitals . Screenings to include cognitive, depression, and falls . Referrals and appointments  In addition, I have reviewed and discussed with patient certain preventive protocols, quality metrics, and best practice recommendations. A written personalized care plan for preventive services as well as general preventive health recommendations were provided to patient.     Kellie Simmering, LPN  [id number]

## 2019-06-25 ENCOUNTER — Encounter: Payer: Self-pay | Admitting: Nurse Practitioner

## 2019-07-26 ENCOUNTER — Other Ambulatory Visit: Payer: Self-pay | Admitting: Internal Medicine

## 2019-08-22 ENCOUNTER — Other Ambulatory Visit: Payer: Self-pay | Admitting: Hematology and Oncology

## 2019-09-12 ENCOUNTER — Encounter: Payer: Self-pay | Admitting: Nurse Practitioner

## 2019-09-12 ENCOUNTER — Other Ambulatory Visit: Payer: Self-pay

## 2019-09-12 ENCOUNTER — Ambulatory Visit: Payer: Medicare PPO | Admitting: Nurse Practitioner

## 2019-09-12 VITALS — BP 138/86 | HR 67 | Temp 98.4°F | Ht 64.8 in | Wt 168.2 lb

## 2019-09-12 DIAGNOSIS — R300 Dysuria: Secondary | ICD-10-CM

## 2019-09-12 DIAGNOSIS — C50212 Malignant neoplasm of upper-inner quadrant of left female breast: Secondary | ICD-10-CM

## 2019-09-12 DIAGNOSIS — R350 Frequency of micturition: Secondary | ICD-10-CM

## 2019-09-12 DIAGNOSIS — M19041 Primary osteoarthritis, right hand: Secondary | ICD-10-CM

## 2019-09-12 DIAGNOSIS — R202 Paresthesia of skin: Secondary | ICD-10-CM

## 2019-09-12 DIAGNOSIS — Z170 Estrogen receptor positive status [ER+]: Secondary | ICD-10-CM

## 2019-09-12 DIAGNOSIS — M19042 Primary osteoarthritis, left hand: Secondary | ICD-10-CM

## 2019-09-12 DIAGNOSIS — R200 Anesthesia of skin: Secondary | ICD-10-CM

## 2019-09-12 DIAGNOSIS — K219 Gastro-esophageal reflux disease without esophagitis: Secondary | ICD-10-CM

## 2019-09-12 DIAGNOSIS — M7989 Other specified soft tissue disorders: Secondary | ICD-10-CM

## 2019-09-12 DIAGNOSIS — R7309 Other abnormal glucose: Secondary | ICD-10-CM | POA: Diagnosis not present

## 2019-09-12 LAB — POCT URINALYSIS DIPSTICK
Bilirubin, UA: NEGATIVE
Blood, UA: NEGATIVE
Glucose, UA: NEGATIVE
Ketones, UA: NEGATIVE
Nitrite, UA: NEGATIVE
Protein, UA: NEGATIVE
Spec Grav, UA: 1.02 (ref 1.010–1.025)
Urobilinogen, UA: 0.2 E.U./dL
pH, UA: 6.5 (ref 5.0–8.0)

## 2019-09-12 MED ORDER — OMEPRAZOLE 20 MG PO CPDR: 20.0000 mg | DELAYED_RELEASE_CAPSULE | Freq: Every day | ORAL | 1 refills | Status: DC

## 2019-09-12 MED ORDER — NITROFURANTOIN MONOHYD MACRO 100 MG PO CAPS: 100.0000 mg | ORAL_CAPSULE | Freq: Two times a day (BID) | ORAL | 0 refills | Status: AC

## 2019-09-12 NOTE — Progress Notes (Signed)
This visit occurred during the SARS-CoV-2 public health emergency.  Safety protocols were in place, including screening questions prior to the visit, additional usage of staff PPE, and extensive cleaning of exam room while observing appropriate contact time as indicated for disinfecting solutions.  Subjective:     Patient ID: Dawn Rodriguez , female    DOB: August 08, 1936 , 84 y.o.   MRN: [id number]   Chief Complaint  Patient presents with  . Dysuria  . toe concerns    HPI  Bilateral feet swelling, denies shortness of breath.  Sometimes will have discomfort.  She is taking calcium and vitamin d.  She is having side effects from the letrozole - headaches, night sweats.  Her next appt is not set up with oncology  Dysuria  This is a recurrent problem. The current episode started 1 to 4 weeks ago. She is not sexually active. There is no history of pyelonephritis. Associated symptoms include frequency. Pertinent negatives include no chills, discharge or nausea.     Past Medical History:  Diagnosis Date  . Arthritis   . Bilateral dry eyes   . Cancer ([place]) 02/2018   left breast cancer  . Cataracts, bilateral   . Chronic headache   . GERD (gastroesophageal reflux disease)   . Hypertension   . Personal history of radiation therapy   . TMJ syndrome      Family History  Problem Relation Age of Onset  . Pneumonia Mother   . Breast cancer Cousin      Current Outpatient Medications:  .  alendronate (FOSAMAX) 70 MG tablet, Take 1 tablet (70 mg total) by mouth once a week. Take with a full glass of water on an empty stomach. (Patient not taking: Reported on 06/19/2019), Disp: 12 tablet, Rfl: 3 .  amLODipine (NORVASC) 2.5 MG tablet, TAKE 1 TABLET EVERY DAY, Disp: 90 tablet, Rfl: 1 .  Calcium Carb-Cholecalciferol (CALCIUM-VITAMIN D3) 600-400 MG-UNIT TABS, Take by mouth 2 (two) times daily., Disp: , Rfl:  .  calcium carbonate (TUMS - DOSED IN MG ELEMENTAL CALCIUM) 500 MG chewable  tablet, Chew 1 tablet by mouth daily as needed for indigestion or heartburn., Disp: , Rfl:  .  Garlic 123XX123 MG CAPS, Take 1,000 mg by mouth daily. , Disp: , Rfl:  .  Glycerin-Polysorbate 80 (REFRESH DRY EYE THERAPY OP), Place 1-2 drops into both eyes 4 (four) times daily as needed (for dry eyes). , Disp: , Rfl:  .  ipratropium (ATROVENT) 0.06 % nasal spray, Place 2 sprays into both nostrils 4 (four) times daily., Disp: 15 mL, Rfl: 1 .  KRILL OIL OMEGA-3 PO, Take 1 capsule by mouth daily., Disp: , Rfl:  .  letrozole (FEMARA) 2.5 MG tablet, TAKE 1 TABLET BY MOUTH EVERY DAY, Disp: 90 tablet, Rfl: 0 .  Magnesium 250 MG TABS, Take 250 mg by mouth daily. , Disp: , Rfl:  .  omeprazole (PRILOSEC) 20 MG capsule, Take 1 capsule (20 mg total) by mouth daily., Disp: 90 capsule, Rfl: 0 .  potassium gluconate 595 MG TABS, Take 595 mg by mouth daily., Disp: , Rfl:  .  vitamin B-12 (CYANOCOBALAMIN) 1000 MCG tablet, Take 1,000 mcg by mouth daily., Disp: , Rfl:    Allergies  Allergen Reactions  . Tobramycin Itching, Swelling and Rash    Tobramycin eye drops caused swelling, itching, drainage, rash and peeling of the skin     Review of Systems  Constitutional: Negative.  Negative for chills and fatigue.  Respiratory: Negative.  Cardiovascular: Negative.  Negative for chest pain, palpitations and leg swelling.  Gastrointestinal: Negative for nausea.  Endocrine: Negative for polydipsia, polyphagia and polyuria.  Genitourinary: Positive for dysuria and frequency.  Musculoskeletal:       Right ankle intermittent swelling after falling in December.    Neurological: Negative for dizziness and headaches.  Psychiatric/Behavioral: Negative.      There were no vitals filed for this visit. There is no height or weight on file to calculate BMI.   Objective:  Physical Exam Constitutional:      General: She is not in acute distress.    Appearance: Normal appearance.  Cardiovascular:     Rate and Rhythm: Normal  rate and regular rhythm.     Pulses: Normal pulses.     Heart sounds: Normal heart sounds. No murmur.  Pulmonary:     Effort: Pulmonary effort is normal. No respiratory distress.     Breath sounds: Normal breath sounds.  Abdominal:     General: Bowel sounds are normal. There is no distension.     Palpations: Abdomen is soft.     Tenderness: There is no right CVA tenderness or left CVA tenderness.  Musculoskeletal:        General: Swelling (bilateral lower extremities) present.     Comments: Left side positive phalen  And limited range of motion to left thumb. Positive tinel's bilateral  Skin:    Capillary Refill: Capillary refill takes less than 2 seconds.  Neurological:     General: No focal deficit present.     Mental Status: She is alert and oriented to person, place, and time.     Cranial Nerves: No cranial nerve deficit.  Psychiatric:        Mood and Affect: Mood normal.        Behavior: Behavior normal.        Thought Content: Thought content normal.        Judgment: Judgment normal.         Assessment And Plan:    1. Dysuria Urinalysis is normal, but due to her symptoms will treat with an antibiotic and send a urine culture encouraged to drink increased amount of fluids  2. Malignant neoplasm of upper-inner quadrant of left breast in female, estrogen receptor positive ([place]) Continue follow up with Oncology and continue Letrezole  3. Leg swelling Trace to 1+ lower extremity edema  Advised to wear support socks during the day and elevate when possible.   4. Osteoarthritis of both hands, unspecified osteoarthritis type She has persistent pain to her hands Will check autoimmune panel Will refer to hand specialist - Autoimmune Profile - Ambulatory referral to Hand Surgery  5. Numbness and tingling in right hand  Positive phalen's sign will refer to hand surgery   She may need an EMG/NCV - Ambulatory referral to Hand Surgery    [name], FNP    THE  PATIENT IS ENCOURAGED TO PRACTICE SOCIAL DISTANCING DUE TO THE COVID-19 PANDEMIC.

## 2019-09-13 LAB — AUTOIMMUNE PROFILE
Anti Nuclear Antibody (ANA): POSITIVE — AB
Complement C3, Serum: 121 mg/dL (ref 82–167)
dsDNA Ab: 3 IU/mL (ref 0–9)

## 2019-09-13 LAB — HEMOGLOBIN A1C
Est. average glucose Bld gHb Est-mCnc: 120 mg/dL
Hgb A1c MFr Bld: 5.8 % — ABNORMAL HIGH (ref 4.8–5.6)

## 2019-09-14 LAB — URINE CULTURE

## 2019-09-19 ENCOUNTER — Telehealth: Payer: Self-pay | Admitting: Hematology and Oncology

## 2019-09-19 NOTE — Telephone Encounter (Signed)
Returned patient's phone call regarding scheduling an appointment, left a voicemail. 

## 2019-11-16 ENCOUNTER — Other Ambulatory Visit: Payer: Self-pay | Admitting: Hematology and Oncology

## 2019-12-02 ENCOUNTER — Telehealth: Payer: Self-pay | Admitting: Hematology and Oncology

## 2019-12-02 NOTE — Telephone Encounter (Signed)
Called pt per 4/12 sch message - no answer - left message for pt to call back to set up appt

## 2019-12-03 ENCOUNTER — Telehealth: Payer: Self-pay

## 2019-12-03 NOTE — Telephone Encounter (Signed)
RN attempted call back, left voicemail for return call.

## 2019-12-10 ENCOUNTER — Inpatient Hospital Stay: Payer: Medicare PPO | Admitting: Hematology and Oncology

## 2019-12-11 ENCOUNTER — Telehealth: Payer: Self-pay | Admitting: Hematology and Oncology

## 2019-12-11 NOTE — Telephone Encounter (Signed)
R/s 4/20 appt per sch msg and patient request. Called and left msg. Mailed printout of new appt date and time

## 2019-12-14 ENCOUNTER — Ambulatory Visit (INDEPENDENT_AMBULATORY_CARE_PROVIDER_SITE_OTHER)
Admission: EM | Admit: 2019-12-14 | Discharge: 2019-12-14 | Disposition: A | Discharge status: Home / Self Care | Payer: Medicare PPO | Source: Home / Self Care

## 2019-12-14 ENCOUNTER — Encounter (HOSPITAL_COMMUNITY): Payer: Self-pay | Admitting: Emergency Medicine

## 2019-12-14 ENCOUNTER — Encounter (HOSPITAL_COMMUNITY): Payer: Self-pay

## 2019-12-14 ENCOUNTER — Emergency Department (HOSPITAL_COMMUNITY): Payer: Medicare PPO

## 2019-12-14 ENCOUNTER — Emergency Department (HOSPITAL_COMMUNITY)
Admission: EM | Admit: 2019-12-14 | Discharge: 2019-12-14 | Disposition: A | Discharge status: Home / Self Care | Payer: Medicare PPO | Attending: Emergency Medicine | Admitting: Emergency Medicine

## 2019-12-14 ENCOUNTER — Other Ambulatory Visit: Payer: Self-pay

## 2019-12-14 DIAGNOSIS — I10 Essential (primary) hypertension: Secondary | ICD-10-CM | POA: Insufficient documentation

## 2019-12-14 DIAGNOSIS — R519 Headache, unspecified: Secondary | ICD-10-CM | POA: Diagnosis not present

## 2019-12-14 DIAGNOSIS — Z79899 Other long term (current) drug therapy: Secondary | ICD-10-CM | POA: Insufficient documentation

## 2019-12-14 NOTE — ED Triage Notes (Signed)
Pt is here with a headache that started yesterday, she got 2nd COVID vaccine on Monday. Pt has taken to relieve discomfort.

## 2019-12-14 NOTE — ED Triage Notes (Signed)
C/o headache since last night.  Received 2nd COVID vaccine on Monday.  No neuro deficits.

## 2019-12-14 NOTE — Discharge Instructions (Addendum)
Your headache requires further evaluation in the Emergency Department

## 2019-12-14 NOTE — ED Provider Notes (Signed)
[place]    CSN: [id number] Arrival date & time: 12/14/19  1040      History   Chief Complaint Chief Complaint  Patient presents with  . Headache    HPI Dawn Rodriguez is a 84 y.o. female.   Patient without history of frequent headaches Reports urgent care for evaluation of headache.  She reports last night when she was going to bed she had a sudden onset of severe headache.  She reports that it was seconds to minutes to full severity of this headache.  Described as "20 out of 10". she describes the headache as being all around her head.  She also endorses throbbing sensations that she describes as" popping" and" vibrating".  While she is describing this she is pointing to several locations on her head.  She reports this headache was severe and the sensations lasted for an hour to an hour and a half.  She reports laying down and going to sleep.  She denies any increase with light, nausea or vomiting, weakness, tingling or change in speech.  She did not have any traumas or falls.  She reports she woke this morning with continued headache and points to the right side of her head.  She reports it is diminished some and describes it as a 4-6 out of 10.  She continues to deny nausea or vomiting or photophobia.  She reports she drove to the clinic today.  She is concerned whether this could be a reaction to the [place] Covid vaccine.  She received the second dose on Monday which she was 5 days ago.  She reports some soreness over the first few days however no headache.  She also takes medicines for her cancer treatments that can cause headaches however is never had a headache like this in her life.  No fevers and chills or other accompanying symptoms.     Past Medical History:  Diagnosis Date  . Arthritis   . Bilateral dry eyes   . Cancer ([place]) 02/2018   left breast cancer  . Cataracts, bilateral   . Chronic headache   . GERD (gastroesophageal reflux disease)   .  Hypertension   . Personal history of radiation therapy   . TMJ syndrome     Patient Active Problem List   Diagnosis Date Noted  . Encounter for Papanicolaou smear of cervix 11/12/2018  . Vaginal discharge 11/12/2018  . UTI (urinary tract infection) 05/09/2018  . Malignant neoplasm of upper-inner quadrant of left breast in female, estrogen receptor positive ([place]) 03/20/2018  . Cataracts, bilateral   . GERD (gastroesophageal reflux disease)   . OSTEOARTHRITIS 10/04/2007  . INTERNAL HEMORRHOIDS 09/14/2007  . DIVERTICULOSIS, SIGMOID COLON 09/14/2007    Past Surgical History:  Procedure Laterality Date  . BREAST BIOPSY Right 2012   stereotatic biopsy  . BREAST LUMPECTOMY Left 04/06/2018   Procedure: LEFT BREAST LUMPECTOMY;  Surgeon: Excell Seltzer, MD;  Location: [facility];  Service: General;  Laterality: Left;  . CATARACT EXTRACTION      OB History   No obstetric history on file.      Home Medications    Prior to Admission medications   Medication Sig Start Date End Date Taking? Authorizing Provider  alendronate (FOSAMAX) 70 MG tablet Take 1 tablet (70 mg total) by mouth once a week. Take with a full glass of water on an empty stomach. Patient not taking: Reported on 06/19/2019 07/16/18   Nicholas Lose, MD  amLODipine ([place])  2.5 MG tablet TAKE 1 TABLET EVERY DAY 07/26/19   [name], FNP  Calcium Carb-Cholecalciferol (CALCIUM-VITAMIN D3) 600-400 MG-UNIT TABS Take by mouth 2 (two) times daily.    [provider]  calcium carbonate (TUMS - DOSED IN MG ELEMENTAL CALCIUM) 500 MG chewable tablet Chew 1 tablet by mouth daily as needed for indigestion or heartburn.    [provider]  Garlic 123XX123 MG CAPS Take 1,000 mg by mouth daily.     [provider]  Glycerin-Polysorbate 80 (REFRESH DRY EYE THERAPY OP) Place 1-2 drops into both eyes 4 (four) times daily as needed (for dry eyes).     [provider]  ipratropium  (ATROVENT) 0.06 % nasal spray Place 2 sprays into both nostrils 4 (four) times daily. 08/06/16   Billy Fischer, MD  KRILL OIL OMEGA-3 PO Take 1 capsule by mouth daily.    [provider]  letrozole (FEMARA) 2.5 MG tablet TAKE 1 TABLET BY MOUTH EVERY DAY 11/18/19   Nicholas Lose, MD  Magnesium 250 MG TABS Take 250 mg by mouth daily.     [provider]  omeprazole (PRILOSEC) 20 MG capsule Take 1 capsule (20 mg total) by mouth daily. 09/12/19 12/11/19  [name], FNP  potassium gluconate 595 MG TABS Take 595 mg by mouth daily.    [provider]  vitamin B-12 (CYANOCOBALAMIN) 1000 MCG tablet Take 1,000 mcg by mouth daily.    [provider]    Family History Family History  Problem Relation Age of Onset  . Pneumonia Mother   . Breast cancer Cousin     Social History Social History   Tobacco Use  . Smoking status: Never Smoker  . Smokeless tobacco: Never Used  Substance Use Topics  . Alcohol use: No  . Drug use: No     Allergies   Tobramycin   Review of Systems Review of Systems  Per HPI Physical Exam Triage Vital Signs ED Triage Vitals  Enc Vitals Group     BP 12/14/19 1057 (!) 153/78     Pulse Rate 12/14/19 1057 65     Resp 12/14/19 1057 17     Temp 12/14/19 1057 98.4 F (36.9 C)     Temp Source 12/14/19 1057 Oral     SpO2 12/14/19 1057 100 %     Weight --      Height --      Head Circumference --      Peak Flow --      Pain Score 12/14/19 1059 5     Pain Loc --      Pain Edu? --      Excl. in [place]? --    No data found.  Updated Vital Signs BP (!) 153/78 (BP Location: Right Arm)   Pulse 65   Temp 98.4 F (36.9 C) (Oral)   Resp 17   SpO2 100%   Visual Acuity Right Eye Distance:   Left Eye Distance:   Bilateral Distance:    Right Eye Near:   Left Eye Near:    Bilateral Near:     Physical Exam Vitals and nursing note reviewed.  Constitutional:      General: She is not in acute distress.    Appearance: She  is well-developed. She is not ill-appearing.  HENT:     Head: Normocephalic and atraumatic.     Mouth/Throat:     Mouth: Mucous membranes are moist.     Pharynx: Oropharynx is clear.  Eyes:     General: No visual field deficit.    Extraocular Movements: Extraocular movements intact.     Conjunctiva/sclera: Conjunctivae normal.     Pupils: Pupils are equal, round, and reactive to light.  Cardiovascular:     Rate and Rhythm: Normal rate and regular rhythm.     Heart sounds: No murmur.  Pulmonary:     Effort: Pulmonary effort is normal. No respiratory distress.     Breath sounds: Normal breath sounds.  Abdominal:     Palpations: Abdomen is soft.     Tenderness: There is no abdominal tenderness.  Musculoskeletal:     Cervical back: Normal range of motion and neck supple. No rigidity.  Lymphadenopathy:     Cervical: No cervical adenopathy.  Skin:    General: Skin is warm and dry.     Findings: No rash.  Neurological:     Mental Status: She is alert and oriented to person, place, and time.     GCS: GCS eye subscore is 4. GCS verbal subscore is 5. GCS motor subscore is 6.     Cranial Nerves: No cranial nerve deficit, dysarthria or facial asymmetry.     Motor: No weakness.     Coordination: Coordination normal.     Gait: Gait normal.      UC Treatments / Results  Labs (all labs ordered are listed, but only abnormal results are displayed) Labs Reviewed - No data to display  EKG   Radiology No results found.  Procedures Procedures (including critical care time)  Medications Ordered in UC Medications - No data to display  Initial Impression / Assessment and Plan / UC Course  I have reviewed the triage vital signs and the nursing notes.  Pertinent labs & imaging results that were available during my care of the patient were reviewed by me and considered in my medical decision making (see chart for details).     #Bad headache Patient is a 83 year old female with no  history of severe headache or migraine presenting for evaluation of severe headache.  Given no history of headache and sudden severe onset with quick escalation to severity I have concern for intracranial pathology.  She is neurologically well and was ambulatory in clinic and had self-reported I do feel she can self report to the emergency department.  I discussed that she needs further evaluation emergency department to ensure that this is nothing life-threatening prior to considering other causes.  She agrees that she will report to the [facility] emergency department. -Differential would include possible subarachnoid or other intracranial bleed. Final Clinical Impressions(s) / UC Diagnoses   Final diagnoses:  Bad headache     Discharge Instructions     Your headache requires further evaluation in the Emergency Department    ED Prescriptions    None     PDMP not reviewed this encounter.   Purnell Shoemaker, PA-C 12/14/19 1212

## 2019-12-14 NOTE — Discharge Instructions (Signed)
Please read and follow all provided instructions.  Your diagnoses today include:  1. Acute nonintractable headache, unspecified headache type     Tests performed today include:  CT of your head which was normal and did not show any serious cause of your headache  Vital signs. See below for your results today.   Medications:  Take any prescribed medications only as directed.  Additional information:  Follow any educational materials contained in this packet.  You are having a headache. No specific cause was found today for your headache. It may have been a migraine or other cause of headache. Stress, anxiety, fatigue, and depression are common triggers for headaches.   Your headache today does not appear to be life-threatening or require hospitalization, but often the exact cause of headaches is not determined in the emergency department. Therefore, follow-up with your doctor is very important to find out what may have caused your headache and whether or not you need any further diagnostic testing or treatment.   Sometimes headaches can appear benign (not harmful), but then more serious symptoms can develop which should prompt an immediate re-evaluation by your doctor or the emergency department.  BE VERY CAREFUL not to take multiple medicines containing Tylenol (also called acetaminophen). Doing so can lead to an overdose which can damage your liver and cause liver failure and possibly death.   Follow-up instructions: Please follow-up with your primary care provider in the next 3 days for further evaluation of your symptoms.   Return instructions:   Please return to the Emergency Department if you experience worsening symptoms.  Return if the medications do not resolve your headache, if it recurs, or if you have multiple episodes of vomiting or cannot keep down fluids.  Return if you have a change from the usual headache.  RETURN IMMEDIATELY IF you:  Develop a sudden, severe  headache  Develop confusion or become poorly responsive or faint  Develop a fever above 100.50F or problem breathing  Have a change in speech, vision, swallowing, or understanding  Develop new weakness, numbness, tingling, incoordination in your arms or legs  Have a seizure  Please return if you have any other emergent concerns.  Additional Information:  Your vital signs today were: BP 124/62 (BP Location: Right Arm)   Pulse (!) 59   Temp 98.2 F (36.8 C) (Oral)   Resp 12   Ht 5\' 6"  (1.676 m)   Wt 71.2 kg   SpO2 100%   BMI 25.34 kg/m  If your blood pressure (BP) was elevated above 135/85 this visit, please have this repeated by your doctor within one month. --------------

## 2019-12-14 NOTE — ED Provider Notes (Signed)
[facility] EMERGENCY DEPARTMENT Provider Note   CSN: [id number] Arrival date & time: 12/14/19  1216     History Chief Complaint  Patient presents with  . Headache    Dawn Rodriguez is a 84 y.o. female.  Patient with history of breast cancer --presents the emergency department complaint of headache/sensations in her head starting earlier this morning.  Patient states that she was up watching TV when she felt a squeezing type sensation in her head and felt like fireworks were going off around the periphery of her head.  She called her son who told her to relax and to rest.  After about an hour and a half she was able to fall asleep when symptoms improved.  She woke up this morning with a mild headache on the right side which resolved with Tylenol.  She did go to urgent care to get checked and was referred to the emergency department for further evaluation.  Patient denies any head injuries.  No confusion, vomiting, vision changes.  Patient denies signs of stroke including: facial droop, slurred speech, aphasia, weakness/numbness in extremities, imbalance/trouble walking.  No anticoagulation.  Patient did receive her second Covid vaccine 5 days ago and was concerned that this caused her symptoms or it was interfering with one of her breast cancer medications.         Past Medical History:  Diagnosis Date  . Arthritis   . Bilateral dry eyes   . Cancer ([place]) 02/2018   left breast cancer  . Cataracts, bilateral   . Chronic headache   . GERD (gastroesophageal reflux disease)   . Hypertension   . Personal history of radiation therapy   . TMJ syndrome     Patient Active Problem List   Diagnosis Date Noted  . Encounter for Papanicolaou smear of cervix 11/12/2018  . Vaginal discharge 11/12/2018  . UTI (urinary tract infection) 05/09/2018  . Malignant neoplasm of upper-inner quadrant of left breast in female, estrogen receptor positive ([place]) 03/20/2018  .  Cataracts, bilateral   . GERD (gastroesophageal reflux disease)   . OSTEOARTHRITIS 10/04/2007  . INTERNAL HEMORRHOIDS 09/14/2007  . DIVERTICULOSIS, SIGMOID COLON 09/14/2007    Past Surgical History:  Procedure Laterality Date  . BREAST BIOPSY Right 2012   stereotatic biopsy  . BREAST LUMPECTOMY Left 04/06/2018   Procedure: LEFT BREAST LUMPECTOMY;  Surgeon: Excell Seltzer, MD;  Location: [facility];  Service: General;  Laterality: Left;  . CATARACT EXTRACTION       OB History   No obstetric history on file.     Family History  Problem Relation Age of Onset  . Pneumonia Mother   . Breast cancer Cousin     Social History   Tobacco Use  . Smoking status: Never Smoker  . Smokeless tobacco: Never Used  Substance Use Topics  . Alcohol use: No  . Drug use: No    Home Medications Prior to Admission medications   Medication Sig Start Date End Date Taking? Authorizing Provider  alendronate (FOSAMAX) 70 MG tablet Take 1 tablet (70 mg total) by mouth once a week. Take with a full glass of water on an empty stomach. Patient not taking: Reported on 06/19/2019 07/16/18   Nicholas Lose, MD  amLODipine ([place]) 2.5 MG tablet TAKE 1 TABLET EVERY DAY 07/26/19   [name], FNP  Calcium Carb-Cholecalciferol (CALCIUM-VITAMIN D3) 600-400 MG-UNIT TABS Take by mouth 2 (two) times daily.    [provider]  calcium carbonate (  TUMS - DOSED IN MG ELEMENTAL CALCIUM) 500 MG chewable tablet Chew 1 tablet by mouth daily as needed for indigestion or heartburn.    [provider]  Garlic 123XX123 MG CAPS Take 1,000 mg by mouth daily.     [provider]  Glycerin-Polysorbate 80 (REFRESH DRY EYE THERAPY OP) Place 1-2 drops into both eyes 4 (four) times daily as needed (for dry eyes).     [provider]  ipratropium (ATROVENT) 0.06 % nasal spray Place 2 sprays into both nostrils 4 (four) times daily. 08/06/16   Billy Fischer, MD  KRILL OIL OMEGA-3 PO  Take 1 capsule by mouth daily.    [provider]  letrozole (FEMARA) 2.5 MG tablet TAKE 1 TABLET BY MOUTH EVERY DAY 11/18/19   Nicholas Lose, MD  Magnesium 250 MG TABS Take 250 mg by mouth daily.     [provider]  omeprazole (PRILOSEC) 20 MG capsule Take 1 capsule (20 mg total) by mouth daily. 09/12/19 12/11/19  [name], FNP  potassium gluconate 595 MG TABS Take 595 mg by mouth daily.    [provider]  vitamin B-12 (CYANOCOBALAMIN) 1000 MCG tablet Take 1,000 mcg by mouth daily.    [provider]    Allergies    Tobramycin  Review of Systems   Review of Systems  Constitutional: Negative for fever.  HENT: Negative for congestion, dental problem, rhinorrhea and sinus pressure.   Eyes: Negative for photophobia, discharge, redness and visual disturbance.  Respiratory: Negative for shortness of breath.   Cardiovascular: Negative for chest pain.  Gastrointestinal: Negative for nausea and vomiting.  Musculoskeletal: Negative for gait problem, neck pain and neck stiffness.  Skin: Negative for rash.  Neurological: Positive for headaches. Negative for syncope, speech difficulty, weakness, light-headedness and numbness.  Psychiatric/Behavioral: Negative for confusion.    Physical Exam Updated Vital Signs BP 124/62 (BP Location: Right Arm)   Pulse (!) 59   Temp 98.2 F (36.8 C) (Oral)   Resp 12   Ht 5\' 6"  (1.676 m)   Wt 71.2 kg   SpO2 100%   BMI 25.34 kg/m   Physical Exam Vitals and nursing note reviewed.  Constitutional:      Appearance: She is well-developed.  HENT:     Head: Normocephalic and atraumatic.     Right Ear: Tympanic membrane, ear canal and external ear normal.     Left Ear: Tympanic membrane, ear canal and external ear normal.     Nose: Nose normal.     Mouth/Throat:     Pharynx: Uvula midline.  Eyes:     General: Lids are normal.        Right eye: No discharge.        Left eye: No discharge.     Extraocular  Movements:     Right eye: No nystagmus.     Left eye: No nystagmus.     Conjunctiva/sclera: Conjunctivae normal.     Pupils: Pupils are equal, round, and reactive to light.  Neck:     Comments: No carotid bruits Cardiovascular:     Rate and Rhythm: Normal rate and regular rhythm.     Heart sounds: Normal heart sounds.  Pulmonary:     Effort: Pulmonary effort is normal.     Breath sounds: Normal breath sounds.  Abdominal:     Palpations: Abdomen is soft.     Tenderness: There is no abdominal tenderness.  Musculoskeletal:     Cervical back: Normal range  of motion and neck supple. No tenderness or bony tenderness.  Skin:    General: Skin is warm and dry.  Neurological:     Mental Status: She is alert and oriented to person, place, and time.     GCS: GCS eye subscore is 4. GCS verbal subscore is 5. GCS motor subscore is 6.     Cranial Nerves: No cranial nerve deficit.     Sensory: No sensory deficit.     Coordination: Coordination normal.     Gait: Gait normal.     Deep Tendon Reflexes: Reflexes are normal and symmetric.     ED Results / Procedures / Treatments   Labs (all labs ordered are listed, but only abnormal results are displayed) Labs Reviewed - No data to display  EKG None  Radiology CT Head Wo Contrast  Result Date: 12/14/2019 CLINICAL DATA:  84 year old female with acute headache. EXAM: CT HEAD WITHOUT CONTRAST TECHNIQUE: Contiguous axial images were obtained from the base of the skull through the vertex without intravenous contrast. COMPARISON:  Head CT dated 04/10/2018. FINDINGS: Brain: The ventricles and sulci appropriate size for patient's age. The gray-white matter discrimination is preserved. There is no acute intracranial hemorrhage. No mass effect or midline shift no extra-axial fluid collection. Vascular: No hyperdense vessel or unexpected calcification. Skull: Normal. Negative for fracture or focal lesion. Sinuses/Orbits: No acute finding. Other: None  IMPRESSION: Unremarkable noncontrast CT of the brain. Electronically Signed   By: Anner Crete M.D.   On: 12/14/2019 20:13    Procedures Procedures (including critical care time)  Medications Ordered in ED Medications - No data to display  ED Course  I have reviewed the triage vital signs and the nursing notes.  Pertinent labs & imaging results that were available during my care of the patient were reviewed by me and considered in my medical decision making (see chart for details).  Clinical Course as of Dec 13 2021  Sat Dec 14, 2019  2021 Well-appearing 84 year old female here with atypical headache started last night couple of days after she received Covid vaccine.  Her symptoms of resolved but she is afraid that they might come back again.  Neuro intact.  Head CT unremarkable.  Recommended she follow-up with her doctor and return if any worsening symptoms   [MB]    Clinical Course User Index [MB] Hayden Rasmussen, MD   Patient seen and examined.  Reviewed urgent care note.  Patient is comfortable and asymptomatic at the current time.  I reviewed her head CT which was read as unremarkable.  She has a completely normal neurologic exam at this point.  She is walking without any difficulty in the room.  Plan for discharged home.  She states that she is due to follow-up with her primary care doctor in 2 days and is encouraged to talk about her symptoms at that time.  Vital signs reviewed and are as follows: BP 124/62 (BP Location: Right Arm)   Pulse (!) 59   Temp 98.2 F (36.8 C) (Oral)   Resp 12   Ht 5\' 6"  (1.676 m)   Wt 71.2 kg   SpO2 100%   BMI 25.34 kg/m       MDM Rules/Calculators/A&P                      Patient with head sensations this morning and HA. Now asymptomatic. Patient without high-risk features of headache including: sudden onset/thunderclap HA, no similar headache in past,  altered mental status, accompanying seizure, headache with exertion, history of  immunocompromise, neck or shoulder pain, fever, use of anticoagulation, family history of spontaneous SAH, concomitant drug use, toxic exposure.   Patient has a normal complete neurological exam, normal vital signs, normal level of consciousness, no signs of meningismus, is well-appearing/non-toxic appearing, no signs of trauma, no pain over the temporal arteries.   CT unremarkable. Pt at baseline and looks well.   No dangerous or life-threatening conditions suspected or identified by history, physical exam, and by work-up. No indications for hospitalization identified.   Final Clinical Impression(s) / ED Diagnoses Final diagnoses:  Acute nonintractable headache, unspecified headache type    Rx / DC Orders ED Discharge Orders    None       Carlisle Cater, Hershal Coria 12/14/19 2025    Hayden Rasmussen, MD 12/14/19 2230

## 2019-12-16 ENCOUNTER — Ambulatory Visit: Payer: Medicare PPO | Admitting: Hematology and Oncology

## 2019-12-17 IMAGING — MG DIGITAL SCREENING BILATERAL MAMMOGRAM WITH TOMO AND CAD
8 series · 9 of 24 positions shown · non-contrast
Comparison: Previous exam(s).

CLINICAL DATA: Screening.

EXAM:
DIGITAL SCREENING BILATERAL MAMMOGRAM WITH TOMO AND CAD

[R CC synth-2D]
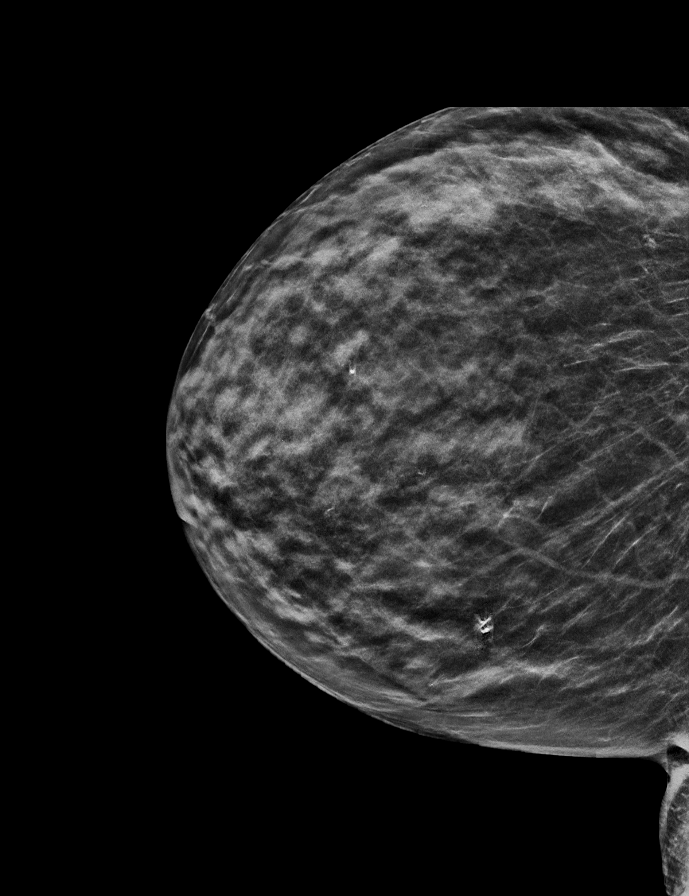

[R MLO synth-2D]
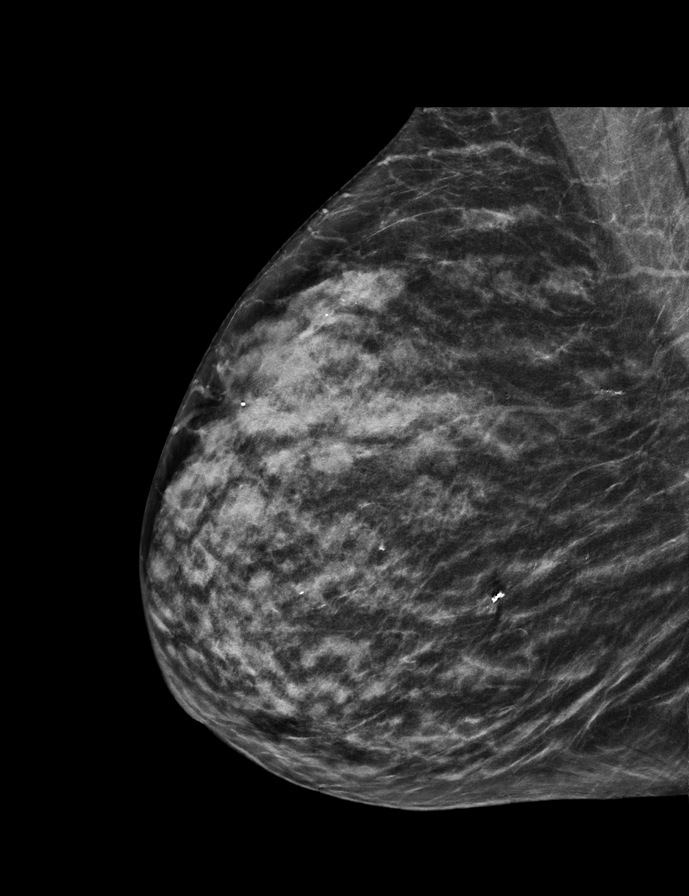

[L MLO synth-2D]
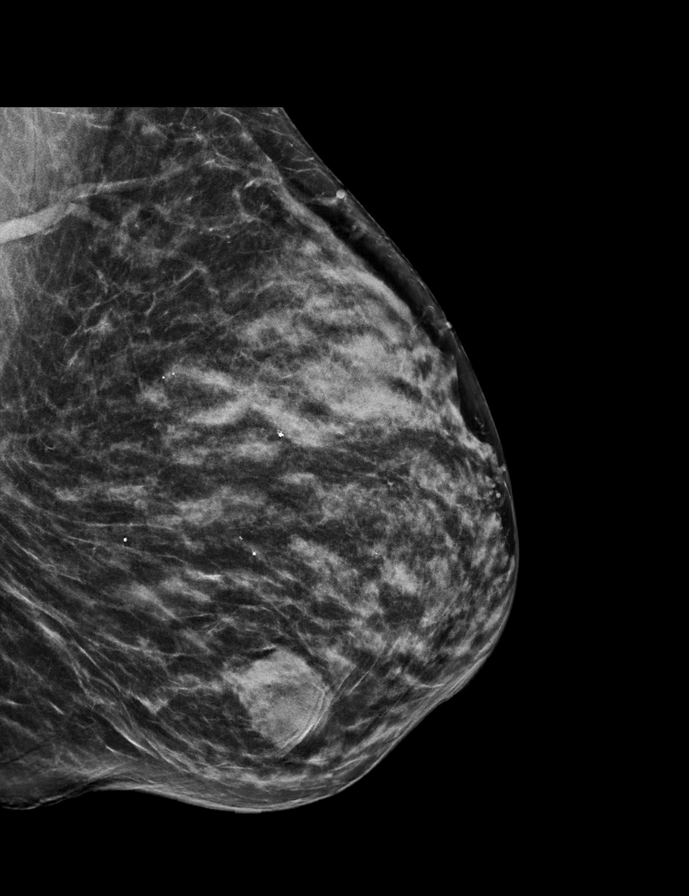

[L CC synth-2D]
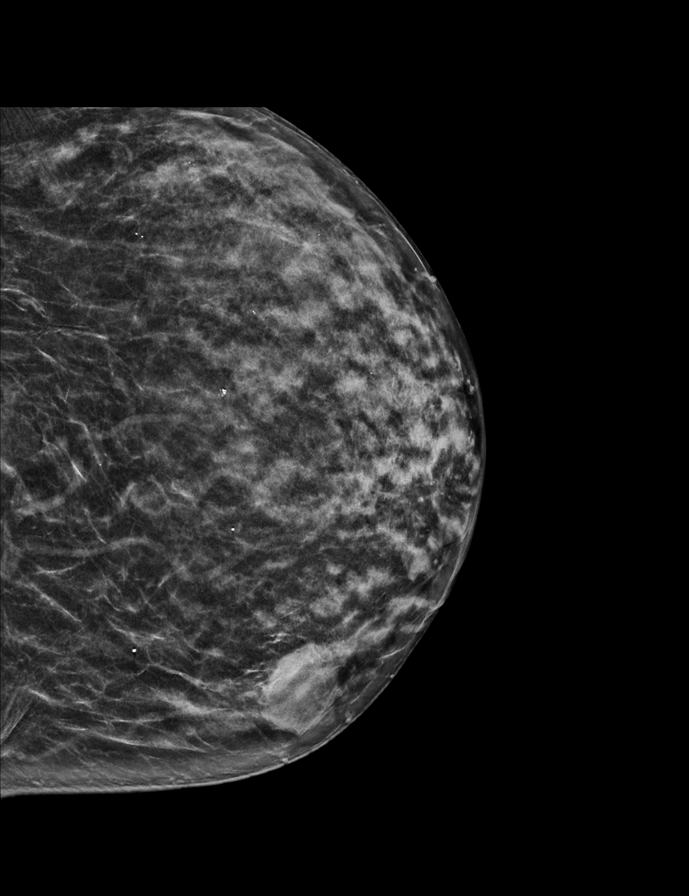

[L MLO tomo · 2 of 54 frames shown]
[frame 18/54]
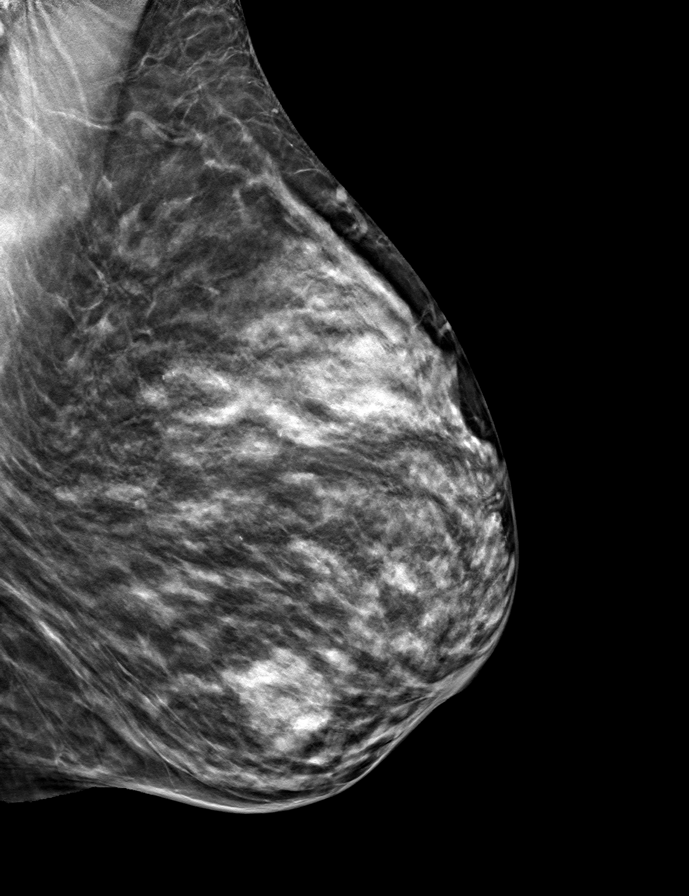
[frame 27/54]
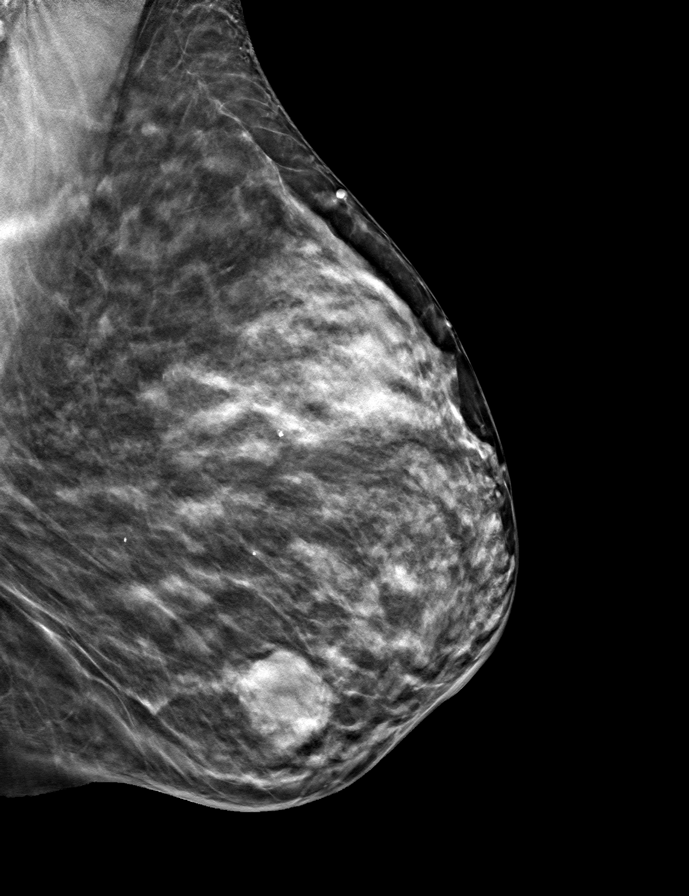

[R MLO tomo · tomo slice 29/57.0]
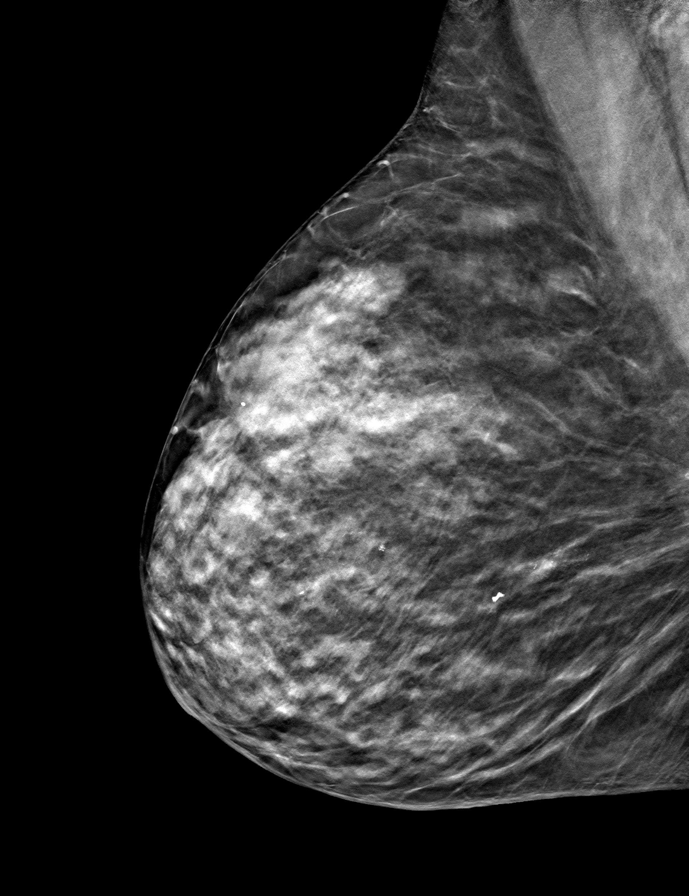

[R CC tomo · tomo slice 29/57.0]
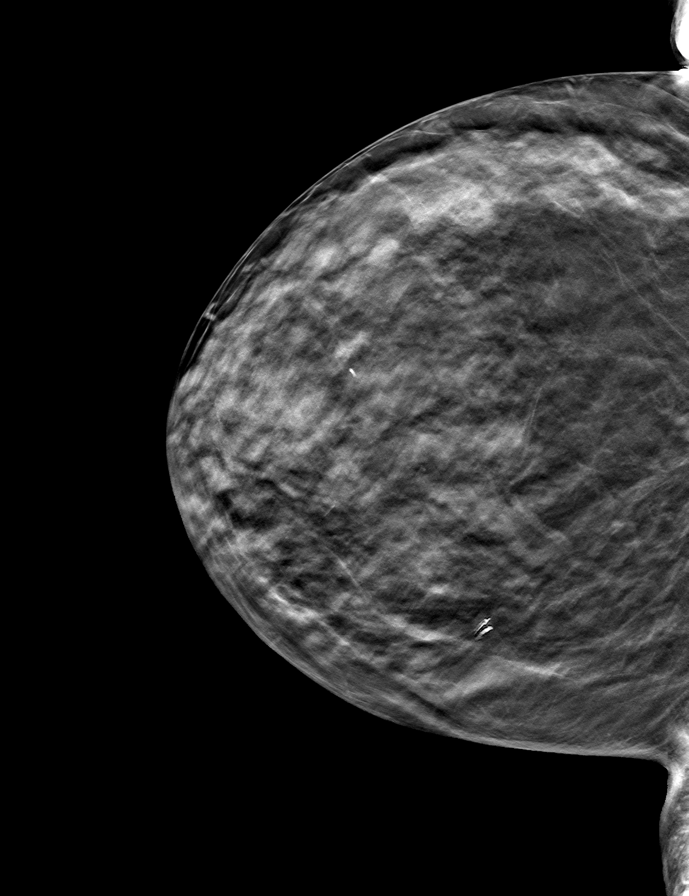

[L CC tomo · tomo slice 27/52.0]
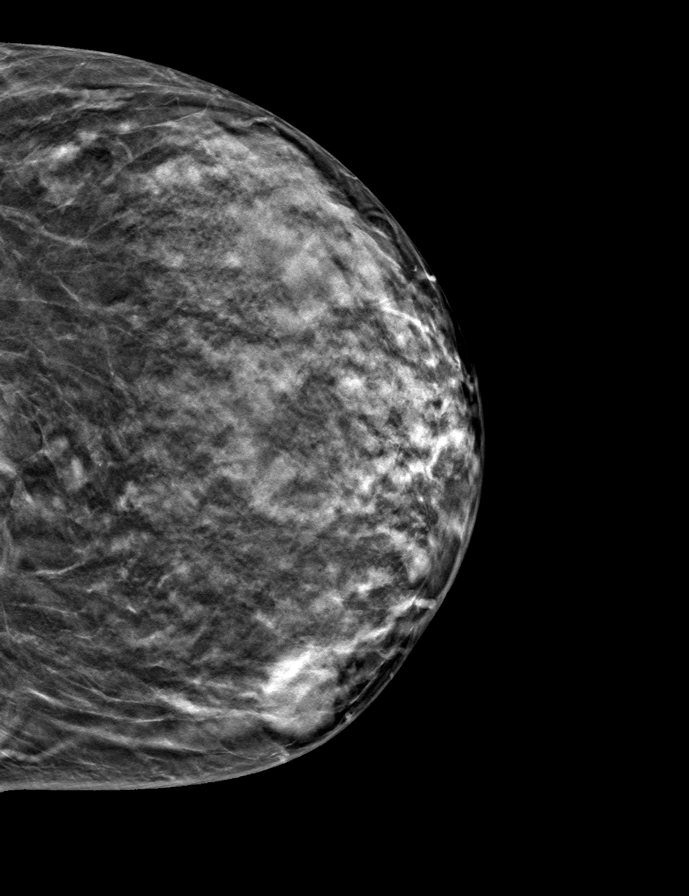

[9 of 24 positions shown; findings below may reference images not displayed]

ACR Breast Density Category c: The breast tissue is heterogeneously
dense, which may obscure small masses.
FINDINGS: In the left breast, a possible mass warrants further evaluation. In
the right breast, no findings suspicious for malignancy.

Images were processed with CAD.
IMPRESSION: Further evaluation is suggested for possible mass in the left
breast.

RECOMMENDATION:
Diagnostic mammogram and possibly ultrasound of the left breast.
(Code:IJ-8-HHX)

The patient will be contacted regarding the findings, and additional
imaging will be scheduled.

BI-RADS CATEGORY  0: Incomplete. Need additional imaging evaluation
and/or prior mammograms for comparison.

## 2019-12-18 ENCOUNTER — Ambulatory Visit: Payer: Medicare PPO | Admitting: Nurse Practitioner

## 2019-12-23 ENCOUNTER — Ambulatory Visit: Payer: Medicare PPO | Admitting: Nurse Practitioner

## 2019-12-23 ENCOUNTER — Other Ambulatory Visit: Payer: Self-pay

## 2019-12-23 ENCOUNTER — Encounter: Payer: Self-pay | Admitting: Nurse Practitioner

## 2019-12-23 VITALS — BP 118/80 | HR 90 | Temp 98.3°F | Ht 60.2 in | Wt 161.2 lb

## 2019-12-23 DIAGNOSIS — R350 Frequency of micturition: Secondary | ICD-10-CM

## 2019-12-23 DIAGNOSIS — R82998 Other abnormal findings in urine: Secondary | ICD-10-CM | POA: Diagnosis not present

## 2019-12-23 DIAGNOSIS — N898 Other specified noninflammatory disorders of vagina: Secondary | ICD-10-CM | POA: Diagnosis not present

## 2019-12-23 DIAGNOSIS — I10 Essential (primary) hypertension: Secondary | ICD-10-CM | POA: Diagnosis not present

## 2019-12-23 LAB — POCT URINALYSIS DIPSTICK
Bilirubin, UA: NEGATIVE
Glucose, UA: NEGATIVE
Ketones, UA: NEGATIVE
Nitrite, UA: NEGATIVE
Protein, UA: NEGATIVE
Spec Grav, UA: >=1.03 — AB (ref 1.010–1.025)
Urobilinogen, UA: 0.2 E.U./dL
pH, UA: 5.5 (ref 5.0–8.0)

## 2019-12-23 MED ORDER — IPRATROPIUM BROMIDE 0.06 % NA SOLN: 2.0000 | Freq: Four times a day (QID) | NASAL | 1 refills | Status: DC

## 2019-12-23 MED ORDER — MOMETASONE FUROATE 0.1 % EX CREA: TOPICAL_CREAM | CUTANEOUS | 1 refills | Status: DC

## 2019-12-23 NOTE — Progress Notes (Signed)
This visit occurred during the SARS-CoV-2 public health emergency.  Safety protocols were in place, including screening questions prior to the visit, additional usage of staff PPE, and extensive cleaning of exam room while observing appropriate contact time as indicated for disinfecting solutions.  Subjective:     Patient ID: Dawn Rodriguez , female    DOB: 1935/11/21 , 84 y.o.   MRN: [id number]   Chief Complaint  Patient presents with  . Hypertension  . Vaginal Discharge    HPI  She had her 2nd vaccine on 4/19 and had a headache on 4/20 and went to ER felt like her head was going to explode.  Had CT scan of head which was normal. She had her vaccine on the left arm.  She was fatigued and out of it, tired.   She has an appt with the Oncologist on Wednesday.  She is reporting shooting pains to her right breast since wearing a tight bra.  She changed her bra and has improved but has soreness.   Hypertension This is a chronic problem. The current episode started more than 1 year ago. The problem is unchanged. The problem is controlled. Associated symptoms include headaches. Pertinent negatives include no anxiety, chest pain or palpitations. There are no associated agents to hypertension. Risk factors for coronary artery disease include sedentary lifestyle. Past treatments include calcium channel blockers. There are no compliance problems.  There is no history of angina. There is no history of chronic renal disease.  Vaginal Discharge The patient's primary symptoms include vaginal discharge. Associated symptoms include headaches.     Past Medical History:  Diagnosis Date  . Arthritis   . Bilateral dry eyes   . Cancer ([place]) 02/2018   left breast cancer  . Cataracts, bilateral   . Chronic headache   . GERD (gastroesophageal reflux disease)   . Hypertension   . Personal history of radiation therapy   . TMJ syndrome      Family History  Problem Relation Age of Onset  .  Pneumonia Mother   . Breast cancer Cousin      Current Outpatient Medications:  .  amLODipine (NORVASC) 2.5 MG tablet, TAKE 1 TABLET EVERY DAY, Disp: 90 tablet, Rfl: 1 .  Calcium Carb-Cholecalciferol (CALCIUM-VITAMIN D3) 600-400 MG-UNIT TABS, Take by mouth 2 (two) times daily., Disp: , Rfl:  .  calcium carbonate (TUMS - DOSED IN MG ELEMENTAL CALCIUM) 500 MG chewable tablet, Chew 1 tablet by mouth daily as needed for indigestion or heartburn., Disp: , Rfl:  .  Garlic 123XX123 MG CAPS, Take 1,000 mg by mouth daily. , Disp: , Rfl:  .  Glycerin-Polysorbate 80 (REFRESH DRY EYE THERAPY OP), Place 1-2 drops into both eyes 4 (four) times daily as needed (for dry eyes). , Disp: , Rfl:  .  ipratropium (ATROVENT) 0.06 % nasal spray, Place 2 sprays into both nostrils 4 (four) times daily., Disp: 15 mL, Rfl: 1 .  KRILL OIL OMEGA-3 PO, Take 1 capsule by mouth daily., Disp: , Rfl:  .  letrozole (FEMARA) 2.5 MG tablet, TAKE 1 TABLET BY MOUTH EVERY DAY, Disp: 90 tablet, Rfl: 0 .  Magnesium 250 MG TABS, Take 250 mg by mouth daily. , Disp: , Rfl:  .  potassium gluconate 595 MG TABS, Take 595 mg by mouth daily., Disp: , Rfl:  .  vitamin B-12 (CYANOCOBALAMIN) 1000 MCG tablet, Take 1,000 mcg by mouth daily., Disp: , Rfl:  .  omeprazole (PRILOSEC) 20 MG capsule, Take 1 capsule (  20 mg total) by mouth daily., Disp: 90 capsule, Rfl: 1   Allergies  Allergen Reactions  . Tobramycin Itching, Swelling and Rash    Tobramycin eye drops caused swelling, itching, drainage, rash and peeling of the skin     Review of Systems  Constitutional: Negative for fatigue.  Respiratory: Negative.   Cardiovascular: Negative.  Negative for chest pain, palpitations and leg swelling.  Genitourinary: Positive for vaginal discharge.  Neurological: Positive for headaches. Negative for dizziness.  Psychiatric/Behavioral: Negative.      Today's Vitals   12/23/19 1613  BP: 118/80  Pulse: 90  Temp: 98.3 F (36.8 C)  TempSrc: Oral   Weight: 161 lb 3.2 oz (73.1 kg)  Height: 5' 0.2" (1.529 m)  PainSc: 0-No pain   Body mass index is 31.27 kg/m.   Objective:  Physical Exam Vitals reviewed.  Constitutional:      General: She is not in acute distress.    Appearance: Normal appearance. She is well-developed.  Eyes:     Extraocular Movements: Extraocular movements intact.     Conjunctiva/sclera: Conjunctivae normal.     Pupils: Pupils are equal, round, and reactive to light.  Cardiovascular:     Rate and Rhythm: Normal rate and regular rhythm.     Pulses: Normal pulses.     Heart sounds: Normal heart sounds. No murmur.  Pulmonary:     Effort: Pulmonary effort is normal.     Breath sounds: Normal breath sounds.  Musculoskeletal:        General: Normal range of motion.  Skin:    General: Skin is warm and dry.     Capillary Refill: Capillary refill takes less than 2 seconds.  Neurological:     General: No focal deficit present.     Mental Status: She is alert and oriented to person, place, and time.     Cranial Nerves: No cranial nerve deficit.  Psychiatric:        Mood and Affect: Mood normal.        Behavior: Behavior normal.        Thought Content: Thought content normal.        Judgment: Judgment normal.         Assessment And Plan:  1. Essential hypertension Blood pressure is well controlled continue with current medications  2. Vaginal discharge  Large leukocytes will send for urine culture, negative for nitrites or blood - POCT Urinalysis Dipstick (81002)  3. Urinary frequency  Urinalysis is negative for UTI will send for culture - Culture, Urine  4. Urine leukocytes  Will send for urine culture due to history of UTI's         [name], FNP    THE PATIENT IS ENCOURAGED TO PRACTICE SOCIAL DISTANCING DUE TO THE COVID-19 PANDEMIC.

## 2019-12-24 LAB — BMP8+EGFR
BUN/Creatinine Ratio: 18 (ref 12–28)
BUN: 14 mg/dL (ref 8–27)
CO2: 25 mmol/L (ref 20–29)
Calcium: 9.5 mg/dL (ref 8.7–10.3)
Chloride: 103 mmol/L (ref 96–106)
Creatinine, Ser: 0.76 mg/dL (ref 0.57–1.00)
GFR calc Af Amer: 84 mL/min/{1.73_m2} (ref >59)
GFR calc non Af Amer: 73 mL/min/{1.73_m2} (ref >59)
Glucose: 106 mg/dL — ABNORMAL HIGH (ref 65–99)
Potassium: 3.7 mmol/L (ref 3.5–5.2)
Sodium: 141 mmol/L (ref 134–144)

## 2019-12-24 LAB — URINE CULTURE

## 2019-12-24 NOTE — Progress Notes (Signed)
Patient Care Team: [name], FNP as PCP - General (General Practice) Excell Seltzer, MD (Inactive) as Consulting Physician (General Surgery) Nicholas Lose, MD as Consulting Physician (Hematology and Oncology) Gery Pray, MD as Consulting Physician (Radiation Oncology)  DIAGNOSIS:    ICD-10-CM   1. Malignant neoplasm of upper-inner quadrant of left breast in female, estrogen receptor positive ([place])  C50.212    Z17.0     SUMMARY OF ONCOLOGIC HISTORY: Oncology History  Malignant neoplasm of upper-inner quadrant of left breast in female, estrogen receptor positive ([place])  03/14/2018 Initial Diagnosis   Screening detected left breast mass 2.3 cm at 9:30 position biopsy revealed invasive ductal carcinoma grade 2 ER 90%, PR 5%, Ki-67 5%, HER-2 negative ratio 1.19, axilla negative, T2N0 stage Ib clinical stage   03/21/2018 Cancer Staging   Staging form: Breast, AJCC 8th Edition - Clinical stage from 03/21/2018: Stage IB (cT2, cN0, cM0, G2, ER+, PR+, HER2-) - Signed by Nicholas Lose, MD on 03/21/2018   04/06/2018 Surgery   Left lumpectomy: IDC with extravasated mucin, grade 2, 2.8 cm, intermediate grade DCIS, margins negative, ER 90%, PR 5%, HER-2 negative ratio 1.19, Ki-67 5%, T2N0 stage IA AJCC 8   04/25/2018 Cancer Staging   Staging form: Breast, AJCC 8th Edition - Pathologic: Stage IA (pT2, pN0, cM0, G2, ER+, PR+, HER2-) - Signed by Gardenia Phlegm, NP on 04/25/2018   05/16/2018 - 06/11/2018 Radiation Therapy   Adjuvant radiation therapy   07/2018 -  Anti-estrogen oral therapy   Letrozole daily      CHIEF COMPLIANT: Follow-up of left breast cancer on letrozole   INTERVAL HISTORY: Dawn Rodriguez is a 84 y.o. with above-mentioned history of left breast cancer treated with lumpectomy, radiation, and who is currently on antiestrogen therapy with letrozole. Mammogram on 06/06/19 showed no evidence of malignancy bilaterally. She presents to the clinic today for  follow-up.  Patient is complaining of right breast pain and discomfort.  Initially she thought it was her bra but then after change in the right she still has mild to moderate degree of discomfort.  ALLERGIES:  is allergic to tobramycin.  MEDICATIONS:  Current Outpatient Medications  Medication Sig Dispense Refill  . amLODipine (NORVASC) 2.5 MG tablet TAKE 1 TABLET EVERY DAY 90 tablet 1  . Calcium Carb-Cholecalciferol (CALCIUM-VITAMIN D3) 600-400 MG-UNIT TABS Take by mouth 2 (two) times daily.    . calcium carbonate (TUMS - DOSED IN MG ELEMENTAL CALCIUM) 500 MG chewable tablet Chew 1 tablet by mouth daily as needed for indigestion or heartburn.    . Garlic 3614 MG CAPS Take 1,000 mg by mouth daily.     . Glycerin-Polysorbate 80 (REFRESH DRY EYE THERAPY OP) Place 1-2 drops into both eyes 4 (four) times daily as needed (for dry eyes).     Marland Kitchen ipratropium (ATROVENT) 0.06 % nasal spray Place 2 sprays into both nostrils 4 (four) times daily. 15 mL 1  . KRILL OIL OMEGA-3 PO Take 1 capsule by mouth daily.    Marland Kitchen letrozole (FEMARA) 2.5 MG tablet TAKE 1 TABLET BY MOUTH EVERY DAY 90 tablet 0  . Magnesium 250 MG TABS Take 250 mg by mouth daily.     . mometasone (ELOCON) 0.1 % cream Apply to affected area daily 45 g 1  . omeprazole (PRILOSEC) 20 MG capsule Take 1 capsule (20 mg total) by mouth daily. 90 capsule 1  . potassium gluconate 595 MG TABS Take 595 mg by mouth daily.    . vitamin B-12 (  CYANOCOBALAMIN) 1000 MCG tablet Take 1,000 mcg by mouth daily.     No current facility-administered medications for this visit.    PHYSICAL EXAMINATION: ECOG PERFORMANCE STATUS: 1 - Symptomatic but completely ambulatory  Vitals:   12/25/19 1515  BP: (!) 141/58  Pulse: 69  Resp: 16  Temp: 98.5 F (36.9 C)  SpO2: 97%   Filed Weights   12/25/19 1515  Weight: 161 lb 14.4 oz (73.4 kg)    BREAST: No palpable masses or nodules in either right or left breasts. No palpable axillary supraclavicular or  infraclavicular adenopathy no breast tenderness or nipple discharge. (exam performed in the presence of a chaperone)  LABORATORY DATA:  I have reviewed the data as listed CMP Latest Ref Rng & Units 12/23/2019 10/29/2018 04/10/2018  Glucose 65 - 99 mg/dL 106(H) 94 109(H)  BUN 8 - 27 mg/dL _0 Creatinine 0.57 - 1.00 mg/dL 0.76 0.78 0.72  Sodium 134 - 144 mmol/L 141 143 143  Potassium 3.5 - 5.2 mmol/L 3.7 3.9 3.9  Chloride 96 - 106 mmol/L 103 102 105  CO2 20 - 29 mmol/L _1 Calcium 8.7 - 10.3 mg/dL 9.5 9.6 9.6  Total Protein 6.0 - 8.5 g/dL - 7.0 -  Total Bilirubin 0.0 - 1.2 mg/dL - 0.8 -  Alkaline Phos 39 - 117 IU/L - 74 -  AST 0 - 40 IU/L - 14 -  ALT 0 - 32 IU/L - 10 -    Lab Results  Component Value Date   WBC 8.5 10/29/2018   HGB 12.2 10/29/2018   HCT 36.5 10/29/2018   MCV 86 10/29/2018   PLT 273 10/29/2018   NEUTROABS 1.5 03/21/2018    ASSESSMENT & PLAN:  Malignant neoplasm of upper-inner quadrant of left breast in female, estrogen receptor positive ([facility]) 04/06/2018:Left lumpectomy: IDC with extravasated mucin, grade 2, 2.8 cm, intermediate grade DCIS, margins negative, ER 90%, PR 5%, HER-2 negative ratio 1.19, Ki-67 5%, T2N0 stage IA AJCC 8 Adjuvant radiation therapy 05/16/2018 to 06/11/2018  Treatment plan: Letrozole 2.5 mg daily start 07/16/2018  Letrozole toxicities:Denies any hot flashes or arthralgias or myalgias. She has trouble with insomnia.  She is experimenting with taking the medication at different times of the day. She does not think the insomnia is severe enough to warrant a change in medication at this time.  Osteoporosis: T score -2.9: on Fosamax.  She takes calcium and vitamin D. Right breast discomfort: We will obtain a mammogram and ultrasound of the right breast. Further investigation based on the findings of the mammogram and ultrasound.  Return to clinic in 1 year for follow-up.   No orders of the defined types were placed in this  encounter.  The patient has a good understanding of the overall plan. she agrees with it. she will call with any problems that may develop before the next visit here.  Total time spent: 30 mins including face to face time and time spent for planning, charting and coordination of care  Nicholas Lose, MD 12/25/2019  I, Cloyde Reams Dorshimer, am acting as scribe for Dr. Nicholas Lose.  I have reviewed the above documentation for accuracy and completeness, and I agree with the above.

## 2019-12-25 ENCOUNTER — Other Ambulatory Visit: Payer: Self-pay

## 2019-12-25 ENCOUNTER — Inpatient Hospital Stay: Payer: Medicare PPO | Attending: Hematology and Oncology | Admitting: Hematology and Oncology

## 2019-12-25 DIAGNOSIS — C50212 Malignant neoplasm of upper-inner quadrant of left female breast: Secondary | ICD-10-CM

## 2019-12-25 DIAGNOSIS — Z923 Personal history of irradiation: Secondary | ICD-10-CM | POA: Diagnosis not present

## 2019-12-25 DIAGNOSIS — Z79899 Other long term (current) drug therapy: Secondary | ICD-10-CM | POA: Diagnosis not present

## 2019-12-25 DIAGNOSIS — M810 Age-related osteoporosis without current pathological fracture: Secondary | ICD-10-CM | POA: Diagnosis not present

## 2019-12-25 DIAGNOSIS — Z7951 Long term (current) use of inhaled steroids: Secondary | ICD-10-CM | POA: Insufficient documentation

## 2019-12-25 DIAGNOSIS — Z79811 Long term (current) use of aromatase inhibitors: Secondary | ICD-10-CM | POA: Diagnosis not present

## 2019-12-25 DIAGNOSIS — Z170 Estrogen receptor positive status [ER+]: Secondary | ICD-10-CM | POA: Diagnosis not present

## 2019-12-25 NOTE — Assessment & Plan Note (Signed)
04/06/2018:Left lumpectomy: IDC with extravasated mucin, grade 2, 2.8 cm, intermediate grade DCIS, margins negative, ER 90%, PR 5%, HER-2 negative ratio 1.19, Ki-67 5%, T2N0 stage IA AJCC 8 Adjuvant radiation therapy 05/16/2018 to 06/11/2018  Treatment plan: Letrozole 2.5 mg daily start 07/16/2018  Letrozole toxicities:  Osteoporosis: T score -2.9: on Fosamax.  She takes calcium and vitamin D.  Return to clinic in 1 year for follow-up.

## 2019-12-26 ENCOUNTER — Telehealth: Payer: Self-pay | Admitting: Hematology and Oncology

## 2019-12-26 NOTE — Telephone Encounter (Signed)
Scheduled per 05/05 los, patient has been called and voicemail was left. 

## 2019-12-31 ENCOUNTER — Telehealth: Payer: Self-pay | Admitting: Nurse Practitioner

## 2019-12-31 NOTE — Telephone Encounter (Signed)
Called to make aware of her recent lab results. Her headache is better today, she is using the nasal spray for allergies which she feels has been effective. Reports she feels better today.

## 2020-01-06 ENCOUNTER — Other Ambulatory Visit: Payer: Medicare PPO

## 2020-01-08 ENCOUNTER — Emergency Department (HOSPITAL_COMMUNITY): Payer: Medicare PPO

## 2020-01-08 ENCOUNTER — Other Ambulatory Visit: Payer: Self-pay

## 2020-01-08 ENCOUNTER — Emergency Department (HOSPITAL_COMMUNITY)
Admission: EM | Admit: 2020-01-08 | Discharge: 2020-01-08 | Disposition: A | Discharge status: Home / Self Care | Payer: Medicare PPO | Attending: Emergency Medicine | Admitting: Emergency Medicine

## 2020-01-08 DIAGNOSIS — Y999 Unspecified external cause status: Secondary | ICD-10-CM | POA: Diagnosis not present

## 2020-01-08 DIAGNOSIS — I10 Essential (primary) hypertension: Secondary | ICD-10-CM | POA: Diagnosis not present

## 2020-01-08 DIAGNOSIS — Z853 Personal history of malignant neoplasm of breast: Secondary | ICD-10-CM | POA: Insufficient documentation

## 2020-01-08 DIAGNOSIS — M542 Cervicalgia: Secondary | ICD-10-CM | POA: Diagnosis not present

## 2020-01-08 DIAGNOSIS — S91111A Laceration without foreign body of right great toe without damage to nail, initial encounter: Secondary | ICD-10-CM | POA: Insufficient documentation

## 2020-01-08 DIAGNOSIS — R52 Pain, unspecified: Secondary | ICD-10-CM | POA: Diagnosis not present

## 2020-01-08 DIAGNOSIS — M7731 Calcaneal spur, right foot: Secondary | ICD-10-CM | POA: Diagnosis not present

## 2020-01-08 DIAGNOSIS — T148XXA Other injury of unspecified body region, initial encounter: Secondary | ICD-10-CM

## 2020-01-08 DIAGNOSIS — Y9389 Activity, other specified: Secondary | ICD-10-CM | POA: Insufficient documentation

## 2020-01-08 DIAGNOSIS — Y92410 Unspecified street and highway as the place of occurrence of the external cause: Secondary | ICD-10-CM | POA: Insufficient documentation
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
## 2020-01-08 NOTE — Discharge Instructions (Addendum)
You were evaluated in the Emergency Department and after careful evaluation, we did not find any emergent condition requiring admission or further testing in the hospital.  Your exam/testing today was overall reassuring.  The medical glue on your toe will slowly wear away with time.  Please keep the toe clean and dry, avoid submerging in water.  Please return to the Emergency Department if you experience any worsening of your condition.  We encourage you to follow up with a primary care provider.  Thank you for allowing [place] to be a part of your care.
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
## 2020-01-13 ENCOUNTER — Other Ambulatory Visit: Payer: Self-pay

## 2020-01-13 MED ORDER — IPRATROPIUM BROMIDE 0.06 % NA SOLN: 2.0000 | Freq: Four times a day (QID) | NASAL | 1 refills | Status: DC

## 2020-01-13 MED ORDER — MOMETASONE FUROATE 0.1 % EX CREA: TOPICAL_CREAM | CUTANEOUS | 1 refills | Status: DC

## 2020-01-16 ENCOUNTER — Other Ambulatory Visit: Payer: Self-pay | Admitting: Nurse Practitioner

## 2020-01-16 ENCOUNTER — Other Ambulatory Visit: Payer: Medicare PPO

## 2020-01-22 ENCOUNTER — Other Ambulatory Visit: Payer: Self-pay

## 2020-01-22 ENCOUNTER — Ambulatory Visit: Payer: Medicare PPO | Admitting: Nurse Practitioner

## 2020-01-22 ENCOUNTER — Encounter: Payer: Self-pay | Admitting: Nurse Practitioner

## 2020-01-22 DIAGNOSIS — S91111D Laceration without foreign body of right great toe without damage to nail, subsequent encounter: Secondary | ICD-10-CM | POA: Diagnosis not present

## 2020-01-22 DIAGNOSIS — T148XXA Other injury of unspecified body region, initial encounter: Secondary | ICD-10-CM | POA: Diagnosis not present
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
## 2020-02-12 ENCOUNTER — Other Ambulatory Visit: Payer: Self-pay | Admitting: Nurse Practitioner

## 2020-02-18 ENCOUNTER — Other Ambulatory Visit: Payer: Self-pay | Admitting: Nurse Practitioner

## 2020-02-25 ENCOUNTER — Other Ambulatory Visit: Payer: Self-pay | Admitting: Hematology and Oncology

## 2020-03-12 ENCOUNTER — Other Ambulatory Visit: Payer: Self-pay | Admitting: Hematology and Oncology

## 2020-03-12 ENCOUNTER — Ambulatory Visit
Admission: RE | Admit: 2020-03-12 | Discharge: 2020-03-12 | Disposition: A | Discharge status: Home / Self Care | Payer: Medicare PPO | Source: Ambulatory Visit | Attending: Hematology and Oncology | Admitting: Hematology and Oncology

## 2020-03-12 ENCOUNTER — Other Ambulatory Visit: Payer: Self-pay

## 2020-03-12 DIAGNOSIS — C50212 Malignant neoplasm of upper-inner quadrant of left female breast: Secondary | ICD-10-CM

## 2020-03-12 DIAGNOSIS — R921 Mammographic calcification found on diagnostic imaging of breast: Secondary | ICD-10-CM

## 2020-03-12 DIAGNOSIS — R922 Inconclusive mammogram: Secondary | ICD-10-CM | POA: Diagnosis not present

## 2020-03-12 DIAGNOSIS — Z170 Estrogen receptor positive status [ER+]: Secondary | ICD-10-CM

## 2020-03-13 ENCOUNTER — Other Ambulatory Visit: Payer: Self-pay | Admitting: *Deleted

## 2020-03-19 ENCOUNTER — Ambulatory Visit
Admission: RE | Admit: 2020-03-19 | Discharge: 2020-03-19 | Disposition: A | Discharge status: Home / Self Care | Payer: Medicare PPO | Source: Ambulatory Visit | Attending: Hematology and Oncology | Admitting: Hematology and Oncology

## 2020-03-19 ENCOUNTER — Other Ambulatory Visit: Payer: Self-pay

## 2020-03-19 DIAGNOSIS — Z170 Estrogen receptor positive status [ER+]: Secondary | ICD-10-CM

## 2020-03-19 DIAGNOSIS — R921 Mammographic calcification found on diagnostic imaging of breast: Secondary | ICD-10-CM

## 2020-03-19 DIAGNOSIS — C50212 Malignant neoplasm of upper-inner quadrant of left female breast: Secondary | ICD-10-CM

## 2020-03-19 DIAGNOSIS — R920 Mammographic microcalcification found on diagnostic imaging of breast: Secondary | ICD-10-CM | POA: Diagnosis not present

## 2020-03-19 DIAGNOSIS — N6489 Other specified disorders of breast: Secondary | ICD-10-CM | POA: Diagnosis not present

## 2020-03-19 HISTORY — PX: BREAST BIOPSY: SHX20

## 2020-03-25 ENCOUNTER — Other Ambulatory Visit: Payer: Self-pay

## 2020-03-25 ENCOUNTER — Ambulatory Visit: Payer: Medicare PPO | Admitting: Nurse Practitioner

## 2020-03-25 ENCOUNTER — Encounter: Payer: Self-pay | Admitting: Nurse Practitioner

## 2020-03-25 VITALS — Temp 97.9°F | Ht 63.8 in | Wt 155.6 lb

## 2020-03-25 DIAGNOSIS — R42 Dizziness and giddiness: Secondary | ICD-10-CM | POA: Diagnosis not present

## 2020-03-25 DIAGNOSIS — Z79899 Other long term (current) drug therapy: Secondary | ICD-10-CM | POA: Diagnosis not present

## 2020-03-25 DIAGNOSIS — H6122 Impacted cerumen, left ear: Secondary | ICD-10-CM

## 2020-03-25 DIAGNOSIS — H9312 Tinnitus, left ear: Secondary | ICD-10-CM | POA: Diagnosis not present

## 2020-03-25 NOTE — Patient Instructions (Signed)
Dizziness Dizziness is a common problem. It makes you feel unsteady or light-headed. You may feel like you are about to pass out (faint). Dizziness can lead to getting hurt if you stumble or fall. Dizziness can be caused by many things, including:  Medicines.  Not having enough water in your body (dehydration).  Illness. Follow these instructions at home: Eating and drinking   Drink enough fluid to keep your pee (urine) clear or pale yellow. This helps to keep you from getting dehydrated. Try to drink more clear fluids, such as water.  Do not drink alcohol.  Limit how much caffeine you drink or eat, if your doctor tells you to do that.  Limit how much salt (sodium) you drink or eat, if your doctor tells you to do that. Activity   Avoid making quick movements. ? When you stand up from sitting in a chair, steady yourself until you feel okay. ? In the morning, first sit up on the side of the bed. When you feel okay, stand slowly while you hold onto something. Do this until you know that your balance is fine.  If you need to stand in one place for a long time, move your legs often. Tighten and relax the muscles in your legs while you are standing.  Do not drive or use heavy machinery if you feel dizzy.  Avoid bending down if you feel dizzy. Place items in your home so you can reach them easily without leaning over. Lifestyle  Do not use any products that contain nicotine or tobacco, such as cigarettes and e-cigarettes. If you need help quitting, ask your doctor.  Try to lower your stress level. You can do this by using methods such as yoga or meditation. Talk with your doctor if you need help. General instructions  Watch your dizziness for any changes.  Take over-the-counter and prescription medicines only as told by your doctor. Talk with your doctor if you think that you are dizzy because of a medicine that you are taking.  Tell a friend or a family member that you are feeling  dizzy. If he or she notices any changes in your behavior, have this person call your doctor.  Keep all follow-up visits as told by your doctor. This is important. Contact a doctor if:  Your dizziness does not go away.  Your dizziness or light-headedness gets worse.  You feel sick to your stomach (nauseous).  You have trouble hearing.  You have new symptoms.  You are unsteady on your feet.  You feel like the room is spinning. Get help right away if:  You throw up (vomit) or have watery poop (diarrhea), and you cannot eat or drink anything.  You have trouble: ? Talking. ? Walking. ? Swallowing. ? Using your arms, hands, or legs.  You feel generally weak.  You are not thinking clearly, or you have trouble forming sentences. A friend or family member may notice this.  You have: ? Chest pain. ? Pain in your belly (abdomen). ? Shortness of breath. ? Sweating.  Your vision changes.  You are bleeding.  You have a very bad headache.  You have neck pain or a stiff neck.  You have a fever. These symptoms may be an emergency. Do not wait to see if the symptoms will go away. Get medical help right away. Call your local emergency services (911 in the [place] Do not drive yourself to the hospital. Summary  Dizziness makes you feel unsteady or light-headed. You   may feel like you are about to pass out (faint).  Drink enough fluid to keep your pee (urine) clear or pale yellow. Do not drink alcohol.  Avoid making quick movements if you feel dizzy.  Watch your dizziness for any changes. This information is not intended to replace advice given to you by your health care provider. Make sure you discuss any questions you have with your health care provider. Document Revised: 08/11/2017 Document Reviewed: 08/25/2016 Elsevier Patient Education  Regino Ramirez, Adult The ears produce a substance called earwax that helps keep bacteria out of the ear and protects  the skin in the ear canal. Occasionally, earwax can build up in the ear and cause discomfort or hearing loss. What increases the risk? This condition is more likely to develop in people who:  Are female.  Are elderly.  Naturally produce more earwax.  Clean their ears often with cotton swabs.  Use earplugs often.  Use in-ear headphones often.  Wear hearing aids.  Have narrow ear canals.  Have earwax that is overly thick or sticky.  Have eczema.  Are dehydrated.  Have excess hair in the ear canal. What are the signs or symptoms? Symptoms of this condition include:  Reduced or muffled hearing.  A feeling of fullness in the ear or feeling that the ear is plugged.  Fluid coming from the ear.  Ear pain.  Ear itch.  Ringing in the ear.  Coughing.  An obvious piece of earwax that can be seen inside the ear canal. How is this diagnosed? This condition may be diagnosed based on:  Your symptoms.  Your medical history.  An ear exam. During the exam, your health care provider will look into your ear with an instrument called an otoscope. You may have tests, including a hearing test. How is this treated? This condition may be treated by:  Using ear drops to soften the earwax.  Having the earwax removed by a health care provider. The health care provider may: ? Flush the ear with water. ? Use an instrument that has a loop on the end (curette). ? Use a suction device.  Surgery to remove the wax buildup. This may be done in severe cases. Follow these instructions at home:   Take over-the-counter and prescription medicines only as told by your health care provider.  Do not put any objects, including cotton swabs, into your ear. You can clean the opening of your ear canal with a washcloth or facial tissue.  Follow instructions from your health care provider about cleaning your ears. Do not over-clean your ears.  Drink enough fluid to keep your urine clear or pale  yellow. This will help to thin the earwax.  Keep all follow-up visits as told by your health care provider. If earwax builds up in your ears often or if you use hearing aids, consider seeing your health care provider for routine, preventive ear cleanings. Ask your health care provider how often you should schedule your cleanings.  If you have hearing aids, clean them according to instructions from the manufacturer and your health care provider. Contact a health care provider if:  You have ear pain.  You develop a fever.  You have blood, pus, or other fluid coming from your ear.  You have hearing loss.  You have ringing in your ears that does not go away.  Your symptoms do not improve with treatment.  You feel like the room is spinning (vertigo). Summary  Earwax can  build up in the ear and cause discomfort or hearing loss.  The most common symptoms of this condition include reduced or muffled hearing and a feeling of fullness in the ear or feeling that the ear is plugged.  This condition may be diagnosed based on your symptoms, your medical history, and an ear exam.  This condition may be treated by using ear drops to soften the earwax or by having the earwax removed by a health care provider.  Do not put any objects, including cotton swabs, into your ear. You can clean the opening of your ear canal with a washcloth or facial tissue. This information is not intended to replace advice given to you by your health care provider. Make sure you discuss any questions you have with your health care provider. Document Revised: 07/21/2017 Document Reviewed: 10/19/2016 Elsevier Patient Education  2020 [employer].

## 2020-03-25 NOTE — Progress Notes (Addendum)
This visit occurred during the SARS-CoV-2 public health emergency.  Safety protocols were in place, including screening questions prior to the visit, additional usage of staff PPE, and extensive cleaning of exam room while observing appropriate contact time as indicated for disinfecting solutions.  Subjective:     Patient ID: Dawn Rodriguez , female    DOB: 07-24-1936 , 84 y.o.   MRN: [id number]   Chief Complaint  Patient presents with  . Dizziness  . Tinnitus    HPI  Presents today with complaints of dizziness and tinnitus. She states that on Saturday she began to experience spinning in the room when standing with a headache. The ringing began on Sunday and she described it as a roaring. She is taking amlodipine and letrozole but has taking these medications for two year without symptoms. She has no loss of hearing or pain in the ear.  She has a history of allergies since moving from [place]  in 2005. She has tried tylenol for the headaches with relief.  Dizziness This is a new problem. The current episode started in the past 7 days. The problem occurs intermittently. The problem has been waxing and waning. Associated symptoms include headaches and vertigo. Pertinent negatives include no chest pain, congestion, nausea or sore throat. Nothing aggravates the symptoms. She has tried acetaminophen for the symptoms. The treatment provided no relief.     Past Medical History:  Diagnosis Date  . Arthritis   . Bilateral dry eyes   . Cancer ([place]) 02/2018   left breast cancer  . Cataracts, bilateral   . Chronic headache   . GERD (gastroesophageal reflux disease)   . Hypertension   . Personal history of radiation therapy   . TMJ syndrome      Family History  Problem Relation Age of Onset  . Pneumonia Mother      Current Outpatient Medications:  .  amLODipine (NORVASC) 2.5 MG tablet, TAKE 1 TABLET EVERY DAY, Disp: 90 tablet, Rfl: 1 .  Calcium Carb-Cholecalciferol (CALCIUM-VITAMIN  D3) 600-400 MG-UNIT TABS, Take by mouth 2 (two) times daily., Disp: , Rfl:  .  calcium carbonate (TUMS - DOSED IN MG ELEMENTAL CALCIUM) 500 MG chewable tablet, Chew 1 tablet by mouth daily as needed for indigestion or heartburn., Disp: , Rfl:  .  Garlic 6144 MG CAPS, Take 1,000 mg by mouth daily. , Disp: , Rfl:  .  Glycerin-Polysorbate 80 (REFRESH DRY EYE THERAPY OP), Place 1-2 drops into both eyes 4 (four) times daily as needed (for dry eyes). , Disp: , Rfl:  .  ipratropium (ATROVENT) 0.06 % nasal spray, PLACE 2 SPRAYS IN EACH NOSTRIL 4 TIMES DAILY, Disp: 15 mL, Rfl: 1 .  KRILL OIL OMEGA-3 PO, Take 1 capsule by mouth daily., Disp: , Rfl:  .  letrozole (FEMARA) 2.5 MG tablet, TAKE 1 TABLET BY MOUTH EVERY DAY, Disp: 90 tablet, Rfl: 3 .  Magnesium 250 MG TABS, Take 250 mg by mouth daily. , Disp: , Rfl:  .  mometasone (ELOCON) 0.1 % cream, Apply to affected area daily, Disp: 45 g, Rfl: 1 .  omeprazole (PRILOSEC) 20 MG capsule, Take 1 capsule (20 mg total) by mouth daily., Disp: 90 capsule, Rfl: 1 .  potassium gluconate 595 MG TABS, Take 595 mg by mouth daily., Disp: , Rfl:  .  vitamin B-12 (CYANOCOBALAMIN) 1000 MCG tablet, Take 1,000 mcg by mouth daily., Disp: , Rfl:    Allergies  Allergen Reactions  . Tobramycin Itching, Swelling and Rash  Tobramycin eye drops caused swelling, itching, drainage, rash and peeling of the skin     Review of Systems  Constitutional: Negative.   HENT: Negative for congestion and sore throat.   Eyes: Negative.   Respiratory: Negative.   Cardiovascular: Negative for chest pain, palpitations and leg swelling.  Gastrointestinal: Negative.  Negative for nausea.  Genitourinary: Negative.   Musculoskeletal: Negative.   Skin: Negative.   Neurological: Positive for dizziness, vertigo and headaches.       None since Saturday  Psychiatric/Behavioral: Negative.      Today's Vitals   03/25/20 1037  Temp: 97.9 F (36.6 C)  Weight: 155 lb 9.6 oz (70.6 kg)  Height:  5' 3.8" (1.621 m)  PainSc: 0-No pain   Body mass index is 26.88 kg/m.   Objective:  Physical Exam Constitutional:      Appearance: Normal appearance. She is normal weight.  HENT:     Right Ear: Tympanic membrane, ear canal and external ear normal. There is no impacted cerumen.     Left Ear: External ear normal. There is impacted cerumen.  Cardiovascular:     Rate and Rhythm: Normal rate and regular rhythm.     Pulses: Normal pulses.     Heart sounds: Normal heart sounds.  Pulmonary:     Effort: Pulmonary effort is normal.     Breath sounds: Normal breath sounds. No wheezing.  Musculoskeletal:        General: Normal range of motion.     Cervical back: No tenderness.  Lymphadenopathy:     Cervical: No cervical adenopathy.  Skin:    General: Skin is warm and dry.     Capillary Refill: Capillary refill takes less than 2 seconds.  Neurological:     General: No focal deficit present.     Mental Status: She is oriented to person, place, and time.     Comments: Headaches none since Saturday 8/10 decreased with Tylenol  Psychiatric:        Mood and Affect: Mood normal.        Behavior: Behavior normal.        Thought Content: Thought content normal.        Judgment: Judgment normal.         Assessment And Plan:     1. Dizziness  orthostats are slightly positive with her systolic decreased about 30 mmHg when standing, she is encouraged to increase her water intake.  - CBC - BMP8+eGFR - Vitamin B12 - TSH  2. Tinnitus of left ear  Will check thyroid levels.  - TSH  3. Impacted cerumen of left ear  Hard cerumen removed with water irrigation this could be causing her dizziness and tinnitus   Patient was given opportunity to ask questions. Patient verbalized understanding of the plan and was able to repeat key elements of the plan. All questions were answered to their satisfaction.  [name], FNP   I, [name], FNP, have reviewed all documentation for this  visit. The documentation on 03/25/20 for the exam, diagnosis, procedures, and orders are all accurate and complete.  THE PATIENT IS ENCOURAGED TO PRACTICE SOCIAL DISTANCING DUE TO THE COVID-19 PANDEMIC.

## 2020-03-26 LAB — CBC
Hematocrit: 38.5 % (ref 34.0–46.6)
Hemoglobin: 12.2 g/dL (ref 11.1–15.9)
MCH: 27.7 pg (ref 26.6–33.0)
MCHC: 31.7 g/dL (ref 31.5–35.7)
MCV: 87 fL (ref 79–97)
Platelets: 258 10*3/uL (ref 150–450)
RBC: 4.41 x10E6/uL (ref 3.77–5.28)
RDW: 13.9 % (ref 11.7–15.4)
WBC: 4 10*3/uL (ref 3.4–10.8)

## 2020-03-26 LAB — TSH: TSH: 0.915 u[IU]/mL (ref 0.450–4.500)

## 2020-03-26 LAB — BMP8+EGFR
BUN/Creatinine Ratio: 14 (ref 12–28)
BUN: 11 mg/dL (ref 8–27)
CO2: 24 mmol/L (ref 20–29)
Calcium: 9.6 mg/dL (ref 8.7–10.3)
Chloride: 105 mmol/L (ref 96–106)
Creatinine, Ser: 0.81 mg/dL (ref 0.57–1.00)
GFR calc Af Amer: 78 mL/min/{1.73_m2} (ref >59)
GFR calc non Af Amer: 68 mL/min/{1.73_m2} (ref >59)
Glucose: 83 mg/dL (ref 65–99)
Potassium: 4.2 mmol/L (ref 3.5–5.2)
Sodium: 144 mmol/L (ref 134–144)

## 2020-03-26 LAB — VITAMIN B12: Vitamin B-12: 1480 pg/mL — ABNORMAL HIGH (ref 232–1245)

## 2020-04-28 ENCOUNTER — Telehealth: Payer: Self-pay

## 2020-04-28 NOTE — Telephone Encounter (Signed)
All of your labs are normal except vitamin b12 is elevated are you taking a supplement.   Per pt she hasn't taken her supplement in over 2wks, she will discontinue them  Per JM drink lemon water and she will recheck at next visit

## 2020-05-01 ENCOUNTER — Other Ambulatory Visit: Payer: Self-pay | Admitting: Nurse Practitioner

## 2020-05-14 DIAGNOSIS — H04123 Dry eye syndrome of bilateral lacrimal glands: Secondary | ICD-10-CM | POA: Diagnosis not present

## 2020-05-14 DIAGNOSIS — H40013 Open angle with borderline findings, low risk, bilateral: Secondary | ICD-10-CM | POA: Diagnosis not present

## 2020-05-14 DIAGNOSIS — H35371 Puckering of macula, right eye: Secondary | ICD-10-CM | POA: Diagnosis not present

## 2020-05-14 DIAGNOSIS — S0500XA Injury of conjunctiva and corneal abrasion without foreign body, unspecified eye, initial encounter: Secondary | ICD-10-CM | POA: Diagnosis not present

## 2020-05-14 DIAGNOSIS — H18513 Endothelial corneal dystrophy, bilateral: Secondary | ICD-10-CM | POA: Diagnosis not present

## 2020-05-26 DIAGNOSIS — H1045 Other chronic allergic conjunctivitis: Secondary | ICD-10-CM | POA: Diagnosis not present

## 2020-05-26 DIAGNOSIS — H524 Presbyopia: Secondary | ICD-10-CM | POA: Diagnosis not present

## 2020-05-26 DIAGNOSIS — H18513 Endothelial corneal dystrophy, bilateral: Secondary | ICD-10-CM | POA: Diagnosis not present

## 2020-05-26 DIAGNOSIS — H35371 Puckering of macula, right eye: Secondary | ICD-10-CM | POA: Diagnosis not present

## 2020-05-26 DIAGNOSIS — H40013 Open angle with borderline findings, low risk, bilateral: Secondary | ICD-10-CM | POA: Diagnosis not present

## 2020-05-26 DIAGNOSIS — H16223 Keratoconjunctivitis sicca, not specified as Sjogren's, bilateral: Secondary | ICD-10-CM | POA: Diagnosis not present

## 2020-06-01 ENCOUNTER — Other Ambulatory Visit: Payer: Self-pay | Admitting: Nurse Practitioner

## 2020-06-23 ENCOUNTER — Encounter: Payer: Medicare PPO | Admitting: Nurse Practitioner

## 2020-06-23 ENCOUNTER — Ambulatory Visit: Payer: Medicare PPO

## 2020-06-24 ENCOUNTER — Ambulatory Visit: Payer: Medicare PPO

## 2020-06-24 ENCOUNTER — Encounter: Payer: Medicare PPO | Admitting: Nurse Practitioner

## 2020-06-30 ENCOUNTER — Ambulatory Visit (INDEPENDENT_AMBULATORY_CARE_PROVIDER_SITE_OTHER): Payer: Medicare PPO

## 2020-06-30 ENCOUNTER — Ambulatory Visit (HOSPITAL_COMMUNITY)
Admission: EM | Admit: 2020-06-30 | Discharge: 2020-06-30 | Disposition: A | Discharge status: Home / Self Care | Payer: Medicare PPO | Attending: Family Medicine | Admitting: Family Medicine

## 2020-06-30 ENCOUNTER — Other Ambulatory Visit: Payer: Self-pay

## 2020-06-30 ENCOUNTER — Encounter (HOSPITAL_COMMUNITY): Payer: Self-pay | Admitting: Emergency Medicine

## 2020-06-30 DIAGNOSIS — M25511 Pain in right shoulder: Secondary | ICD-10-CM

## 2020-06-30 DIAGNOSIS — S4991XA Unspecified injury of right shoulder and upper arm, initial encounter: Secondary | ICD-10-CM | POA: Diagnosis not present

## 2020-06-30 DIAGNOSIS — M79641 Pain in right hand: Secondary | ICD-10-CM

## 2020-06-30 DIAGNOSIS — Z043 Encounter for examination and observation following other accident: Secondary | ICD-10-CM | POA: Diagnosis not present

## 2020-06-30 DIAGNOSIS — M79631 Pain in right forearm: Secondary | ICD-10-CM

## 2020-06-30 DIAGNOSIS — W19XXXA Unspecified fall, initial encounter: Secondary | ICD-10-CM

## 2020-06-30 MED ORDER — TRAMADOL HCL 50 MG PO TABS: 50.0000 mg | ORAL_TABLET | Freq: Four times a day (QID) | ORAL | 0 refills | Status: DC | PRN

## 2020-06-30 NOTE — Discharge Instructions (Addendum)
Ice Rest Tramadol for pain See your doctor if not improving in a few days

## 2020-06-30 NOTE — ED Provider Notes (Signed)
[facility]    CSN: [id number] Arrival date & time: 06/30/20  1616      History   Chief Complaint Chief Complaint  Patient presents with  . Fall  . Shoulder Pain    HPI Dawn Rodriguez is a 84 y.o. female.   HPI  Patient states that she was walking along a broad broad walkway.  She saw a brick that she thought was loose and she struck her foot out to kick it out of the way, it was actually attached.  This caused her to fall forward.  She landed on both knees and her right arm.  She states her knees are little bit sore but she got up and was able to walk.  Right knee is a little more swollen than the left.  She states that she has pain in her right hand, right elbow, and right shoulder.  Is getting progressively worse as the day progresses.  Her right hand has become swollen.  She is here desiring x-rays of her injured right arm.  She did not hit her head, lose consciousness, does not have any headache or visual disturbance.  After she fell she drove her granddaughter to school and went to [place] before she realized her arm was hurting  Past Medical History:  Diagnosis Date  . Arthritis   . Bilateral dry eyes   . Cancer ([place]) 02/2018   left breast cancer  . Cataracts, bilateral   . Chronic headache   . GERD (gastroesophageal reflux disease)   . Hypertension   . Personal history of radiation therapy   . TMJ syndrome     Patient Active Problem List   Diagnosis Date Noted  . Encounter for Papanicolaou smear of cervix 11/12/2018  . Vaginal discharge 11/12/2018  . UTI (urinary tract infection) 05/09/2018  . Malignant neoplasm of upper-inner quadrant of left breast in female, estrogen receptor positive ([place]) 03/20/2018  . Cataracts, bilateral   . GERD (gastroesophageal reflux disease)   . OSTEOARTHRITIS 10/04/2007  . INTERNAL HEMORRHOIDS 09/14/2007  . DIVERTICULOSIS, SIGMOID COLON 09/14/2007    Past Surgical History:  Procedure Laterality Date  .  BREAST BIOPSY Right 2012   stereotatic biopsy  . BREAST LUMPECTOMY Left 04/06/2018   Procedure: LEFT BREAST LUMPECTOMY;  Surgeon: Excell Seltzer, MD;  Location: [facility];  Service: General;  Laterality: Left;  . CATARACT EXTRACTION      OB History   No obstetric history on file.      Home Medications    Prior to Admission medications   Medication Sig Start Date End Date Taking? Authorizing Provider  amLODipine (NORVASC) 2.5 MG tablet TAKE 1 TABLET EVERY DAY 02/19/20   [name], FNP  Calcium Carb-Cholecalciferol (CALCIUM-VITAMIN D3) 600-400 MG-UNIT TABS Take by mouth 2 (two) times daily.    [provider]  calcium carbonate (TUMS - DOSED IN MG ELEMENTAL CALCIUM) 500 MG chewable tablet Chew 1 tablet by mouth daily as needed for indigestion or heartburn.    [provider]  Garlic 4098 MG CAPS Take 1,000 mg by mouth daily.     [provider]  Glycerin-Polysorbate 80 (REFRESH DRY EYE THERAPY OP) Place 1-2 drops into both eyes 4 (four) times daily as needed (for dry eyes).     [provider]  ipratropium (ATROVENT) 0.06 % nasal spray PLACE 2 SPRAYS IN EACH NOSTRIL 4 TIMES DAILY 02/12/20   [name], FNP  KRILL OIL OMEGA-3 PO Take 1 capsule by  mouth daily.    [provider]  letrozole (FEMARA) 2.5 MG tablet TAKE 1 TABLET BY MOUTH EVERY DAY 02/25/20   Nicholas Lose, MD  Magnesium 250 MG TABS Take 250 mg by mouth daily.     [provider]  mometasone (ELOCON) 0.1 % cream APPLY TO AFFECTED AREA DAILY 06/01/20   [name], FNP  omeprazole (PRILOSEC) 20 MG capsule TAKE 1 CAPSULE EVERY DAY 05/04/20   [name], FNP  potassium gluconate 595 MG TABS Take 595 mg by mouth daily.    [provider]  traMADol (ULTRAM) 50 MG tablet Take 1 tablet (50 mg total) by mouth every 6 (six) hours as needed. 06/30/20   Raylene Everts, MD  vitamin B-12 (CYANOCOBALAMIN) 1000 MCG tablet Take 1,000 mcg by mouth daily.     [provider]    Family History Family History  Problem Relation Age of Onset  . Pneumonia Mother     Social History Social History   Tobacco Use  . Smoking status: Never Smoker  . Smokeless tobacco: Never Used  Vaping Use  . Vaping Use: Never used  Substance Use Topics  . Alcohol use: No  . Drug use: No     Allergies   Tobramycin   Review of Systems Review of Systems  See HPI Physical Exam Triage Vital Signs ED Triage Vitals  Enc Vitals Group     BP 06/30/20 1748 (!) 150/60     Pulse Rate 06/30/20 1748 83     Resp 06/30/20 1748 18     Temp 06/30/20 1748 98.7 F (37.1 C)     Temp Source 06/30/20 1748 Oral     SpO2 06/30/20 1748 100 %     Weight --      Height --      Head Circumference --      Peak Flow --      Pain Score 06/30/20 1747 10     Pain Loc --      Pain Edu? --      Excl. in [place]? --    No data found.  Updated Vital Signs BP (!) 150/60 (BP Location: Left Arm)   Pulse 83   Temp 98.7 F (37.1 C) (Oral)   Resp 18   SpO2 100%       Physical Exam Constitutional:      General: She is not in acute distress.    Appearance: Normal appearance. She is well-developed and normal weight.     Comments: Is in wheelchair.  Appears uncomfortable cradling right arm close to body.  HENT:     Head: Normocephalic and atraumatic.     Mouth/Throat:     Comments: Mask is in place Eyes:     Conjunctiva/sclera: Conjunctivae normal.     Pupils: Pupils are equal, round, and reactive to light.  Cardiovascular:     Rate and Rhythm: Normal rate.  Pulmonary:     Effort: Pulmonary effort is normal. No respiratory distress.  Abdominal:     Palpations: Abdomen is soft.  Musculoskeletal:        General: Normal range of motion.     Cervical back: Normal range of motion.     Comments: Both knees are examined.  There is no ecchymosis.  There is no abrasion.  Right knee is slightly more swollen than the left knee.  There is no bony tenderness.  No  instability.  The right arm has swelling over the lateral hand and tenderness  upon palpation of the fourth and fifth metacarpals.  No tenderness in the carpal region or the wrist.  Patient has moderate tenderness in her elbow over the lateral medial epicondyles.  Resists movement of elbow.  Patient also has tenderness over her AC joint of the shoulder.  Resist shoulder movement.  Skin:    General: Skin is warm and dry.  Neurological:     Mental Status: She is alert.     Gait: Gait abnormal.  Psychiatric:        Behavior: Behavior normal.      UC Treatments / Results  Labs (all labs ordered are listed, but only abnormal results are displayed) Labs Reviewed - No data to display  EKG   Radiology DG Forearm Right  Result Date: 06/30/2020 CLINICAL DATA:  Right forearm pain after fall today. EXAM: RIGHT FOREARM - 2 VIEW COMPARISON:  None. FINDINGS: There is no evidence of fracture or other focal bone lesions. Soft tissues are unremarkable. IMPRESSION: Negative. Electronically Signed   By: Marijo Conception M.D.   On: 06/30/2020 18:38   DG Humerus Right  Result Date: 06/30/2020 CLINICAL DATA:  84 year old female status post fall this morning. EXAM: RIGHT HUMERUS - 2+ VIEW COMPARISON:  Chest radiographs 01/29/2017. FINDINGS: Bone mineralization is within normal limits for age. Alignment appears preserved at the right shoulder and elbow. There is no evidence of fracture or other focal bone lesions. Stable visible right chest. IMPRESSION: No acute fracture or dislocation identified about the right humerus. Electronically Signed   By: Genevie Ann M.D.   On: 06/30/2020 18:45   DG Hand Complete Right  Result Date: 06/30/2020 CLINICAL DATA:  Right hand pain after fall today. EXAM: RIGHT HAND - COMPLETE 3+ VIEW COMPARISON:  None. FINDINGS: There is no evidence of fracture or dislocation. Moderate degenerative changes seen involving first carpometacarpal joint. Soft tissues are unremarkable. IMPRESSION:  Moderate osteoarthritis of first carpometacarpal joint. No acute abnormality seen in the right hand. Electronically Signed   By: Marijo Conception M.D.   On: 06/30/2020 18:37    Procedures Procedures (including critical care time)  Medications Ordered in UC Medications - No data to display  Initial Impression / Assessment and Plan / UC Course  I have reviewed the triage vital signs and the nursing notes.  Pertinent labs & imaging results that were available during my care of the patient were reviewed by me and considered in my medical decision making (see chart for details).     Ice, immobilization, pain management.  Follow-up with personal physician Final Clinical Impressions(s) / UC Diagnoses   Final diagnoses:  Acute pain of right shoulder  Fall, initial encounter     Discharge Instructions     Ice Rest Tramadol for pain See your doctor if not improving in a few days    ED Prescriptions    Medication Sig Dispense Auth. Provider   traMADol (ULTRAM) 50 MG tablet Take 1 tablet (50 mg total) by mouth every 6 (six) hours as needed. 15 tablet Raylene Everts, MD     I have reviewed the PDMP during this encounter.   Raylene Everts, MD 06/30/20 2102

## 2020-07-01 ENCOUNTER — Encounter: Payer: Medicare PPO | Admitting: Nurse Practitioner

## 2020-07-15 ENCOUNTER — Other Ambulatory Visit: Payer: Self-pay

## 2020-07-15 ENCOUNTER — Ambulatory Visit: Payer: Medicare PPO

## 2020-07-15 VITALS — BP 138/70 | HR 87 | Temp 98.0°F | Ht 64.0 in | Wt 159.4 lb

## 2020-07-15 DIAGNOSIS — Z0000 Encounter for general adult medical examination without abnormal findings: Secondary | ICD-10-CM | POA: Diagnosis not present

## 2020-07-15 NOTE — Patient Instructions (Signed)
Dawn Rodriguez , Thank you for taking time to come for your Medicare Wellness Visit. I appreciate your ongoing commitment to your health goals. Please review the following plan we discussed and let me know if I can assist you in the future.   Screening recommendations/referrals: Colonoscopy: completed 2017 per patient, due 2022 Mammogram: completed 03/19/2020 Bone Density: completed 07/21/2017 Recommended yearly ophthalmology/optometry visit for glaucoma screening and checkup Recommended yearly dental visit for hygiene and checkup  Vaccinations: Influenza vaccine: decline Pneumococcal vaccine: completed 06/23/2019 Tdap vaccine: completed 05/17/2013, due 05/18/2023 Shingles vaccine: discussed   Covid-19: 12/09/2019, 11/08/2019  Advanced directives: Advance directive discussed with you today. Even though you declined this today please call our office should you change your mind and we can give you the proper paperwork for you to fill out.  Conditions/risks identified: none  Next appointment: 09/29/2020 at 9:45 Follow up in one year for your annual wellness visit    Preventive Care 65 Years and Older, Female Preventive care refers to lifestyle choices and visits with your health care provider that can promote health and wellness. What does preventive care include?  A yearly physical exam. This is also called an annual well check.  Dental exams once or twice a year.  Routine eye exams. Ask your health care provider how often you should have your eyes checked.  Personal lifestyle choices, including:  Daily care of your teeth and gums.  Regular physical activity.  Eating a healthy diet.  Avoiding tobacco and drug use.  Limiting alcohol use.  Practicing safe sex.  Taking low-dose aspirin every day.  Taking vitamin and mineral supplements as recommended by your health care provider. What happens during an annual well check? The services and screenings done by your health care  provider during your annual well check will depend on your age, overall health, lifestyle risk factors, and family history of disease. Counseling  Your health care provider may ask you questions about your:  Alcohol use.  Tobacco use.  Drug use.  Emotional well-being.  Home and relationship well-being.  Sexual activity.  Eating habits.  History of falls.  Memory and ability to understand (cognition).  Work and work Statistician.  Reproductive health. Screening  You may have the following tests or measurements:  Height, weight, and BMI.  Blood pressure.  Lipid and cholesterol levels. These may be checked every 5 years, or more frequently if you are over 42 years old.  Skin check.  Lung cancer screening. You may have this screening every year starting at age 22 if you have a 30-pack-year history of smoking and currently smoke or have quit within the past 15 years.  Fecal occult blood test (FOBT) of the stool. You may have this test every year starting at age 23.  Flexible sigmoidoscopy or colonoscopy. You may have a sigmoidoscopy every 5 years or a colonoscopy every 10 years starting at age 49.  Hepatitis C blood test.  Hepatitis B blood test.  Sexually transmitted disease (STD) testing.  Diabetes screening. This is done by checking your blood sugar (glucose) after you have not eaten for a while (fasting). You may have this done every 1-3 years.  Bone density scan. This is done to screen for osteoporosis. You may have this done starting at age 32.  Mammogram. This may be done every 1-2 years. Talk to your health care provider about how often you should have regular mammograms. Talk with your health care provider about your test results, treatment options, and if necessary,  the need for more tests. Vaccines  Your health care provider may recommend certain vaccines, such as:  Influenza vaccine. This is recommended every year.  Tetanus, diphtheria, and acellular  pertussis (Tdap, Td) vaccine. You may need a Td booster every 10 years.  Zoster vaccine. You may need this after age 23.  Pneumococcal 13-valent conjugate (PCV13) vaccine. One dose is recommended after age 58.  Pneumococcal polysaccharide (PPSV23) vaccine. One dose is recommended after age 42. Talk to your health care provider about which screenings and vaccines you need and how often you need them. This information is not intended to replace advice given to you by your health care provider. Make sure you discuss any questions you have with your health care provider. Document Released: 09/04/2015 Document Revised: 04/27/2016 Document Reviewed: 06/09/2015 Elsevier Interactive Patient Education  2017 [place] Prevention in the Home Falls can cause injuries. They can happen to people of all ages. There are many things you can do to make your home safe and to help prevent falls. What can I do on the outside of my home?  Regularly fix the edges of walkways and driveways and fix any cracks.  Remove anything that might make you trip as you walk through a door, such as a raised step or threshold.  Trim any bushes or trees on the path to your home.  Use bright outdoor lighting.  Clear any walking paths of anything that might make someone trip, such as rocks or tools.  Regularly check to see if handrails are loose or broken. Make sure that both sides of any steps have handrails.  Any raised decks and porches should have guardrails on the edges.  Have any leaves, snow, or ice cleared regularly.  Use sand or salt on walking paths during winter.  Clean up any spills in your garage right away. This includes oil or grease spills. What can I do in the bathroom?  Use night lights.  Install grab bars by the toilet and in the tub and shower. Do not use towel bars as grab bars.  Use non-skid mats or decals in the tub or shower.  If you need to sit down in the shower, use a plastic,  non-slip stool.  Keep the floor dry. Clean up any water that spills on the floor as soon as it happens.  Remove soap buildup in the tub or shower regularly.  Attach bath mats securely with double-sided non-slip rug tape.  Do not have throw rugs and other things on the floor that can make you trip. What can I do in the bedroom?  Use night lights.  Make sure that you have a light by your bed that is easy to reach.  Do not use any sheets or blankets that are too big for your bed. They should not hang down onto the floor.  Have a firm chair that has side arms. You can use this for support while you get dressed.  Do not have throw rugs and other things on the floor that can make you trip. What can I do in the kitchen?  Clean up any spills right away.  Avoid walking on wet floors.  Keep items that you use a lot in easy-to-reach places.  If you need to reach something above you, use a strong step stool that has a grab bar.  Keep electrical cords out of the way.  Do not use floor polish or wax that makes floors slippery. If you must use  wax, use non-skid floor wax.  Do not have throw rugs and other things on the floor that can make you trip. What can I do with my stairs?  Do not leave any items on the stairs.  Make sure that there are handrails on both sides of the stairs and use them. Fix handrails that are broken or loose. Make sure that handrails are as long as the stairways.  Check any carpeting to make sure that it is firmly attached to the stairs. Fix any carpet that is loose or worn.  Avoid having throw rugs at the top or bottom of the stairs. If you do have throw rugs, attach them to the floor with carpet tape.  Make sure that you have a light switch at the top of the stairs and the bottom of the stairs. If you do not have them, ask someone to add them for you. What else can I do to help prevent falls?  Wear shoes that:  Do not have high heels.  Have rubber  bottoms.  Are comfortable and fit you well.  Are closed at the toe. Do not wear sandals.  If you use a stepladder:  Make sure that it is fully opened. Do not climb a closed stepladder.  Make sure that both sides of the stepladder are locked into place.  Ask someone to hold it for you, if possible.  Clearly mark and make sure that you can see:  Any grab bars or handrails.  First and last steps.  Where the edge of each step is.  Use tools that help you move around (mobility aids) if they are needed. These include:  Canes.  Walkers.  Scooters.  Crutches.  Turn on the lights when you go into a dark area. Replace any light bulbs as soon as they burn out.  Set up your furniture so you have a clear path. Avoid moving your furniture around.  If any of your floors are uneven, fix them.  If there are any pets around you, be aware of where they are.  Review your medicines with your doctor. Some medicines can make you feel dizzy. This can increase your chance of falling. Ask your doctor what other things that you can do to help prevent falls. This information is not intended to replace advice given to you by your health care provider. Make sure you discuss any questions you have with your health care provider. Document Released: 06/04/2009 Document Revised: 01/14/2016 Document Reviewed: 09/12/2014 Elsevier Interactive Patient Education  2017 [employer].

## 2020-07-15 NOTE — Progress Notes (Signed)
This visit occurred during the SARS-CoV-2 public health emergency.  Safety protocols were in place, including screening questions prior to the visit, additional usage of staff PPE, and extensive cleaning of exam room while observing appropriate contact time as indicated for disinfecting solutions.  Subjective:   Dawn Rodriguez is a 84 y.o. female who presents for Medicare Annual (Subsequent) preventive examination.  Review of Systems     Cardiac Risk Factors include: advanced age (>16men, >14 women)     Objective:    Today's Vitals   07/15/20 1141  BP: 138/70  Pulse: 87  Temp: 98 F (36.7 C)  TempSrc: Oral  SpO2: 98%  Weight: 159 lb 6.4 oz (72.3 kg)  Height: 5\' 4"  (1.626 m)   Body mass index is 27.36 kg/m.  Advanced Directives 07/15/2020 06/19/2019 07/16/2018 05/23/2018 04/19/2018 04/10/2018 04/06/2018  Does Patient Have a Medical Advance Directive? No No No No No No No  Would patient like information on creating a medical advance directive? - - - Yes (MAU/Ambulatory/Procedural Areas - Information given) - No - Patient declined No - Patient declined    Current Medications (verified) Outpatient Encounter Medications as of 07/15/2020  Medication Sig  . amLODipine (NORVASC) 2.5 MG tablet TAKE 1 TABLET EVERY DAY  . Calcium Carb-Cholecalciferol (CALCIUM-VITAMIN D3) 600-400 MG-UNIT TABS Take by mouth 2 (two) times daily.  . calcium carbonate (TUMS - DOSED IN MG ELEMENTAL CALCIUM) 500 MG chewable tablet Chew 1 tablet by mouth daily as needed for indigestion or heartburn.  . Garlic 8841 MG CAPS Take 1,000 mg by mouth daily.   . Glycerin-Polysorbate 80 (REFRESH DRY EYE THERAPY OP) Place 1-2 drops into both eyes 4 (four) times daily as needed (for dry eyes).   Marland Kitchen KRILL OIL OMEGA-3 PO Take 1 capsule by mouth daily.  Marland Kitchen letrozole (FEMARA) 2.5 MG tablet TAKE 1 TABLET BY MOUTH EVERY DAY  . Magnesium 250 MG TABS Take 250 mg by mouth daily.   . mometasone (ELOCON) 0.1 % cream APPLY TO  AFFECTED AREA DAILY  . omeprazole (PRILOSEC) 20 MG capsule TAKE 1 CAPSULE EVERY DAY  . potassium gluconate 595 MG TABS Take 595 mg by mouth daily.  Marland Kitchen ipratropium (ATROVENT) 0.06 % nasal spray PLACE 2 SPRAYS IN EACH NOSTRIL 4 TIMES DAILY (Patient not taking: Reported on 07/15/2020)  . traMADol (ULTRAM) 50 MG tablet Take 1 tablet (50 mg total) by mouth every 6 (six) hours as needed. (Patient not taking: Reported on 07/15/2020)  . vitamin B-12 (CYANOCOBALAMIN) 1000 MCG tablet Take 1,000 mcg by mouth daily. (Patient not taking: Reported on 07/15/2020)   No facility-administered encounter medications on file as of 07/15/2020.    Allergies (verified) Tobramycin   History: Past Medical History:  Diagnosis Date  . Arthritis   . Bilateral dry eyes   . Cancer ([facility]) 02/2018   left breast cancer  . Cataracts, bilateral   . Chronic headache   . GERD (gastroesophageal reflux disease)   . Hypertension   . Personal history of radiation therapy   . TMJ syndrome    Past Surgical History:  Procedure Laterality Date  . BREAST BIOPSY Right 2012   stereotatic biopsy  . BREAST LUMPECTOMY Left 04/06/2018   Procedure: LEFT BREAST LUMPECTOMY;  Surgeon: Excell Seltzer, MD;  Location: [facility];  Service: General;  Laterality: Left;  . CATARACT EXTRACTION     Family History  Problem Relation Age of Onset  . Pneumonia Mother    Social History   Socioeconomic History  .  Marital status: Divorced    Spouse name: Not on file  . Number of children: 2  . Years of education: Not on file  . Highest education level: Not on file  Occupational History  . Occupation: retired  Tobacco Use  . Smoking status: Never Smoker  . Smokeless tobacco: Never Used  Vaping Use  . Vaping Use: Never used  Substance and Sexual Activity  . Alcohol use: No  . Drug use: No  . Sexual activity: Not Currently    Birth control/protection: None  Other Topics Concern  . Not on file  Social History  Narrative  . Not on file   Social Determinants of Health   Financial Resource Strain: Low Risk   . Difficulty of Paying Living Expenses: Not hard at all  Food Insecurity: No Food Insecurity  . Worried About Charity fundraiser in the Last Year: Never true  . Ran Out of Food in the Last Year: Never true  Transportation Needs: No Transportation Needs  . Lack of Transportation (Medical): No  . Lack of Transportation (Non-Medical): No  Physical Activity: Sufficiently Active  . Days of Exercise per Week: 3 days  . Minutes of Exercise per Session: 60 min  Stress: No Stress Concern Present  . Feeling of Stress : Not at all  Social Connections:   . Frequency of Communication with Friends and Family: Not on file  . Frequency of Social Gatherings with Friends and Family: Not on file  . Attends Religious Services: Not on file  . Active Member of Clubs or Organizations: Not on file  . Attends Archivist Meetings: Not on file  . Marital Status: Not on file    Tobacco Counseling Counseling given: Not Answered   Clinical Intake:  Pre-visit preparation completed: Yes  Pain : No/denies pain     Nutritional Status: BMI 25 -29 Overweight Nutritional Risks: None Diabetes: No  How often do you need to have someone help you when you read instructions, pamphlets, or other written materials from your doctor or pharmacy?: 1 - Never What is the last grade level you completed in school?: master's degree  Diabetic? no  Interpreter Needed?: No  Information entered by :: NAllen LPN   Activities of Daily Living In your present state of health, do you have any difficulty performing the following activities: 07/15/2020  Hearing? N  Vision? N  Difficulty concentrating or making decisions? N  Walking or climbing stairs? N  Dressing or bathing? N  Doing errands, shopping? N  Preparing Food and eating ? N  Using the Toilet? N  In the past six months, have you accidently leaked  urine? Y  Comment with sneezing and coughing, wears pads sometimes  Do you have problems with loss of bowel control? N  Managing your Medications? N  Managing your Finances? N  Housekeeping or managing your Housekeeping? N  Some recent data might be hidden    Patient Care Team: [name], FNP as PCP - General (General Practice) Excell Seltzer, MD (Inactive) as Consulting Physician (General Surgery) Nicholas Lose, MD as Consulting Physician (Hematology and Oncology) Gery Pray, MD as Consulting Physician (Radiation Oncology)  Indicate any recent [place] you may have received from other than [facility] providers in the past year (date may be approximate).     Assessment:   This is a routine wellness examination for Hila.  Hearing/Vision screen  Hearing Screening   125Hz  250Hz  500Hz  1000Hz  2000Hz  3000Hz  4000Hz  6000Hz  8000Hz   Right  ear:           Left ear:           Vision Screening Comments: Regular eye exams, Dr. Griffin Dakin  Dietary issues and exercise activities discussed: Current Exercise Habits: Home exercise routine, Type of exercise: walking, Time (Minutes): 60, Frequency (Times/Week): 3, Weekly Exercise (Minutes/Week): 180  Goals    .  DIET - REDUCE SUGAR INTAKE (pt-stated)      Patient would like to cut back on snacks.    .  Exercise 150 min/wk Moderate Activity      06/19/2019, wants to start an exercise room    .  Patient Stated      07/15/2020, wants to get back to a size 10, eat healthy, cut back on pasta      Depression Screen PHQ 2/9 Scores 07/15/2020 12/23/2019 09/12/2019 06/19/2019 07/16/2018 05/23/2018 04/19/2018  PHQ - 2 Score 0 0 0 0 0 0 0  PHQ- 9 Score - - - 0 - - -    Fall Risk Fall Risk  07/15/2020 12/23/2019 09/12/2019 06/19/2019 07/16/2018  Falls in the past year? 1 0 1 0 0  Comment tripped on brick - - - -  Number falls in past yr: 0 0 0 0 0  Injury with Fall? 0 0 0 - 0  Risk for fall due to : Medication side effect - - Medication  side effect -  Follow up Falls evaluation completed;Falls prevention discussed;Education provided - - Falls evaluation completed;Education provided;Falls prevention discussed -    Any stairs in or around the home? Yes  If so, are there any without handrails? No  Home free of loose throw rugs in walkways, pet beds, electrical cords, etc? Yes  Adequate lighting in your home to reduce risk of falls? Yes   ASSISTIVE DEVICES UTILIZED TO PREVENT FALLS:  Life alert? No  Use of a cane, walker or w/c? No  Grab bars in the bathroom? No  Shower chair or bench in shower? No  Elevated toilet seat or a handicapped toilet? No   TIMED UP AND GO:  Was the test performed? No .   Gait steady and fast without use of assistive device  Cognitive Function:     6CIT Screen 07/15/2020 06/19/2019 05/23/2018  What Year? 0 points 0 points 0 points  What month? 0 points 0 points 0 points  What time? 0 points 0 points 0 points  Count back from 20 0 points 0 points 0 points  Months in reverse 0 points 2 points 2 points  Repeat phrase 2 points 6 points 0 points  Total Score 2 8 2     Immunizations Immunization History  Administered Date(s) Administered  . Moderna SARS-COVID-2 Vaccination 11/08/2019, 12/09/2019  . Pneumococcal Conjugate-13 06/23/2019  . Tdap 05/17/2013    TDAP status: Up to date Flu Vaccine status: Declined, Education has been provided regarding the importance of this vaccine but patient still declined. Advised may receive this vaccine at local pharmacy or Health Dept. Aware to provide a copy of the vaccination record if obtained from local pharmacy or Health Dept. Verbalized acceptance and understanding. Pneumococcal vaccine status: will get at a later date Covid-19 vaccine status: Completed vaccines  Qualifies for Shingles Vaccine? Yes   Zostavax completed No   Shingrix Completed?: No.    Education has been provided regarding the importance of this vaccine. Patient has been advised  to call insurance company to determine out of pocket expense if they have not yet received  this vaccine. Advised may also receive vaccine at local pharmacy or Health Dept. Verbalized acceptance and understanding.  Screening Tests Health Maintenance  Topic Date Due  . PNA vac Low Risk Adult (2 of 2 - PPSV23) 06/22/2020  . INFLUENZA VACCINE  11/19/2020 (Originally 03/22/2020)  . TETANUS/TDAP  05/18/2023  . DEXA SCAN  Completed  . COVID-19 Vaccine  Completed    Health Maintenance  Health Maintenance Due  Topic Date Due  . PNA vac Low Risk Adult (2 of 2 - PPSV23) 06/22/2020    Colorectal cancer screening: Completed 2017 . Repeat every 5 years Mammogram status: Completed 03/19/2020. Repeat every year Bone Density status: Completed 07/21/2017.   Lung Cancer Screening: (Low Dose CT Chest recommended if Age 57-80 years, 30 pack-year currently smoking OR have quit w/in 15years.) does not qualify.   Lung Cancer Screening Referral: no  Additional Screening:  Hepatitis C Screening: does not qualify;   Vision Screening: Recommended annual ophthalmology exams for early detection of glaucoma and other disorders of the eye. Is the patient up to date with their annual eye exam?  Yes  Who is the provider or what is the name of the office in which the patient attends annual eye exams? Dr. Griffin Dakin If pt is not established with a provider, would they like to be referred to a provider to establish care? No .   Dental Screening: Recommended annual dental exams for proper oral hygiene  Community Resource Referral / Chronic Care Management: CRR required this visit?  No   CCM required this visit?  No      Plan:     I have personally reviewed and noted the following in the patient's chart:   . Medical and social history . Use of alcohol, tobacco or illicit drugs  . Current medications and supplements . Functional ability and status . Nutritional status . Physical activity . Advanced  directives . List of other physicians . Hospitalizations, surgeries, and ER visits in previous 12 months . Vitals . Screenings to include cognitive, depression, and falls . Referrals and appointments  In addition, I have reviewed and discussed with patient certain preventive protocols, quality metrics, and best practice recommendations. A written personalized care plan for preventive services as well as general preventive health recommendations were provided to patient.     Kellie Simmering, LPN   30/04/2329   Nurse Notes:

## 2020-07-30 ENCOUNTER — Other Ambulatory Visit: Payer: Self-pay | Admitting: Nurse Practitioner

## 2020-08-10 ENCOUNTER — Telehealth: Payer: Self-pay

## 2020-08-10 NOTE — Telephone Encounter (Signed)
Returned pt call to schedule an appt LVM

## 2020-09-08 ENCOUNTER — Other Ambulatory Visit: Payer: Medicare PPO

## 2020-09-29 ENCOUNTER — Encounter: Payer: Medicare PPO | Admitting: Nurse Practitioner

## 2020-10-05 ENCOUNTER — Telehealth: Payer: Self-pay | Admitting: Nurse Practitioner

## 2020-10-05 NOTE — Telephone Encounter (Signed)
Left message for patient to call back and schedule Medicare Annual Wellness Visit (AWV) either virtually or in office.  Calender year humana  Can schedule any time  Last AWV 07/15/20 please schedule at anytime with [facility]    This should be a 45 minute visit.

## 2020-12-22 ENCOUNTER — Telehealth: Payer: Self-pay | Admitting: Hematology and Oncology

## 2020-12-22 ENCOUNTER — Other Ambulatory Visit: Payer: Self-pay | Admitting: Hematology and Oncology

## 2020-12-22 DIAGNOSIS — Z1231 Encounter for screening mammogram for malignant neoplasm of breast: Secondary | ICD-10-CM

## 2020-12-22 NOTE — Telephone Encounter (Signed)
Cancelled appt per 5/3 sch msg. Called pt, no answer. Left msg for pt to call back to r/s.

## 2020-12-23 ENCOUNTER — Ambulatory Visit: Payer: Medicare PPO | Admitting: Hematology and Oncology

## 2020-12-30 ENCOUNTER — Telehealth: Payer: Self-pay | Admitting: Hematology and Oncology

## 2020-12-30 NOTE — Progress Notes (Signed)
Patient Care Team: [name], FNP as PCP - General (General Practice) Excell Seltzer, MD (Inactive) as Consulting Physician (General Surgery) Nicholas Lose, MD as Consulting Physician (Hematology and Oncology) Gery Pray, MD as Consulting Physician (Radiation Oncology)  DIAGNOSIS:    ICD-10-CM   1. Malignant neoplasm of upper-inner quadrant of left breast in female, estrogen receptor positive ([place])  C50.212    Z17.0     SUMMARY OF ONCOLOGIC HISTORY: Oncology History  Malignant neoplasm of upper-inner quadrant of left breast in female, estrogen receptor positive ([place])  03/14/2018 Initial Diagnosis   Screening detected left breast mass 2.3 cm at 9:30 position biopsy revealed invasive ductal carcinoma grade 2 ER 90%, PR 5%, Ki-67 5%, HER-2 negative ratio 1.19, axilla negative, T2N0 stage Ib clinical stage   03/21/2018 Cancer Staging   Staging form: Breast, AJCC 8th Edition - Clinical stage from 03/21/2018: Stage IB (cT2, cN0, cM0, G2, ER+, PR+, HER2-) - Signed by Nicholas Lose, MD on 03/21/2018   04/06/2018 Surgery   Left lumpectomy: IDC with extravasated mucin, grade 2, 2.8 cm, intermediate grade DCIS, margins negative, ER 90%, PR 5%, HER-2 negative ratio 1.19, Ki-67 5%, T2N0 stage IA AJCC 8   04/25/2018 Cancer Staging   Staging form: Breast, AJCC 8th Edition - Pathologic: Stage IA (pT2, pN0, cM0, G2, ER+, PR+, HER2-) - Signed by Gardenia Phlegm, NP on 04/25/2018   05/16/2018 - 06/11/2018 Radiation Therapy   Adjuvant radiation therapy   07/2018 -  Anti-estrogen oral therapy   Letrozole daily      CHIEF COMPLIANT: Follow-up of left breast cancer on letrozole   INTERVAL HISTORY: Dawn Rodriguez is a 85 y.o. with above-mentioned history of left breast cancer treated with lumpectomy, radiation, and who is currently on antiestrogen therapy with letrozole. Mammogram on 03/12/20 showed a 1.5cm area of calcifications in the right breast. Biopsy on 03/19/20 showed no  evidence of malignancy. She presents to the clinic today for follow-up.   She has intermittent breast pain but otherwise doing quite well.  She does have hot flashes from letrozole.  Some joint stiffness but she is able to manage them.  ALLERGIES:  is allergic to tobramycin.  MEDICATIONS:  Current Outpatient Medications  Medication Sig Dispense Refill  . amLODipine (NORVASC) 2.5 MG tablet TAKE 1 TABLET EVERY DAY 90 tablet 1  . Calcium Carb-Cholecalciferol (CALCIUM-VITAMIN D3) 600-400 MG-UNIT TABS Take by mouth 2 (two) times daily.    . calcium carbonate (TUMS - DOSED IN MG ELEMENTAL CALCIUM) 500 MG chewable tablet Chew 1 tablet by mouth daily as needed for indigestion or heartburn.    . Garlic 7829 MG CAPS Take 1,000 mg by mouth daily.     . Glycerin-Polysorbate 80 (REFRESH DRY EYE THERAPY OP) Place 1-2 drops into both eyes 4 (four) times daily as needed (for dry eyes).     Marland Kitchen ipratropium (ATROVENT) 0.06 % nasal spray PLACE 2 SPRAYS IN EACH NOSTRIL 4 TIMES DAILY (Patient not taking: Reported on 07/15/2020) 15 mL 1  . KRILL OIL OMEGA-3 PO Take 1 capsule by mouth daily.    Marland Kitchen letrozole (FEMARA) 2.5 MG tablet TAKE 1 TABLET BY MOUTH EVERY DAY 90 tablet 3  . Magnesium 250 MG TABS Take 250 mg by mouth daily.     . mometasone (ELOCON) 0.1 % cream APPLY TO AFFECTED AREA DAILY 45 g 1  . omeprazole (PRILOSEC) 20 MG capsule TAKE 1 CAPSULE EVERY DAY 90 capsule 1  . potassium gluconate 595 MG TABS Take 595  mg by mouth daily.    . traMADol (ULTRAM) 50 MG tablet Take 1 tablet (50 mg total) by mouth every 6 (six) hours as needed. (Patient not taking: Reported on 07/15/2020) 15 tablet 0  . vitamin B-12 (CYANOCOBALAMIN) 1000 MCG tablet Take 1,000 mcg by mouth daily. (Patient not taking: Reported on 07/15/2020)     No current facility-administered medications for this visit.    PHYSICAL EXAMINATION: ECOG PERFORMANCE STATUS: 1 - Symptomatic but completely ambulatory  Vitals:   12/31/20 0953  BP: (!) 173/73   Pulse: 63  Resp: 15  Temp: 97.9 F (36.6 C)  SpO2: 99%   Filed Weights   12/31/20 0953  Weight: 161 lb 8 oz (73.3 kg)    BREAST: No palpable masses or nodules in either right or left breasts. No palpable axillary supraclavicular or infraclavicular adenopathy no breast tenderness or nipple discharge. (exam performed in the presence of a chaperone)  LABORATORY DATA:  I have reviewed the data as listed CMP Latest Ref Rng & Units 03/25/2020 12/23/2019 10/29/2018  Glucose 65 - 99 mg/dL 83 106(H) 94  BUN 8 - 27 mg/dL _0 Creatinine 0.57 - 1.00 mg/dL 0.81 0.76 0.78  Sodium 134 - 144 mmol/L 144 141 143  Potassium 3.5 - 5.2 mmol/L 4.2 3.7 3.9  Chloride 96 - 106 mmol/L 105 103 102  CO2 20 - 29 mmol/L _1 Calcium 8.7 - 10.3 mg/dL 9.6 9.5 9.6  Total Protein 6.0 - 8.5 g/dL - - 7.0  Total Bilirubin 0.0 - 1.2 mg/dL - - 0.8  Alkaline Phos 39 - 117 IU/L - - 74  AST 0 - 40 IU/L - - 14  ALT 0 - 32 IU/L - - 10    Lab Results  Component Value Date   WBC 4.0 03/25/2020   HGB 12.2 03/25/2020   HCT 38.5 03/25/2020   MCV 87 03/25/2020   PLT 258 03/25/2020   NEUTROABS 1.5 03/21/2018    ASSESSMENT & PLAN:  Malignant neoplasm of upper-inner quadrant of left breast in female, estrogen receptor positive ([facility]) 04/06/2018:Left lumpectomy: IDC with extravasated mucin, grade 2, 2.8 cm, intermediate grade DCIS, margins negative, ER 90%, PR 5%, HER-2 negative ratio 1.19, Ki-67 5%, T2N0 stage IA AJCC 8 Adjuvant radiation therapy 05/16/2018 to 06/11/2018  Treatment plan: Letrozole 2.5 mg daily started 07/16/2018  Letrozole toxicities:Denies any hot flashes or arthralgias or myalgias. She has trouble with insomnia.  She is experimenting with taking the medication at different times of the day.  Osteoporosis: T score -2.9: on Fosamax. She takes calcium and vitamin D. Right breast discomfort:  03/12/2020: Mammogram and ultrasound: Indeterminate calcifications 1.5 cm right breast: Biopsy:  Fibroadenomatoid change.  Breast cancer surveillance: 1.  Mammogram scheduled for June 2.  Breast exam 12/31/2020: Benign  Return to clinic in 1 year for follow-up.    No orders of the defined types were placed in this encounter.  The patient has a good understanding of the overall plan. she agrees with it. she will call with any problems that may develop before the next visit here.  Total time spent: 20 mins including face to face time and time spent for planning, charting and coordination of care  Rulon Eisenmenger, MD, MPH 12/31/2020  I, Molly Dorshimer, am acting as scribe for Dr. Nicholas Lose.  I have reviewed the above documentation for accuracy and completeness, and I agree with the above.

## 2020-12-30 NOTE — Telephone Encounter (Signed)
Pt called in to r/s appt from 5/4. Pt is aware of new appt date and time.

## 2020-12-31 ENCOUNTER — Other Ambulatory Visit: Payer: Self-pay

## 2020-12-31 ENCOUNTER — Inpatient Hospital Stay: Payer: Medicare PPO | Attending: Hematology and Oncology | Admitting: Hematology and Oncology

## 2020-12-31 DIAGNOSIS — Z79811 Long term (current) use of aromatase inhibitors: Secondary | ICD-10-CM | POA: Insufficient documentation

## 2020-12-31 DIAGNOSIS — Z79899 Other long term (current) drug therapy: Secondary | ICD-10-CM | POA: Insufficient documentation

## 2020-12-31 DIAGNOSIS — C50212 Malignant neoplasm of upper-inner quadrant of left female breast: Secondary | ICD-10-CM | POA: Insufficient documentation

## 2020-12-31 DIAGNOSIS — Z170 Estrogen receptor positive status [ER+]: Secondary | ICD-10-CM | POA: Diagnosis not present

## 2020-12-31 DIAGNOSIS — Z7951 Long term (current) use of inhaled steroids: Secondary | ICD-10-CM | POA: Diagnosis not present

## 2020-12-31 MED ORDER — CYCLOSPORINE 0.05 % OP EMUL
1.0000 [drp] | Freq: Two times a day (BID) | OPHTHALMIC | Status: DC
Start: 2020-12-31 — End: 2023-11-22

## 2020-12-31 MED ORDER — LETROZOLE 2.5 MG PO TABS: 2.5000 mg | ORAL_TABLET | Freq: Every day | ORAL | 3 refills | Status: DC

## 2020-12-31 MED ORDER — OLOPATADINE HCL 0.1 % OP SOLN: 1.0000 [drp] | Freq: Two times a day (BID) | OPHTHALMIC | 12 refills | Status: AC

## 2020-12-31 NOTE — Assessment & Plan Note (Signed)
04/06/2018:Left lumpectomy: IDC with extravasated mucin, grade 2, 2.8 cm, intermediate grade DCIS, margins negative, ER 90%, PR 5%, HER-2 negative ratio 1.19, Ki-67 5%, T2N0 stage IA AJCC 8 Adjuvant radiation therapy 05/16/2018 to 06/11/2018  Treatment plan: Letrozole 2.5 mg daily started 07/16/2018  Letrozole toxicities:Denies any hot flashes or arthralgias or myalgias. She has trouble with insomnia.  She is experimenting with taking the medication at different times of the day.  Osteoporosis: T score -2.9: on Fosamax. She takes calcium and vitamin D. Right breast discomfort:  03/12/2020: Mammogram and ultrasound: Indeterminate calcifications 1.5 cm right breast: Biopsy: Fibroadenomatoid change.  Breast cancer surveillance: 1.  Mammogram scheduled for June 2.  Breast exam 12/31/2020: Benign  Return to clinic in 1 year for follow-up.

## 2021-01-12 ENCOUNTER — Other Ambulatory Visit: Payer: Self-pay

## 2021-01-12 ENCOUNTER — Ambulatory Visit: Payer: Medicare PPO | Admitting: Nurse Practitioner

## 2021-01-12 ENCOUNTER — Encounter: Payer: Self-pay | Admitting: Nurse Practitioner

## 2021-01-12 VITALS — BP 132/78 | HR 64 | Temp 98.2°F | Ht 64.2 in | Wt 157.2 lb

## 2021-01-12 DIAGNOSIS — Z23 Encounter for immunization: Secondary | ICD-10-CM

## 2021-01-12 DIAGNOSIS — M79676 Pain in unspecified toe(s): Secondary | ICD-10-CM | POA: Diagnosis not present

## 2021-01-12 DIAGNOSIS — F419 Anxiety disorder, unspecified: Secondary | ICD-10-CM | POA: Diagnosis not present

## 2021-01-12 DIAGNOSIS — B351 Tinea unguium: Secondary | ICD-10-CM

## 2021-01-12 DIAGNOSIS — I10 Essential (primary) hypertension: Secondary | ICD-10-CM | POA: Diagnosis not present

## 2021-01-12 DIAGNOSIS — H9203 Otalgia, bilateral: Secondary | ICD-10-CM | POA: Diagnosis not present

## 2021-01-12 MED ORDER — CICLOPIROX 8 % EX SOLN: Freq: Every day | CUTANEOUS | 0 refills | Status: DC

## 2021-01-12 MED ORDER — ALPRAZOLAM 0.25 MG PO TABS: 0.2500 mg | ORAL_TABLET | Freq: Every evening | ORAL | 0 refills | Status: DC | PRN

## 2021-01-12 NOTE — Progress Notes (Signed)
I,Tianna Badgett,acting as a Education administrator for [employer], FNP.,have documented all relevant documentation on the behalf of [name], FNP,as directed by  [name], FNP while in the presence of [name], [place].  This visit occurred during the SARS-CoV-2 public health emergency.  Safety protocols were in place, including screening questions prior to the visit, additional usage of staff PPE, and extensive cleaning of exam room while observing appropriate contact time as indicated for disinfecting solutions.  Subjective:     Patient ID: Dawn Rodriguez , female    DOB: 1936-05-22 , 85 y.o.   MRN: [id number]   Chief Complaint  Patient presents with  . Nail Problem    HPI  Patient presents today for toenail concerns. She stated the color of her nail on both nails have changed and they are a little bit sore.  She has been using antifungal medication. She does cut her own toenails.  Has been ongoing for a month.     She has seen the Oncologist 3 weeks ago, everything looks good according to the patient. When she changed the time of taking her chemo medication between 6-8, she is now getting less itching and better sleep    Past Medical History:  Diagnosis Date  . Arthritis   . Bilateral dry eyes   . Cancer ([place]) 02/2018   left breast cancer  . Cataracts, bilateral   . Chronic headache   . GERD (gastroesophageal reflux disease)   . Hypertension   . Personal history of radiation therapy   . TMJ syndrome      Family History  Problem Relation Age of Onset  . Pneumonia Mother      Current Outpatient Medications:  .  amLODipine (NORVASC) 2.5 MG tablet, TAKE 1 TABLET EVERY DAY, Disp: 90 tablet, Rfl: 1 .  Calcium Carb-Cholecalciferol (CALCIUM-VITAMIN D3) 600-400 MG-UNIT TABS, Take by mouth 2 (two) times daily., Disp: , Rfl:  .  ciclopirox (PENLAC) 8 % solution, Apply topically at bedtime. Apply over nail and surrounding skin. Apply daily over previous coat. After seven (7)  days, may remove with alcohol and continue cycle., Disp: 6.6 mL, Rfl: 0 .  cycloSPORINE (RESTASIS) 0.05 % ophthalmic emulsion, Place 1 drop into both eyes 2 (two) times daily., Disp: 0.4 mL, Rfl:  .  Garlic 3716 MG CAPS, Take 1,000 mg by mouth daily. , Disp: , Rfl:  .  Glycerin-Polysorbate 80 (REFRESH DRY EYE THERAPY OP), Place 1-2 drops into both eyes 4 (four) times daily as needed (for dry eyes). , Disp: , Rfl:  .  KRILL OIL OMEGA-3 PO, Take 1 capsule by mouth daily., Disp: , Rfl:  .  letrozole (FEMARA) 2.5 MG tablet, Take 1 tablet (2.5 mg total) by mouth daily., Disp: 90 tablet, Rfl: 3 .  Magnesium 250 MG TABS, Take 250 mg by mouth daily. , Disp: , Rfl:  .  mometasone (ELOCON) 0.1 % cream, APPLY TO AFFECTED AREA DAILY, Disp: 45 g, Rfl: 1 .  olopatadine (PATADAY) 0.1 % ophthalmic solution, Place 1 drop into both eyes 2 (two) times daily., Disp: 5 mL, Rfl: 12 .  omeprazole (PRILOSEC) 20 MG capsule, TAKE 1 CAPSULE EVERY DAY, Disp: 90 capsule, Rfl: 1 .  potassium gluconate 595 MG TABS, Take 595 mg by mouth daily., Disp: , Rfl:  .  ipratropium (ATROVENT) 0.06 % nasal spray, PLACE 2 SPRAYS IN EACH NOSTRIL 4 TIMES DAILY (Patient not taking: No sig reported), Disp: 15 mL, Rfl: 1 .  vitamin B-12 (CYANOCOBALAMIN) 1000  MCG tablet, Take 1,000 mcg by mouth daily. (Patient not taking: No sig reported), Disp: , Rfl:    Allergies  Allergen Reactions  . Tobramycin Itching, Swelling and Rash    Tobramycin eye drops caused swelling, itching, drainage, rash and peeling of the skin     Review of Systems  Constitutional: Negative for fatigue.  HENT:       Bilateral ear discomfort  Respiratory: Negative.   Cardiovascular: Negative.  Negative for chest pain, palpitations and leg swelling.  Genitourinary: Negative for vaginal discharge.  Skin: Positive for color change (big toenails).  Neurological: Negative for dizziness and headaches.  Psychiatric/Behavioral: Negative.      Today's Vitals   01/12/21  1051  BP: 132/78  Pulse: 64  Temp: 98.2 F (36.8 C)  Weight: 157 lb 3.2 oz (71.3 kg)  Height: 5' 4.2" (1.631 m)  PainSc: 2   PainLoc: Toe   Body mass index is 26.82 kg/m.   Objective:  Physical Exam Vitals reviewed.  Constitutional:      General: She is not in acute distress.    Appearance: Normal appearance. She is well-developed.  HENT:     Right Ear: Tympanic membrane, ear canal and external ear normal. There is no impacted cerumen.     Left Ear: Tympanic membrane, ear canal and external ear normal. There is no impacted cerumen.  Eyes:     Extraocular Movements: Extraocular movements intact.     Conjunctiva/sclera: Conjunctivae normal.     Pupils: Pupils are equal, round, and reactive to light.  Cardiovascular:     Rate and Rhythm: Normal rate and regular rhythm.     Pulses: Normal pulses.     Heart sounds: Normal heart sounds. No murmur heard.   Pulmonary:     Effort: Pulmonary effort is normal. No respiratory distress.     Breath sounds: Normal breath sounds. No wheezing.  Musculoskeletal:        General: Normal range of motion.  Skin:    General: Skin is warm and dry.     Capillary Refill: Capillary refill takes less than 2 seconds.     Comments: Thickened toenails bilateral, left great toe nail is cut down closer to skin.   Neurological:     General: No focal deficit present.     Mental Status: She is alert and oriented to person, place, and time.     Cranial Nerves: No cranial nerve deficit.     Motor: No weakness.  Psychiatric:        Mood and Affect: Mood normal.        Behavior: Behavior normal.        Thought Content: Thought content normal.        Judgment: Judgment normal.         Assessment And Plan:     1. Essential hypertension  Chronic, fair control  Continue with current medications  Will check labs at next visit.   2. Pain due to onychomycosis of toenail  She has thickened great toe nails,  No redness noted  Advised to avoid  trying to cut her nails due to the thickness and would need to have done with a podiatrist.  - ciclopirox (PENLAC) 8 % solution; Apply topically at bedtime. Apply over nail and surrounding skin. Apply daily over previous coat. After seven (7) days, may remove with alcohol and continue cycle.  Dispense: 6.6 mL; Refill: 0  3. Discomfort of both ears  No abnormal findings on physical examination  4. Encounter for immunization - Pneumococcal polysaccharide vaccine 23-valent greater than or equal to 2yo subcutaneous/IM  5. Anxiety  She is feeling overwhelmed at times.   I will prescribe her lorazepam this time for limited use.     Patient was given opportunity to ask questions. Patient verbalized understanding of the plan and was able to repeat key elements of the plan. All questions were answered to their satisfaction.  [name], FNP   I, [name], FNP, have reviewed all documentation for this visit. The documentation on 01/12/21 for the exam, diagnosis, procedures, and orders are all accurate and complete.   IF YOU HAVE BEEN REFERRED TO A SPECIALIST, IT MAY TAKE 1-2 WEEKS TO SCHEDULE/PROCESS THE REFERRAL. IF YOU HAVE NOT HEARD FROM US/SPECIALIST IN TWO WEEKS, PLEASE GIVE [place] A CALL AT [phone].   THE PATIENT IS ENCOURAGED TO PRACTICE SOCIAL DISTANCING DUE TO THE COVID-19 PANDEMIC.

## 2021-01-22 ENCOUNTER — Other Ambulatory Visit: Payer: Self-pay | Admitting: Hematology and Oncology

## 2021-01-22 DIAGNOSIS — Z853 Personal history of malignant neoplasm of breast: Secondary | ICD-10-CM

## 2021-01-27 ENCOUNTER — Ambulatory Visit
Admission: RE | Admit: 2021-01-27 | Discharge: 2021-01-27 | Disposition: A | Discharge status: Home / Self Care | Payer: Medicare PPO | Source: Ambulatory Visit | Attending: Hematology and Oncology | Admitting: Hematology and Oncology

## 2021-01-27 ENCOUNTER — Other Ambulatory Visit: Payer: Self-pay

## 2021-01-27 DIAGNOSIS — Z853 Personal history of malignant neoplasm of breast: Secondary | ICD-10-CM | POA: Diagnosis not present

## 2021-01-27 DIAGNOSIS — R922 Inconclusive mammogram: Secondary | ICD-10-CM | POA: Diagnosis not present

## 2021-02-16 ENCOUNTER — Ambulatory Visit (INDEPENDENT_AMBULATORY_CARE_PROVIDER_SITE_OTHER): Payer: Medicare PPO | Admitting: Nurse Practitioner

## 2021-02-16 ENCOUNTER — Encounter: Payer: Self-pay | Admitting: Nurse Practitioner

## 2021-02-16 ENCOUNTER — Other Ambulatory Visit: Payer: Self-pay

## 2021-02-16 VITALS — BP 122/68 | HR 87 | Temp 98.7°F | Ht 64.2 in | Wt 158.0 lb

## 2021-02-16 DIAGNOSIS — K219 Gastro-esophageal reflux disease without esophagitis: Secondary | ICD-10-CM

## 2021-02-16 DIAGNOSIS — I10 Essential (primary) hypertension: Secondary | ICD-10-CM

## 2021-02-16 DIAGNOSIS — Z0000 Encounter for general adult medical examination without abnormal findings: Secondary | ICD-10-CM

## 2021-02-16 DIAGNOSIS — Z23 Encounter for immunization: Secondary | ICD-10-CM | POA: Diagnosis not present

## 2021-02-16 DIAGNOSIS — R7309 Other abnormal glucose: Secondary | ICD-10-CM

## 2021-02-16 MED ORDER — SHINGRIX 50 MCG/0.5ML IM SUSR: 0.5000 mL | Freq: Once | INTRAMUSCULAR | 0 refills | Status: AC

## 2021-02-16 NOTE — Patient Instructions (Signed)
Health Maintenance, Female Adopting a healthy lifestyle and getting preventive care are important in promoting health and wellness. Ask your health care provider about: The right schedule for you to have regular tests and exams. Things you can do on your own to prevent diseases and keep yourself healthy. What should I know about diet, weight, and exercise? Eat a healthy diet  Eat a diet that includes plenty of vegetables, fruits, low-fat dairy products, and lean protein. Do not eat a lot of foods that are high in solid fats, added sugars, or sodium.  Maintain a healthy weight Body mass index (BMI) is used to identify weight problems. It estimates body fat based on height and weight. Your health care provider can help determineyour BMI and help you achieve or maintain a healthy weight. Get regular exercise Get regular exercise. This is one of the most important things you can do for your health. Most adults should: Exercise for at least 150 minutes each week. The exercise should increase your heart rate and make you sweat (moderate-intensity exercise). Do strengthening exercises at least twice a week. This is in addition to the moderate-intensity exercise. Spend less time sitting. Even light physical activity can be beneficial. Watch cholesterol and blood lipids Have your blood tested for lipids and cholesterol at 83 years of age, then havethis test every 5 years. Have your cholesterol levels checked more often if: Your lipid or cholesterol levels are high. You are older than 83 years of age. You are at high risk for heart disease. What should I know about cancer screening? Depending on your health history and family history, you may need to have cancer screening at various ages. This may include screening for: Breast cancer. Cervical cancer. Colorectal cancer. Skin cancer. Lung cancer. What should I know about heart disease, diabetes, and high blood pressure? Blood pressure and heart  disease High blood pressure causes heart disease and increases the risk of stroke. This is more likely to develop in people who have high blood pressure readings, are of African descent, or are overweight. Have your blood pressure checked: Every 3-5 years if you are 18-39 years of age. Every year if you are 40 years old or older. Diabetes Have regular diabetes screenings. This checks your fasting blood sugar level. Have the screening done: Once every three years after age 40 if you are at a normal weight and have a low risk for diabetes. More often and at a younger age if you are overweight or have a high risk for diabetes. What should I know about preventing infection? Hepatitis B If you have a higher risk for hepatitis B, you should be screened for this virus. Talk with your health care provider to find out if you are at risk forhepatitis B infection. Hepatitis C Testing is recommended for: Everyone born from 1945 through 1965. Anyone with known risk factors for hepatitis C. Sexually transmitted infections (STIs) Get screened for STIs, including gonorrhea and chlamydia, if: You are sexually active and are younger than 83 years of age. You are older than 83 years of age and your health care provider tells you that you are at risk for this type of infection. Your sexual activity has changed since you were last screened, and you are at increased risk for chlamydia or gonorrhea. Ask your health care provider if you are at risk. Ask your health care provider about whether you are at high risk for HIV. Your health care provider may recommend a prescription medicine to help   prevent HIV infection. If you choose to take medicine to prevent HIV, you should first get tested for HIV. You should then be tested every 3 months for as long as you are taking the medicine. Pregnancy If you are about to stop having your period (premenopausal) and you may become pregnant, seek counseling before you get  pregnant. Take 400 to 800 micrograms (mcg) of folic acid every day if you become pregnant. Ask for birth control (contraception) if you want to prevent pregnancy. Osteoporosis and menopause Osteoporosis is a disease in which the bones lose minerals and strength with aging. This can result in bone fractures. If you are 65 years old or older, or if you are at risk for osteoporosis and fractures, ask your health care provider if you should: Be screened for bone loss. Take a calcium or vitamin D supplement to lower your risk of fractures. Be given hormone replacement therapy (HRT) to treat symptoms of menopause. Follow these instructions at home: Lifestyle Do not use any products that contain nicotine or tobacco, such as cigarettes, e-cigarettes, and chewing tobacco. If you need help quitting, ask your health care provider. Do not use street drugs. Do not share needles. Ask your health care provider for help if you need support or information about quitting drugs. Alcohol use Do not drink alcohol if: Your health care provider tells you not to drink. You are pregnant, may be pregnant, or are planning to become pregnant. If you drink alcohol: Limit how much you use to 0-1 drink a day. Limit intake if you are breastfeeding. Be aware of how much alcohol is in your drink. In the [place] one drink equals one 12 oz bottle of beer (355 mL), one 5 oz glass of wine (148 mL), or one 1 oz glass of hard liquor (44 mL). General instructions Schedule regular health, dental, and eye exams. Stay current with your vaccines. Tell your health care provider if: You often feel depressed. You have ever been abused or do not feel safe at home. Summary Adopting a healthy lifestyle and getting preventive care are important in promoting health and wellness. Follow your health care provider's instructions about healthy diet, exercising, and getting tested or screened for diseases. Follow your health care provider's  instructions on monitoring your cholesterol and blood pressure. This information is not intended to replace advice given to you by your health care provider. Make sure you discuss any questions you have with your healthcare provider. Document Revised: 08/01/2018 Document Reviewed: 08/01/2018 Elsevier Patient Education  2022 [employer] Inc.  

## 2021-02-16 NOTE — Progress Notes (Signed)
I,Tianna Badgett,acting as a Education administrator for [employer], FNP.,have documented all relevant documentation on the behalf of [name], FNP,as directed by  [name], FNP while in the presence of [name], [place].  This visit occurred during the SARS-CoV-2 public health emergency.  Safety protocols were in place, including screening questions prior to the visit, additional usage of staff PPE, and extensive cleaning of exam room while observing appropriate contact time as indicated for disinfecting solutions.  Subjective:     Patient ID: Dawn Rodriguez , female    DOB: Oct 23, 1935 , 85 y.o.   MRN: [id number]   Chief Complaint  Patient presents with   Annual Exam    HPI  Here for HM    Past Medical History:  Diagnosis Date   Arthritis    Bilateral dry eyes    Cancer ([place]) 02/2018   left breast cancer   Cataracts, bilateral    Chronic headache    GERD (gastroesophageal reflux disease)    Hypertension    Personal history of radiation therapy    TMJ syndrome      Family History  Problem Relation Age of Onset   Pneumonia Mother      Current Outpatient Medications:    ALPRAZolam (XANAX) 0.25 MG tablet, Take 1 tablet (0.25 mg total) by mouth at bedtime as needed for anxiety., Disp: 30 tablet, Rfl: 0   amLODipine (NORVASC) 2.5 MG tablet, TAKE 1 TABLET EVERY DAY, Disp: 90 tablet, Rfl: 1   Calcium Carb-Cholecalciferol (CALCIUM-VITAMIN D3) 600-400 MG-UNIT TABS, Take by mouth 2 (two) times daily., Disp: , Rfl:    ciclopirox (PENLAC) 8 % solution, Apply topically at bedtime. Apply over nail and surrounding skin. Apply daily over previous coat. After seven (7) days, may remove with alcohol and continue cycle., Disp: 6.6 mL, Rfl: 0   cycloSPORINE (RESTASIS) 0.05 % ophthalmic emulsion, Place 1 drop into both eyes 2 (two) times daily., Disp: 0.4 mL, Rfl:    Garlic 2334 MG CAPS, Take 1,000 mg by mouth daily. , Disp: , Rfl:    Glycerin-Polysorbate 80 (REFRESH DRY EYE THERAPY OP),  Place 1-2 drops into both eyes 4 (four) times daily as needed (for dry eyes). , Disp: , Rfl:    KRILL OIL OMEGA-3 PO, Take 1 capsule by mouth daily., Disp: , Rfl:    letrozole (FEMARA) 2.5 MG tablet, Take 1 tablet (2.5 mg total) by mouth daily., Disp: 90 tablet, Rfl: 3   Magnesium 250 MG TABS, Take 250 mg by mouth daily. , Disp: , Rfl:    mometasone (ELOCON) 0.1 % cream, APPLY TO AFFECTED AREA DAILY, Disp: 45 g, Rfl: 1   olopatadine (PATADAY) 0.1 % ophthalmic solution, Place 1 drop into both eyes 2 (two) times daily., Disp: 5 mL, Rfl: 12   omeprazole (PRILOSEC) 20 MG capsule, TAKE 1 CAPSULE EVERY DAY, Disp: 90 capsule, Rfl: 1   potassium gluconate 595 MG TABS, Take 595 mg by mouth daily., Disp: , Rfl:    ipratropium (ATROVENT) 0.06 % nasal spray, PLACE 2 SPRAYS IN EACH NOSTRIL 4 TIMES DAILY (Patient not taking: No sig reported), Disp: 15 mL, Rfl: 1   vitamin B-12 (CYANOCOBALAMIN) 1000 MCG tablet, Take 1,000 mcg by mouth daily. (Patient not taking: No sig reported), Disp: , Rfl:    Allergies  Allergen Reactions   Tobramycin Itching, Swelling and Rash    Tobramycin eye drops caused swelling, itching, drainage, rash and peeling of the skin      The patient states she  uses post menopausal status for birth control. No LMP recorded. Patient is postmenopausal.. Negative for Dysmenorrhea and Negative for Menorrhagia. Negative for: breast discharge, breast lump(s), breast pain and breast self exam. Associated symptoms include abnormal vaginal bleeding. Pertinent negatives include abnormal bleeding (hematology), anxiety, decreased libido, depression, difficulty falling sleep, dyspareunia, history of infertility, nocturia, sexual dysfunction, sleep disturbances, urinary incontinence, urinary urgency, vaginal discharge and vaginal itching. Diet regular. The patient states her exercise level is moderate - she walks 30 minutes a day  The patient's tobacco use is:  Social History   Tobacco Use  Smoking  Status Never  Smokeless Tobacco Never   She has been exposed to passive smoke. The patient's alcohol use is:  Social History   Substance and Sexual Activity  Alcohol Use No    Review of Systems  Constitutional: Negative.   HENT: Negative.    Eyes: Negative.   Respiratory: Negative.    Cardiovascular: Negative.  Negative for chest pain, palpitations and leg swelling.  Gastrointestinal: Negative.   Endocrine: Negative.   Genitourinary: Negative.   Musculoskeletal: Negative.   Skin: Negative.   Allergic/Immunologic: Negative.   Neurological: Negative.   Hematological: Negative.   Psychiatric/Behavioral: Negative.      Today's Vitals   02/16/21 1046  BP: 122/68  Pulse: 87  Temp: 98.7 F (37.1 C)  Weight: 158 lb (71.7 kg)  Height: 5' 4.2" (1.631 m)  PainSc: 0-No pain   Body mass index is 26.95 kg/m.   Objective:  Physical Exam Vitals reviewed.  Constitutional:      General: She is not in acute distress.    Appearance: Normal appearance. She is well-developed.  HENT:     Head: Normocephalic and atraumatic.     Right Ear: Hearing normal.     Left Ear: Hearing normal.     Nose:     Comments: Deferred - masked     Mouth/Throat:     Comments: Deferred - masked Eyes:     General: Lids are normal.     Extraocular Movements: Extraocular movements intact.     Conjunctiva/sclera: Conjunctivae normal.     Pupils: Pupils are equal, round, and reactive to light.     Funduscopic exam:    Right eye: No papilledema.        Left eye: No papilledema.  Neck:     Thyroid: No thyroid mass.     Vascular: No carotid bruit.  Cardiovascular:     Rate and Rhythm: Normal rate and regular rhythm.     Pulses: Normal pulses.     Heart sounds: Normal heart sounds. No murmur heard. Pulmonary:     Effort: Pulmonary effort is normal. No respiratory distress.     Breath sounds: Normal breath sounds. No wheezing.  Abdominal:     General: Abdomen is flat. Bowel sounds are normal. There  is no distension.     Palpations: Abdomen is soft.     Tenderness: There is no abdominal tenderness.  Musculoskeletal:        General: No swelling or tenderness. Normal range of motion.     Cervical back: Full passive range of motion without pain, normal range of motion and neck supple.     Right lower leg: No edema.     Left lower leg: No edema.  Skin:    General: Skin is warm and dry.     Capillary Refill: Capillary refill takes less than 2 seconds.  Neurological:     General: No focal deficit  present.     Mental Status: She is alert and oriented to person, place, and time.     Cranial Nerves: No cranial nerve deficit.     Sensory: No sensory deficit.  Psychiatric:        Mood and Affect: Mood normal.        Behavior: Behavior normal.        Thought Content: Thought content normal.        Judgment: Judgment normal.        Assessment And Plan:     1. Health maintenance examination Behavior modifications discussed and diet history reviewed.   Pt will continue to exercise regularly and modify diet with low GI, plant based foods and decrease intake of processed foods.  Recommend intake of daily multivitamin, Vitamin D, and calcium.  Recommend mammogram and colonoscopy for preventive screenings, as well as recommend immunizations that include influenza, TDAP, and Shingles (Rx sent to pharmacy)  2. Essential hypertension Chronic, blood pressure is well controlled Continue with current medications - POCT Urinalysis Dipstick (81002) - POCT UA - Microalbumin - EKG 12-Lead - CMP14+EGFR - Lipid panel  3. Gastroesophageal reflux disease without esophagitis Stable, continue with omeprazole  4. Abnormal glucose Stable, continue with healthy diet low in sugar - Hemoglobin A1c - Lipid panel - CBC  5. Encounter for immunization - Zoster Vaccine Adjuvanted [facility]) injection; Inject 0.5 mLs into the muscle once for 1 dose.  Dispense: 0.5 mL; Refill: 0     Patient was given  opportunity to ask questions. Patient verbalized understanding of the plan and was able to repeat key elements of the plan. All questions were answered to their satisfaction.   [name], FNP   I, [name], FNP, have reviewed all documentation for this visit. The documentation on 02/16/21 for the exam, diagnosis, procedures, and orders are all accurate and complete.   THE PATIENT IS ENCOURAGED TO PRACTICE SOCIAL DISTANCING DUE TO THE COVID-19 PANDEMIC.

## 2021-03-02 DIAGNOSIS — I10 Essential (primary) hypertension: Secondary | ICD-10-CM | POA: Diagnosis not present

## 2021-03-02 DIAGNOSIS — R7309 Other abnormal glucose: Secondary | ICD-10-CM | POA: Diagnosis not present

## 2021-03-03 LAB — HEMOGLOBIN A1C
Est. average glucose Bld gHb Est-mCnc: 120 mg/dL
Hgb A1c MFr Bld: 5.8 % — ABNORMAL HIGH (ref 4.8–5.6)

## 2021-03-03 LAB — CMP14+EGFR
ALT: 12 IU/L (ref 0–32)
AST: 14 IU/L (ref 0–40)
Albumin/Globulin Ratio: 1.8 (ref 1.2–2.2)
Albumin: 4.4 g/dL (ref 3.6–4.6)
Alkaline Phosphatase: 72 IU/L (ref 44–121)
BUN/Creatinine Ratio: 15 (ref 12–28)
BUN: 10 mg/dL (ref 8–27)
Bilirubin Total: 0.6 mg/dL (ref 0.0–1.2)
CO2: 26 mmol/L (ref 20–29)
Calcium: 9.4 mg/dL (ref 8.7–10.3)
Chloride: 104 mmol/L (ref 96–106)
Creatinine, Ser: 0.68 mg/dL (ref 0.57–1.00)
Globulin, Total: 2.4 g/dL (ref 1.5–4.5)
Glucose: 90 mg/dL (ref 65–99)
Potassium: 4.1 mmol/L (ref 3.5–5.2)
Sodium: 143 mmol/L (ref 134–144)
Total Protein: 6.8 g/dL (ref 6.0–8.5)
eGFR: 86 mL/min/{1.73_m2} (ref >59)

## 2021-03-03 LAB — CBC
Hematocrit: 35.2 % (ref 34.0–46.6)
Hemoglobin: 11.6 g/dL (ref 11.1–15.9)
MCH: 28.6 pg (ref 26.6–33.0)
MCHC: 33 g/dL (ref 31.5–35.7)
MCV: 87 fL (ref 79–97)
Platelets: 238 10*3/uL (ref 150–450)
RBC: 4.05 x10E6/uL (ref 3.77–5.28)
RDW: 14 % (ref 11.7–15.4)
WBC: 2.8 10*3/uL — ABNORMAL LOW (ref 3.4–10.8)

## 2021-03-03 LAB — LIPID PANEL
Chol/HDL Ratio: 3.3 ratio (ref 0.0–4.4)
Cholesterol, Total: 207 mg/dL — ABNORMAL HIGH (ref 100–199)
HDL: 62 mg/dL (ref >39)
LDL Chol Calc (NIH): 137 mg/dL — ABNORMAL HIGH (ref 0–99)
Triglycerides: 45 mg/dL (ref 0–149)
VLDL Cholesterol Cal: 8 mg/dL (ref 5–40)

## 2021-03-17 ENCOUNTER — Encounter: Payer: Self-pay | Admitting: Nurse Practitioner

## 2021-04-08 ENCOUNTER — Ambulatory Visit: Payer: Medicare PPO | Admitting: Nurse Practitioner

## 2021-04-08 ENCOUNTER — Encounter: Payer: Self-pay | Admitting: Nurse Practitioner

## 2021-04-08 ENCOUNTER — Other Ambulatory Visit: Payer: Self-pay

## 2021-04-08 VITALS — BP 124/68 | HR 69 | Temp 98.3°F | Ht 64.2 in | Wt 155.0 lb

## 2021-04-08 DIAGNOSIS — R5383 Other fatigue: Secondary | ICD-10-CM | POA: Diagnosis not present

## 2021-04-08 NOTE — Progress Notes (Signed)
I,Katawbba Wiggins,acting as a Education administrator for [employer], FNP.,have documented all relevant documentation on the behalf of [name], FNP,as directed by  [name], FNP while in the presence of [name], [place].   This visit occurred during the SARS-CoV-2 public health emergency.  Safety protocols were in place, including screening questions prior to the visit, additional usage of staff PPE, and extensive cleaning of exam room while observing appropriate contact time as indicated for disinfecting solutions.  Subjective:     Patient ID: Dawn Rodriguez , female    DOB: Jan 05, 1936 , 85 y.o.   MRN: [id number]   Chief Complaint  Patient presents with   Mass    Back of neck    HPI  The patient is here for an evaluation of a bump on the back of her neck. When taking her wig off she felt a bump to the back of her head. Denies pain or itching. No fever.   She is also having fatigue. She admits to having her 3 grandchildren during the summer age 80, 30, and 78; she feels like she is tired from caring for them during the summer    Past Medical History:  Diagnosis Date   Arthritis    Bilateral dry eyes    Cancer ([place]) 02/2018   left breast cancer   Cataracts, bilateral    Chronic headache    GERD (gastroesophageal reflux disease)    Hypertension    Personal history of radiation therapy    TMJ syndrome    Vaginal discharge 11/12/2018     Family History  Problem Relation Age of Onset   Pneumonia Mother      Current Outpatient Medications:    ALPRAZolam (XANAX) 0.25 MG tablet, Take 1 tablet (0.25 mg total) by mouth at bedtime as needed for anxiety., Disp: 30 tablet, Rfl: 0   amLODipine (NORVASC) 2.5 MG tablet, TAKE 1 TABLET EVERY DAY, Disp: 90 tablet, Rfl: 1   Calcium Carb-Cholecalciferol (CALCIUM-VITAMIN D3) 600-400 MG-UNIT TABS, Take by mouth 2 (two) times daily., Disp: , Rfl:    cycloSPORINE (RESTASIS) 0.05 % ophthalmic emulsion, Place 1 drop into both eyes 2 (two) times  daily., Disp: 0.4 mL, Rfl:    Garlic 123XX123 MG CAPS, Take 1,000 mg by mouth daily. , Disp: , Rfl:    Glycerin-Polysorbate 80 (REFRESH DRY EYE THERAPY OP), Place 1-2 drops into both eyes 4 (four) times daily as needed (for dry eyes). , Disp: , Rfl:    ipratropium (ATROVENT) 0.06 % nasal spray, PLACE 2 SPRAYS IN EACH NOSTRIL 4 TIMES DAILY, Disp: 15 mL, Rfl: 1   KRILL OIL OMEGA-3 PO, Take 1 capsule by mouth daily., Disp: , Rfl:    letrozole (FEMARA) 2.5 MG tablet, Take 1 tablet (2.5 mg total) by mouth daily., Disp: 90 tablet, Rfl: 3   Magnesium 250 MG TABS, Take 250 mg by mouth daily. , Disp: , Rfl:    mometasone (ELOCON) 0.1 % cream, APPLY TO AFFECTED AREA DAILY, Disp: 45 g, Rfl: 1   olopatadine (PATADAY) 0.1 % ophthalmic solution, Place 1 drop into both eyes 2 (two) times daily., Disp: 5 mL, Rfl: 12   omeprazole (PRILOSEC) 20 MG capsule, TAKE 1 CAPSULE EVERY DAY, Disp: 90 capsule, Rfl: 1   potassium gluconate 595 MG TABS, Take 595 mg by mouth daily., Disp: , Rfl:    ciclopirox (PENLAC) 8 % solution, Apply topically at bedtime. Apply over nail and surrounding skin. Apply daily over previous coat. After seven (7) days,  may remove with alcohol and continue cycle. (Patient not taking: Reported on 04/08/2021), Disp: 6.6 mL, Rfl: 0   vitamin B-12 (CYANOCOBALAMIN) 1000 MCG tablet, Take 1,000 mcg by mouth daily. (Patient not taking: No sig reported), Disp: , Rfl:    Allergies  Allergen Reactions   Tobramycin Itching, Swelling and Rash    Tobramycin eye drops caused swelling, itching, drainage, rash and peeling of the skin     Review of Systems  Constitutional: Negative.   Respiratory: Negative.    Cardiovascular: Negative.  Negative for chest pain, palpitations and leg swelling.  Gastrointestinal: Negative.   Neurological:  Negative for dizziness and headaches.  Psychiatric/Behavioral: Negative.    All other systems reviewed and are negative.   Today's Vitals   04/08/21 1502  BP: 124/68  Pulse:  69  Temp: 98.3 F (36.8 C)  TempSrc: Oral  Weight: 155 lb (70.3 kg)  Height: 5' 4.2" (1.631 m)  PainSc: 0-No pain   Body mass index is 26.44 kg/m.   Objective:  Physical Exam Vitals reviewed.  Constitutional:      General: She is not in acute distress.    Appearance: Normal appearance. She is well-developed.  Eyes:     Extraocular Movements: Extraocular movements intact.     Conjunctiva/sclera: Conjunctivae normal.     Pupils: Pupils are equal, round, and reactive to light.  Cardiovascular:     Rate and Rhythm: Normal rate and regular rhythm.     Pulses: Normal pulses.     Heart sounds: Normal heart sounds. No murmur heard. Pulmonary:     Effort: Pulmonary effort is normal. No respiratory distress.     Breath sounds: Normal breath sounds. No wheezing.  Musculoskeletal:        General: Normal range of motion.  Skin:    General: Skin is warm and dry.     Capillary Refill: Capillary refill takes less than 2 seconds.  Neurological:     General: No focal deficit present.     Mental Status: She is alert and oriented to person, place, and time.     Cranial Nerves: No cranial nerve deficit.     Motor: No weakness.  Psychiatric:        Mood and Affect: Mood normal.        Behavior: Behavior normal.        Thought Content: Thought content normal.        Judgment: Judgment normal.        Assessment And Plan:     1. Other fatigue  She is to restart her vitamin B12 supplement daily. Offered to check vitamin B12 however she does not want to do that today would like to take vitamin B12 supplement. If not better at next visit will check. Also encouraged her to listen to her body when she is tired.   The area she feels is a knot is her vertebrae.   Patient was given opportunity to ask questions. Patient verbalized understanding of the plan and was able to repeat key elements of the plan. All questions were answered to their satisfaction.  [name], FNP   I, [name],  FNP, have reviewed all documentation for this visit. The documentation on 04/08/21 for the exam, diagnosis, procedures, and orders are all accurate and complete.   IF YOU HAVE BEEN REFERRED TO A SPECIALIST, IT MAY TAKE 1-2 WEEKS TO SCHEDULE/PROCESS THE REFERRAL. IF YOU HAVE NOT HEARD FROM US/SPECIALIST IN TWO WEEKS, PLEASE GIVE [place] A CALL AT  [phone] X [phone].   THE PATIENT IS ENCOURAGED TO PRACTICE SOCIAL DISTANCING DUE TO THE COVID-19 PANDEMIC.    

## 2021-04-27 ENCOUNTER — Other Ambulatory Visit: Payer: Self-pay | Admitting: Nurse Practitioner

## 2021-04-27 MED ORDER — AMLODIPINE BESYLATE 2.5 MG PO TABS: 2.5000 mg | ORAL_TABLET | Freq: Every day | ORAL | 1 refills | Status: DC

## 2021-05-20 ENCOUNTER — Telehealth: Payer: Self-pay | Admitting: Nurse Practitioner

## 2021-05-20 NOTE — Telephone Encounter (Signed)
Left message for patient to call back and schedule Medicare Annual Wellness Visit (AWV) either virtually or in office.  Left both my jabber number [phone] and office number    Last AWV 07/15/20 ; please schedule at anytime with [facility]    This should be a 45 minute visit.   [employer] can do AWVS calendar year

## 2021-06-24 ENCOUNTER — Ambulatory Visit (INDEPENDENT_AMBULATORY_CARE_PROVIDER_SITE_OTHER): Payer: Medicare PPO

## 2021-06-24 ENCOUNTER — Other Ambulatory Visit: Payer: Self-pay

## 2021-06-24 VITALS — BP 138/62 | HR 84 | Temp 98.6°F | Ht 63.6 in | Wt 155.6 lb

## 2021-06-24 DIAGNOSIS — Z0000 Encounter for general adult medical examination without abnormal findings: Secondary | ICD-10-CM

## 2021-06-24 NOTE — Progress Notes (Signed)
This visit occurred during the SARS-CoV-2 public health emergency.  Safety protocols were in place, including screening questions prior to the visit, additional usage of staff PPE, and extensive cleaning of exam room while observing appropriate contact time as indicated for disinfecting solutions.  Subjective:   Dawn Rodriguez is a 85 y.o. female who presents for Medicare Annual (Subsequent) preventive examination.  Review of Systems     Cardiac Risk Factors include: advanced age (>45men, >63 women);hypertension     Objective:    Today's Vitals   06/24/21 1501  BP: 138/62  Pulse: 84  Temp: 98.6 F (37 C)  TempSrc: Oral  SpO2: 98%  Weight: 155 lb 9.6 oz (70.6 kg)  Height: 5' 3.6" (1.615 m)   Body mass index is 27.05 kg/m.  Advanced Directives 06/24/2021 07/15/2020 06/19/2019 07/16/2018 05/23/2018 04/19/2018 04/10/2018  Does Patient Have a Medical Advance Directive? No No No No No No No  Would patient like information on creating a medical advance directive? No - Patient declined - - - Yes (MAU/Ambulatory/Procedural Areas - Information given) - No - Patient declined    Current Medications (verified) Outpatient Encounter Medications as of 06/24/2021  Medication Sig   ALPRAZolam (XANAX) 0.25 MG tablet Take 1 tablet (0.25 mg total) by mouth at bedtime as needed for anxiety.   amLODipine (NORVASC) 2.5 MG tablet Take 1 tablet (2.5 mg total) by mouth daily.   Calcium Carb-Cholecalciferol (CALCIUM-VITAMIN D3) 600-400 MG-UNIT TABS Take by mouth 2 (two) times daily.   cycloSPORINE (RESTASIS) 0.05 % ophthalmic emulsion Place 1 drop into both eyes 2 (two) times daily.   Garlic 8119 MG CAPS Take 1,000 mg by mouth daily.    Glycerin-Polysorbate 80 (REFRESH DRY EYE THERAPY OP) Place 1-2 drops into both eyes 4 (four) times daily as needed (for dry eyes).    ipratropium (ATROVENT) 0.06 % nasal spray PLACE 2 SPRAYS IN EACH NOSTRIL 4 TIMES DAILY   KRILL OIL OMEGA-3 PO Take 1 capsule by mouth  daily.   letrozole (FEMARA) 2.5 MG tablet Take 1 tablet (2.5 mg total) by mouth daily.   Magnesium 250 MG TABS Take 250 mg by mouth daily.    mometasone (ELOCON) 0.1 % cream APPLY TO AFFECTED AREA DAILY   olopatadine (PATADAY) 0.1 % ophthalmic solution Place 1 drop into both eyes 2 (two) times daily.   omeprazole (PRILOSEC) 20 MG capsule TAKE 1 CAPSULE EVERY DAY   vitamin B-12 (CYANOCOBALAMIN) 1000 MCG tablet Take 1,000 mcg by mouth daily.   ciclopirox (PENLAC) 8 % solution Apply topically at bedtime. Apply over nail and surrounding skin. Apply daily over previous coat. After seven (7) days, may remove with alcohol and continue cycle. (Patient not taking: No sig reported)   potassium gluconate 595 MG TABS Take 595 mg by mouth daily. (Patient not taking: Reported on 06/24/2021)   No facility-administered encounter medications on file as of 06/24/2021.    Allergies (verified) Tobramycin   History: Past Medical History:  Diagnosis Date   Arthritis    Bilateral dry eyes    Cancer ([place]) 02/2018   left breast cancer   Cataracts, bilateral    Chronic headache    GERD (gastroesophageal reflux disease)    Hypertension    Personal history of radiation therapy    TMJ syndrome    Vaginal discharge 11/12/2018   Past Surgical History:  Procedure Laterality Date   BREAST BIOPSY Right 2012   stereotatic biopsy   BREAST BIOPSY Left 03/14/2018   BREAST BIOPSY Right  03/19/2020   BREAST LUMPECTOMY Left 04/06/2018   Procedure: LEFT BREAST LUMPECTOMY;  Surgeon: Excell Seltzer, MD;  Location: [facility];  Service: General;  Laterality: Left;   CATARACT EXTRACTION     Family History  Problem Relation Age of Onset   Pneumonia Mother    Social History   Socioeconomic History   Marital status: Divorced    Spouse name: Not on file   Number of children: 2   Years of education: Not on file   Highest education level: Not on file  Occupational History   Occupation: retired   Tobacco Use   Smoking status: Never   Smokeless tobacco: Never  Vaping Use   Vaping Use: Never used  Substance and Sexual Activity   Alcohol use: No   Drug use: No   Sexual activity: Not Currently    Birth control/protection: None  Other Topics Concern   Not on file  Social History Narrative   Not on file   Social Determinants of Health   Financial Resource Strain: Low Risk    Difficulty of Paying Living Expenses: Not hard at all  Food Insecurity: No Food Insecurity   Worried About Charity fundraiser in the Last Year: Never true   [place] in the Last Year: Never true  Transportation Needs: No Transportation Needs   Lack of Transportation (Medical): No   Lack of Transportation (Non-Medical): No  Physical Activity: Insufficiently Active   Days of Exercise per Week: 1 day   Minutes of Exercise per Session: 60 min  Stress: No Stress Concern Present   Feeling of Stress : Not at all  Social Connections: Not on file    Tobacco Counseling Counseling given: Not Answered   Clinical Intake:  Pre-visit preparation completed: Yes  Pain : No/denies pain     Nutritional Status: BMI 25 -29 Overweight Nutritional Risks: None Diabetes: No  How often do you need to have someone help you when you read instructions, pamphlets, or other written materials from your doctor or pharmacy?: 1 - Never What is the last grade level you completed in school?: master's degree  Diabetic? no  Interpreter Needed?: No  Information entered by :: NAllen LPN   Activities of Daily Living In your present state of health, do you have any difficulty performing the following activities: 06/24/2021 07/15/2020  Hearing? N N  Vision? N N  Difficulty concentrating or making decisions? N N  Walking or climbing stairs? N N  Dressing or bathing? N N  Doing errands, shopping? N N  Preparing Food and eating ? N N  Using the Toilet? N N  In the past six months, have you accidently leaked  urine? N Y  Comment - with sneezing and coughing, wears pads sometimes  Do you have problems with loss of bowel control? N N  Managing your Medications? N N  Managing your Finances? N N  Housekeeping or managing your Housekeeping? N N  Some recent data might be hidden    Patient Care Team: [name], FNP as PCP - General (General Practice) Excell Seltzer, MD (Inactive) as Consulting Physician (General Surgery) Nicholas Lose, MD as Consulting Physician (Hematology and Oncology) Gery Pray, MD as Consulting Physician (Radiation Oncology)  Indicate any recent [place] you may have received from other than [facility] providers in the past year (date may be approximate).     Assessment:   This is a routine wellness examination for Dawn Rodriguez.  Hearing/Vision screen Vision  Screening - Comments:: Regular eye exams, Dr. Katy Fitch  Dietary issues and exercise activities discussed: Current Exercise Habits: Home exercise routine, Type of exercise: walking, Time (Minutes): 60, Frequency (Times/Week): 1, Weekly Exercise (Minutes/Week): 60   Goals Addressed             This Visit's Progress    Patient Stated       06/24/2021, wants to eat properly       Depression Screen PHQ 2/9 Scores 06/24/2021 07/15/2020 12/23/2019 09/12/2019 06/19/2019 07/16/2018 05/23/2018  PHQ - 2 Score 0 0 0 0 0 0 0  PHQ- 9 Score - - - - 0 - -    Fall Risk Fall Risk  06/24/2021 07/15/2020 12/23/2019 09/12/2019 06/19/2019  Falls in the past year? 0 1 0 1 0  Comment - tripped on brick - - -  Number falls in past yr: - 0 0 0 0  Injury with Fall? - 0 0 0 -  Risk for fall due to : Medication side effect Medication side effect - - Medication side effect  Follow up Falls evaluation completed;Education provided;Falls prevention discussed Falls evaluation completed;Falls prevention discussed;Education provided - - Falls evaluation completed;Education provided;Falls prevention discussed    FALL RISK PREVENTION  PERTAINING TO THE HOME:  Any stairs in or around the home? Yes  If so, are there any without handrails? No  Home free of loose throw rugs in walkways, pet beds, electrical cords, etc? Yes  Adequate lighting in your home to reduce risk of falls? Yes   ASSISTIVE DEVICES UTILIZED TO PREVENT FALLS:  Life alert? No  Use of a cane, walker or w/c? No  Grab bars in the bathroom? No  Shower chair or bench in shower? Yes  Elevated toilet seat or a handicapped toilet? No   TIMED UP AND GO:  Was the test performed? No .    Gait slow and steady without use of assistive device  Cognitive Function:     6CIT Screen 06/24/2021 07/15/2020 06/19/2019 05/23/2018  What Year? 0 points 0 points 0 points 0 points  What month? 0 points 0 points 0 points 0 points  What time? 0 points 0 points 0 points 0 points  Count back from 20 0 points 0 points 0 points 0 points  Months in reverse 0 points 0 points 2 points 2 points  Repeat phrase 6 points 2 points 6 points 0 points  Total Score 6 2 8 2     Immunizations Immunization History  Administered Date(s) Administered   Moderna Sars-Covid-2 Vaccination 11/08/2019, 12/09/2019   PFIZER(Purple Top)SARS-COV-2 Vaccination 07/15/2020   Pneumococcal Conjugate-13 06/19/2019, 06/23/2019   Pneumococcal Polysaccharide-23 01/12/2021   Tdap 05/17/2013    TDAP status: Up to date  Flu Vaccine status: Declined, Education has been provided regarding the importance of this vaccine but patient still declined. Advised may receive this vaccine at local pharmacy or Health Dept. Aware to provide a copy of the vaccination record if obtained from local pharmacy or Health Dept. Verbalized acceptance and understanding.  Pneumococcal vaccine status: Up to date  Covid-19 vaccine status: Completed vaccines  Qualifies for Shingles Vaccine? Yes   Zostavax completed No   Shingrix Completed?: No.    Education has been provided regarding the importance of this vaccine. Patient has  been advised to call insurance company to determine out of pocket expense if they have not yet received this vaccine. Advised may also receive vaccine at local pharmacy or Health Dept. Verbalized acceptance and understanding.  Screening Tests  Health Maintenance  Topic Date Due   Zoster Vaccines- Shingrix (1 of 2) Never done   COVID-19 Vaccine (4 - Booster) 09/09/2020   INFLUENZA VACCINE  Never done   TETANUS/TDAP  05/18/2023   Pneumonia Vaccine 17+ Years old  Completed   DEXA SCAN  Completed   HPV VACCINES  Aged Out    Health Maintenance  Health Maintenance Due  Topic Date Due   Zoster Vaccines- Shingrix (1 of 2) Never done   COVID-19 Vaccine (4 - Booster) 09/09/2020   INFLUENZA VACCINE  Never done    Colorectal cancer screening: No longer required.   Mammogram status: Completed 01/27/2021. Repeat every year  Bone Density status: Completed 07/21/2017.   Lung Cancer Screening: (Low Dose CT Chest recommended if Age 68-80 years, 30 pack-year currently smoking OR have quit w/in 15years.) does not qualify.   Lung Cancer Screening Referral: no  Additional Screening:  Hepatitis C Screening: does not qualify;  Vision Screening: Recommended annual ophthalmology exams for early detection of glaucoma and other disorders of the eye. Is the patient up to date with their annual eye exam?  Yes  Who is the provider or what is the name of the office in which the patient attends annual eye exams? Dr. Katy Fitch If pt is not established with a provider, would they like to be referred to a provider to establish care? No .   Dental Screening: Recommended annual dental exams for proper oral hygiene  Community Resource Referral / Chronic Care Management: CRR required this visit?  No   CCM required this visit?  No      Plan:     I have personally reviewed and noted the following in the patient's chart:   Medical and social history Use of alcohol, tobacco or illicit drugs  Current  medications and supplements including opioid prescriptions.  Functional ability and status Nutritional status Physical activity Advanced directives List of other physicians Hospitalizations, surgeries, and ER visits in previous 12 months Vitals Screenings to include cognitive, depression, and falls Referrals and appointments  In addition, I have reviewed and discussed with patient certain preventive protocols, quality metrics, and best practice recommendations. A written personalized care plan for preventive services as well as general preventive health recommendations were provided to patient.     Kellie Simmering, LPN   [id number]   Nurse Notes: none

## 2021-06-24 NOTE — Patient Instructions (Signed)
Dawn Rodriguez , Thank you for taking time to come for your Medicare Wellness Visit. I appreciate your ongoing commitment to your health goals. Please review the following plan we discussed and let me know if I can assist you in the future.   Screening recommendations/referrals: Colonoscopy: not required Mammogram: completed 01/27/2021 Bone Density: completed 07/21/2017 Recommended yearly ophthalmology/optometry visit for glaucoma screening and checkup Recommended yearly dental visit for hygiene and checkup  Vaccinations: Influenza vaccine: decline Pneumococcal vaccine: completed 01/12/2021 Tdap vaccine: completed 05/17/2013, due 05/18/2023 Shingles vaccine: discussed   Covid-19:07/15/2020, 12/09/2019, 11/08/2019  Advanced directives: Advance directive discussed with you today. Even though you declined this today please call our office should you change your mind and we can give you the proper paperwork for you to fill out.  Conditions/risks identified: none  Next appointment: Follow up in one year for your annual wellness visit    Preventive Care 65 Years and Older, Female Preventive care refers to lifestyle choices and visits with your health care provider that can promote health and wellness. What does preventive care include? A yearly physical exam. This is also called an annual well check. Dental exams once or twice a year. Routine eye exams. Ask your health care provider how often you should have your eyes checked. Personal lifestyle choices, including: Daily care of your teeth and gums. Regular physical activity. Eating a healthy diet. Avoiding tobacco and drug use. Limiting alcohol use. Practicing safe sex. Taking low-dose aspirin every day. Taking vitamin and mineral supplements as recommended by your health care provider. What happens during an annual well check? The services and screenings done by your health care provider during your annual well check will depend on your age,  overall health, lifestyle risk factors, and family history of disease. Counseling  Your health care provider may ask you questions about your: Alcohol use. Tobacco use. Drug use. Emotional well-being. Home and relationship well-being. Sexual activity. Eating habits. History of falls. Memory and ability to understand (cognition). Work and work Statistician. Reproductive health. Screening  You may have the following tests or measurements: Height, weight, and BMI. Blood pressure. Lipid and cholesterol levels. These may be checked every 5 years, or more frequently if you are over 40 years old. Skin check. Lung cancer screening. You may have this screening every year starting at age 61 if you have a 30-pack-year history of smoking and currently smoke or have quit within the past 15 years. Fecal occult blood test (FOBT) of the stool. You may have this test every year starting at age 27. Flexible sigmoidoscopy or colonoscopy. You may have a sigmoidoscopy every 5 years or a colonoscopy every 10 years starting at age 27. Hepatitis C blood test. Hepatitis B blood test. Sexually transmitted disease (STD) testing. Diabetes screening. This is done by checking your blood sugar (glucose) after you have not eaten for a while (fasting). You may have this done every 1-3 years. Bone density scan. This is done to screen for osteoporosis. You may have this done starting at age 7. Mammogram. This may be done every 1-2 years. Talk to your health care provider about how often you should have regular mammograms. Talk with your health care provider about your test results, treatment options, and if necessary, the need for more tests. Vaccines  Your health care provider may recommend certain vaccines, such as: Influenza vaccine. This is recommended every year. Tetanus, diphtheria, and acellular pertussis (Tdap, Td) vaccine. You may need a Td booster every 10 years. Zoster vaccine.  You may need this after age  5. Pneumococcal 13-valent conjugate (PCV13) vaccine. One dose is recommended after age 38. Pneumococcal polysaccharide (PPSV23) vaccine. One dose is recommended after age 93. Talk to your health care provider about which screenings and vaccines you need and how often you need them. This information is not intended to replace advice given to you by your health care provider. Make sure you discuss any questions you have with your health care provider. Document Released: 09/04/2015 Document Revised: 04/27/2016 Document Reviewed: 06/09/2015 Elsevier Interactive Patient Education  2017 [employer] [employer] Prevention in the Home Falls can cause injuries. They can happen to people of all ages. There are many things you can do to make your home safe and to help prevent falls. What can I do on the outside of my home? Regularly fix the edges of walkways and driveways and fix any cracks. Remove anything that might make you trip as you walk through a door, such as a raised step or threshold. Trim any bushes or trees on the path to your home. Use bright outdoor lighting. Clear any walking paths of anything that might make someone trip, such as rocks or tools. Regularly check to see if handrails are loose or broken. Make sure that both sides of any steps have handrails. Any raised decks and porches should have guardrails on the edges. Have any leaves, snow, or ice cleared regularly. Use sand or salt on walking paths during winter. Clean up any spills in your garage right away. This includes oil or grease spills. What can I do in the bathroom? Use night lights. Install grab bars by the toilet and in the tub and shower. Do not use towel bars as grab bars. Use non-skid mats or decals in the tub or shower. If you need to sit down in the shower, use a plastic, non-slip stool. Keep the floor dry. Clean up any water that spills on the floor as soon as it happens. Remove soap buildup in the tub or shower  regularly. Attach bath mats securely with double-sided non-slip rug tape. Do not have throw rugs and other things on the floor that can make you trip. What can I do in the bedroom? Use night lights. Make sure that you have a light by your bed that is easy to reach. Do not use any sheets or blankets that are too big for your bed. They should not hang down onto the floor. Have a firm chair that has side arms. You can use this for support while you get dressed. Do not have throw rugs and other things on the floor that can make you trip. What can I do in the kitchen? Clean up any spills right away. Avoid walking on wet floors. Keep items that you use a lot in easy-to-reach places. If you need to reach something above you, use a strong step stool that has a grab bar. Keep electrical cords out of the way. Do not use floor polish or wax that makes floors slippery. If you must use wax, use non-skid floor wax. Do not have throw rugs and other things on the floor that can make you trip. What can I do with my stairs? Do not leave any items on the stairs. Make sure that there are handrails on both sides of the stairs and use them. Fix handrails that are broken or loose. Make sure that handrails are as long as the stairways. Check any carpeting to make sure that it is  firmly attached to the stairs. Fix any carpet that is loose or worn. Avoid having throw rugs at the top or bottom of the stairs. If you do have throw rugs, attach them to the floor with carpet tape. Make sure that you have a light switch at the top of the stairs and the bottom of the stairs. If you do not have them, ask someone to add them for you. What else can I do to help prevent falls? Wear shoes that: Do not have high heels. Have rubber bottoms. Are comfortable and fit you well. Are closed at the toe. Do not wear sandals. If you use a stepladder: Make sure that it is fully opened. Do not climb a closed stepladder. Make sure that  both sides of the stepladder are locked into place. Ask someone to hold it for you, if possible. Clearly mark and make sure that you can see: Any grab bars or handrails. First and last steps. Where the edge of each step is. Use tools that help you move around (mobility aids) if they are needed. These include: Canes. Walkers. Scooters. Crutches. Turn on the lights when you go into a dark area. Replace any light bulbs as soon as they burn out. Set up your furniture so you have a clear path. Avoid moving your furniture around. If any of your floors are uneven, fix them. If there are any pets around you, be aware of where they are. Review your medicines with your doctor. Some medicines can make you feel dizzy. This can increase your chance of falling. Ask your doctor what other things that you can do to help prevent falls. This information is not intended to replace advice given to you by your health care provider. Make sure you discuss any questions you have with your health care provider. Document Released: 06/04/2009 Document Revised: 01/14/2016 Document Reviewed: 09/12/2014 Elsevier Interactive Patient Education  2017 [employer].

## 2021-06-29 ENCOUNTER — Other Ambulatory Visit: Payer: Self-pay

## 2021-06-29 ENCOUNTER — Ambulatory Visit: Payer: Medicare PPO | Admitting: Nurse Practitioner

## 2021-06-29 ENCOUNTER — Encounter: Payer: Self-pay | Admitting: Nurse Practitioner

## 2021-06-29 VITALS — BP 120/72 | HR 95 | Temp 98.7°F | Ht 63.0 in | Wt 155.6 lb

## 2021-06-29 DIAGNOSIS — R82998 Other abnormal findings in urine: Secondary | ICD-10-CM

## 2021-06-29 DIAGNOSIS — R7309 Other abnormal glucose: Secondary | ICD-10-CM

## 2021-06-29 DIAGNOSIS — I10 Essential (primary) hypertension: Secondary | ICD-10-CM | POA: Diagnosis not present

## 2021-06-29 DIAGNOSIS — Z23 Encounter for immunization: Secondary | ICD-10-CM

## 2021-06-29 LAB — POCT URINALYSIS DIPSTICK
Bilirubin, UA: NEGATIVE
Blood, UA: NEGATIVE
Glucose, UA: NEGATIVE
Ketones, UA: NEGATIVE
Nitrite, UA: NEGATIVE
Protein, UA: NEGATIVE
Spec Grav, UA: 1.015 (ref 1.010–1.025)
Urobilinogen, UA: 1 E.U./dL
pH, UA: 6 (ref 5.0–8.0)

## 2021-06-29 LAB — POCT UA - MICROALBUMIN
Albumin/Creatinine Ratio, Urine, POC: 30
Creatinine, POC: 200 mg/dL
Microalbumin Ur, POC: 30 mg/L

## 2021-06-29 NOTE — Progress Notes (Signed)
I,Victoria T Hamilton,acting as a Education administrator for [name], FNP.,have documented all relevant documentation on the behalf of [name], FNP,as directed by  [name], FNP while in the presence of [name], [place].   This visit occurred during the SARS-CoV-2 public health emergency.  Safety protocols were in place, including screening questions prior to the visit, additional usage of staff PPE, and extensive cleaning of exam room while observing appropriate contact time as indicated for disinfecting solutions.  Subjective:     Patient ID: Dawn Rodriguez , female    DOB: [birth year] , 85 y.o.   MRN: [id number]   Chief Complaint  Patient presents with   Hypertension   abnormal glucose    HPI  Patient presents today for a f/u on her bp and abnormal glucose.  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 She goes from 9a-2p when she does go. She had been lifting boxes at home and injured her back and missed 4 days, needs a doctor's note to return to volunteer.   Hypertension This is a chronic problem.    Past Medical History:  Diagnosis Date   Arthritis    Bilateral dry eyes    Cancer ([place]) 02/2018   left breast cancer   Cataracts, bilateral    Chronic headache    GERD (gastroesophageal reflux disease)    Hypertension    Personal history of radiation therapy    TMJ syndrome    Vaginal discharge 11/12/2018     Family History  Problem Relation Age of Onset   Pneumonia Mother      Current Outpatient Medications:    ALPRAZolam (XANAX) 0.25 MG tablet, Take 1 tablet (0.25 mg total) by mouth at bedtime as needed for anxiety., Disp: 30 tablet, Rfl: 0   amLODipine (NORVASC) 2.5 MG tablet, Take 1 tablet (2.5 mg total) by mouth daily., Disp: 90 tablet, Rfl: 1   Calcium Carb-Cholecalciferol (CALCIUM-VITAMIN D3) 600-400 MG-UNIT TABS, Take by  mouth 2 (two) times daily., Disp: , Rfl:    cycloSPORINE (RESTASIS) 0.05 % ophthalmic emulsion, Place 1 drop into both eyes 2 (two) times daily., Disp: 0.4 mL, Rfl:    Garlic 5462 MG CAPS, Take 1,000 mg by mouth daily. , Disp: , Rfl:    Glycerin-Polysorbate 80 (REFRESH DRY EYE THERAPY OP), Place 1-2 drops into both eyes 4 (four) times daily as needed (for dry eyes). , Disp: , Rfl:    ipratropium (ATROVENT) 0.06 % nasal spray, PLACE 2 SPRAYS IN EACH NOSTRIL 4 TIMES DAILY, Disp: 15 mL, Rfl: 1   KRILL OIL OMEGA-3 PO, Take 1 capsule by mouth daily., Disp: , Rfl:    letrozole (FEMARA) 2.5 MG tablet, Take 1 tablet (2.5 mg total) by mouth daily., Disp: 90 tablet, Rfl: 3   Magnesium 250 MG TABS, Take 250 mg by mouth daily. , Disp: , Rfl:    mometasone (ELOCON) 0.1 % cream, APPLY TO AFFECTED AREA DAILY, Disp: 45 g, Rfl: 1   olopatadine (PATADAY) 0.1 % ophthalmic solution, Place 1 drop into both eyes 2 (two) times daily., Disp: 5 mL, Rfl: 12   omeprazole (PRILOSEC) 20 MG capsule, TAKE 1 CAPSULE EVERY DAY, Disp: 90 capsule, Rfl: 1   vitamin B-12 (CYANOCOBALAMIN) 1000 MCG tablet, Take 1,000 mcg by mouth daily., Disp: , Rfl:    ciclopirox (PENLAC) 8 % solution, Apply topically at bedtime.  Apply over nail and surrounding skin. Apply daily over previous coat. After seven (7) days, may remove with alcohol and continue cycle. (Patient not taking: No sig reported), Disp: 6.6 mL, Rfl: 0   potassium gluconate 595 MG TABS, Take 595 mg by mouth daily. (Patient not taking: Reported on 06/24/2021), Disp: , Rfl:    Allergies  Allergen Reactions   Tobramycin Itching, Swelling and Rash    Tobramycin eye drops caused swelling, itching, drainage, rash and peeling of the skin     Review of Systems  Constitutional: Negative.   Respiratory: Negative.    Cardiovascular: Negative.   Neurological: Negative.   Psychiatric/Behavioral: Negative.      Today's Vitals   06/29/21 1520  BP: 120/72  Pulse: 95  Temp: 98.7 F  (37.1 C)  Weight: 155 lb 9.6 oz (70.6 kg)  Height: _0  (1.6 m)  PainSc: 0-No pain   Body mass index is 27.56 kg/m.  Wt Readings from Last 3 Encounters:  06/29/21 155 lb 9.6 oz (70.6 kg)  06/24/21 155 lb 9.6 oz (70.6 kg)  04/08/21 155 lb (70.3 kg)    Objective:  Physical Exam Vitals reviewed.  Constitutional:      General: She is not in acute distress.    Appearance: Normal appearance. She is well-developed.  Eyes:     Pupils: Pupils are equal, round, and reactive to light.  Cardiovascular:     Rate and Rhythm: Normal rate and regular rhythm.     Pulses: Normal pulses.     Heart sounds: Normal heart sounds. No murmur heard. Pulmonary:     Effort: Pulmonary effort is normal. No respiratory distress.     Breath sounds: Normal breath sounds. No wheezing.  Musculoskeletal:        General: No swelling or tenderness. Normal range of motion.  Skin:    General: Skin is warm and dry.     Capillary Refill: Capillary refill takes less than 2 seconds.  Neurological:     General: No focal deficit present.     Mental Status: She is alert and oriented to person, place, and time.     Cranial Nerves: No cranial nerve deficit.     Motor: No weakness.  Psychiatric:        Mood and Affect: Mood normal.        Behavior: Behavior normal.        Thought Content: Thought content normal.        Judgment: Judgment normal.        Assessment And Plan:     1. Essential hypertension Comments: Well controlled, continue current medications - BMP8+eGFR - Lipid panel - POCT Urinalysis Dipstick (81002) - POCT UA - Microalbumin  2. Abnormal glucose Comments: Stable, diet controlled  - Hemoglobin A1c  3. Urine white blood cells increased Comments: small leukocytes, will send urine culture Stay well hydrated with water - Urine Culture  4. Immunization due     Patient was given opportunity to ask questions. Patient verbalized understanding of the plan and was able to repeat key elements  of the plan. All questions were answered to their satisfaction.  [name], FNP   I, [name], FNP, have reviewed all documentation for this visit. The documentation on 07/21/21 for the exam, diagnosis, procedures, and orders are all accurate and complete.   IF YOU HAVE BEEN REFERRED TO A SPECIALIST, IT MAY TAKE 1-2 WEEKS TO SCHEDULE/PROCESS THE REFERRAL. IF YOU HAVE NOT HEARD FROM US/SPECIALIST IN TWO WEEKS,  PLEASE GIVE [place] A CALL AT [phone] X [phone].   THE PATIENT IS ENCOURAGED TO PRACTICE SOCIAL DISTANCING DUE TO THE COVID-19 PANDEMIC.

## 2021-06-29 NOTE — Patient Instructions (Signed)

## 2021-06-30 LAB — BMP8+EGFR
BUN/Creatinine Ratio: 13 (ref 12–28)
BUN: 10 mg/dL (ref 8–27)
CO2: 29 mmol/L (ref 20–29)
Calcium: 9.3 mg/dL (ref 8.7–10.3)
Chloride: 105 mmol/L (ref 96–106)
Creatinine, Ser: 0.79 mg/dL (ref 0.57–1.00)
Glucose: 99 mg/dL (ref 70–99)
Potassium: 4.1 mmol/L (ref 3.5–5.2)
Sodium: 143 mmol/L (ref 134–144)
eGFR: 74 mL/min/{1.73_m2} (ref >59)

## 2021-06-30 LAB — LIPID PANEL
Chol/HDL Ratio: 3.5 ratio (ref 0.0–4.4)
Cholesterol, Total: 209 mg/dL — ABNORMAL HIGH (ref 100–199)
HDL: 59 mg/dL (ref >39)
LDL Chol Calc (NIH): 141 mg/dL — ABNORMAL HIGH (ref 0–99)
Triglycerides: 53 mg/dL (ref 0–149)
VLDL Cholesterol Cal: 9 mg/dL (ref 5–40)

## 2021-06-30 LAB — HEMOGLOBIN A1C
Est. average glucose Bld gHb Est-mCnc: 117 mg/dL
Hgb A1c MFr Bld: 5.7 % — ABNORMAL HIGH (ref 4.8–5.6)

## 2021-07-03 LAB — URINE CULTURE

## 2021-07-05 ENCOUNTER — Telehealth: Payer: Self-pay | Admitting: *Deleted

## 2021-07-05 NOTE — Telephone Encounter (Signed)
Received call from pt stating she has recently moved into a townhouse and her neighbor is a smoker despite it being a non smoking community.  Pt states she is able to smell the smoke in her bathroom only.  Pt requesting advice from MD if this will negatively affect her with hx of breast cancer.  Per MD the possibility of the smoke affecting pt on a cancer standpoint is very low, especially since it is only smelled in the bathroom.  Pt appreciative of advice.

## 2021-07-13 ENCOUNTER — Other Ambulatory Visit: Payer: Self-pay

## 2021-07-13 ENCOUNTER — Ambulatory Visit (INDEPENDENT_AMBULATORY_CARE_PROVIDER_SITE_OTHER): Payer: Medicare PPO

## 2021-07-13 DIAGNOSIS — Z23 Encounter for immunization: Secondary | ICD-10-CM | POA: Diagnosis not present

## 2021-07-21 ENCOUNTER — Ambulatory Visit: Payer: Medicare PPO | Admitting: Nurse Practitioner

## 2021-07-21 MED ORDER — SHINGRIX 50 MCG/0.5ML IM SUSR
0.5000 mL | Freq: Once | INTRAMUSCULAR | 1 refills | Status: AC
Start: 2021-07-21 — End: 2021-07-21

## 2021-07-31 ENCOUNTER — Other Ambulatory Visit: Payer: Self-pay | Admitting: Nurse Practitioner

## 2021-08-20 ENCOUNTER — Ambulatory Visit (INDEPENDENT_AMBULATORY_CARE_PROVIDER_SITE_OTHER): Payer: Medicare PPO

## 2021-08-20 ENCOUNTER — Ambulatory Visit (HOSPITAL_COMMUNITY)
Admission: EM | Admit: 2021-08-20 | Discharge: 2021-08-20 | Disposition: A | Discharge status: Home / Self Care | Payer: Medicare PPO | Attending: Physician Assistant | Admitting: Physician Assistant

## 2021-08-20 ENCOUNTER — Encounter (HOSPITAL_COMMUNITY): Payer: Self-pay

## 2021-08-20 ENCOUNTER — Other Ambulatory Visit: Payer: Self-pay

## 2021-08-20 DIAGNOSIS — M19011 Primary osteoarthritis, right shoulder: Secondary | ICD-10-CM | POA: Diagnosis not present

## 2021-08-20 DIAGNOSIS — M25511 Pain in right shoulder: Secondary | ICD-10-CM

## 2021-08-20 MED ORDER — MELOXICAM 15 MG PO TABS: 15.0000 mg | ORAL_TABLET | Freq: Every day | ORAL | 0 refills | Status: DC

## 2021-08-20 NOTE — ED Triage Notes (Signed)
Pt presents with c/o pain to the R arm. Presents with limited ROM. Pt states she recently moved and was carrying, lifting, pushing heavy objects.   Reports trying a heating pad and states it only helped for one day.

## 2021-08-20 NOTE — Discharge Instructions (Addendum)
Schedule to see the Orthopaedist for evaluation  

## 2021-08-20 NOTE — ED Provider Notes (Signed)
[facility]    CSN: [id number] Arrival date & time: 08/20/21  1242      History   Chief Complaint Chief Complaint  Patient presents with   Arm Pain    HPI Dawn Rodriguez is a 85 y.o. female.   Pt reports she has chronic cramps in her left leg.  Pt reports she is having trouble lifting her right shoulder.  Pt thinks this may be from arthritis.  Pt reports she has had breast cancer and is taking a medication known to cause cramps in muscles.    The history is provided by the patient. No language interpreter was used.  Arm Pain This is a new problem. The current episode started more than 1 week ago. The problem occurs constantly. The problem has been gradually worsening. Nothing aggravates the symptoms. Nothing relieves the symptoms. She has tried nothing for the symptoms. The treatment provided no relief.   Past Medical History:  Diagnosis Date   Arthritis    Bilateral dry eyes    Cancer ([place]) 02/2018   left breast cancer   Cataracts, bilateral    Chronic headache    GERD (gastroesophageal reflux disease)    Hypertension    Personal history of radiation therapy    TMJ syndrome    Vaginal discharge 11/12/2018    Patient Active Problem List   Diagnosis Date Noted   Encounter for Papanicolaou smear of cervix 11/12/2018   Malignant neoplasm of upper-inner quadrant of left breast in female, estrogen receptor positive ([place]) 03/20/2018   Cataracts, bilateral    GERD (gastroesophageal reflux disease)    OSTEOARTHRITIS 10/04/2007   INTERNAL HEMORRHOIDS 09/14/2007   DIVERTICULOSIS, SIGMOID COLON 09/14/2007    Past Surgical History:  Procedure Laterality Date   BREAST BIOPSY Right 2012   stereotatic biopsy   BREAST BIOPSY Left 03/14/2018   BREAST BIOPSY Right 03/19/2020   BREAST LUMPECTOMY Left 04/06/2018   Procedure: LEFT BREAST LUMPECTOMY;  Surgeon: Excell Seltzer, MD;  Location: [facility];  Service: General;  Laterality: Left;    CATARACT EXTRACTION      OB History   No obstetric history on file.      Home Medications    Prior to Admission medications   Medication Sig Start Date End Date Taking? Authorizing Provider  ALPRAZolam (XANAX) 0.25 MG tablet Take 1 tablet (0.25 mg total) by mouth at bedtime as needed for anxiety. 01/12/21   [name], FNP  amLODipine (NORVASC) 2.5 MG tablet Take 1 tablet (2.5 mg total) by mouth daily. 04/27/21   [name], FNP  Calcium Carb-Cholecalciferol (CALCIUM-VITAMIN D3) 600-400 MG-UNIT TABS Take by mouth 2 (two) times daily.    [provider]  ciclopirox (PENLAC) 8 % solution Apply topically at bedtime. Apply over nail and surrounding skin. Apply daily over previous coat. After seven (7) days, may remove with alcohol and continue cycle. Patient not taking: No sig reported 01/12/21   [name], FNP  cycloSPORINE (RESTASIS) 0.05 % ophthalmic emulsion Place 1 drop into both eyes 2 (two) times daily. 12/31/20   Nicholas Lose, MD  Garlic 3833 MG CAPS Take 1,000 mg by mouth daily.     [provider]  Glycerin-Polysorbate 80 (REFRESH DRY EYE THERAPY OP) Place 1-2 drops into both eyes 4 (four) times daily as needed (for dry eyes).     [provider]  ipratropium (ATROVENT) 0.06 % nasal spray PLACE 2 SPRAYS IN [facility] NOSTRIL 4 TIMES DAILY 02/12/20   [name],  FNP  KRILL OIL OMEGA-3 PO Take 1 capsule by mouth daily.    [provider]  letrozole (FEMARA) 2.5 MG tablet Take 1 tablet (2.5 mg total) by mouth daily. 12/31/20   Nicholas Lose, MD  Magnesium 250 MG TABS Take 250 mg by mouth daily.     [provider]  mometasone (ELOCON) 0.1 % cream APPLY TO AFFECTED AREA DAILY 04/27/21   [name], FNP  olopatadine (PATADAY) 0.1 % ophthalmic solution Place 1 drop into both eyes 2 (two) times daily. 12/31/20   Nicholas Lose, MD  omeprazole (PRILOSEC) 20 MG capsule TAKE 1 CAPSULE EVERY DAY 08/02/21   [name], FNP  potassium gluconate 595  MG TABS Take 595 mg by mouth daily. Patient not taking: Reported on 06/24/2021    [provider]  vitamin B-12 (CYANOCOBALAMIN) 1000 MCG tablet Take 1,000 mcg by mouth daily.    [provider]    Family History Family History  Problem Relation Age of Onset   Pneumonia Mother     Social History Social History   Tobacco Use   Smoking status: Never   Smokeless tobacco: Never  Vaping Use   Vaping Use: Never used  Substance Use Topics   Alcohol use: No   Drug use: No     Allergies   Tobramycin   Review of Systems Review of Systems  All other systems reviewed and are negative.   Physical Exam Triage Vital Signs ED Triage Vitals  Enc Vitals Group     BP 08/20/21 1447 (!) 147/74     Pulse Rate 08/20/21 1447 77     Resp 08/20/21 1447 16     Temp 08/20/21 1447 97.9 F (36.6 C)     Temp Source 08/20/21 1447 Oral     SpO2 08/20/21 1447 94 %     Weight --      Height --      Head Circumference --      Peak Flow --      Pain Score 08/20/21 1446 10     Pain Loc --      Pain Edu? --      Excl. in [place]? --    No data found.  Updated Vital Signs BP (!) 147/74 (BP Location: Right Arm)    Pulse 77    Temp 97.9 F (36.6 C) (Oral)    Resp 16    SpO2 94%   Visual Acuity Right Eye Distance:   Left Eye Distance:   Bilateral Distance:    Right Eye Near:   Left Eye Near:    Bilateral Near:     Physical Exam Vitals and nursing note reviewed.  Constitutional:      Appearance: She is well-developed.  HENT:     Head: Normocephalic.  Neck:     Comments: Tender right shoulder,  limited abduction and external rotation,  Pt trys to lift with left hand.  From elbow,  from wrist and hand  Cardiovascular:     Rate and Rhythm: Normal rate.  Pulmonary:     Effort: Pulmonary effort is normal.  Abdominal:     General: There is no distension.  Musculoskeletal:        General: Normal range of motion.  Neurological:     Mental Status: She is alert and  oriented to person, place, and time.  Psychiatric:        Mood and Affect: Mood normal.     UC Treatments / Results  Labs (all labs ordered are listed, but only abnormal results are displayed) Labs Reviewed - No data to display  EKG   Radiology DG Shoulder Right  Result Date: 08/20/2021 CLINICAL DATA:  Pain. EXAM: RIGHT SHOULDER - 2+ VIEW COMPARISON:  06/30/2020. FINDINGS: There is no evidence of fracture or dislocation. Degenerative changes are identified at the acromioclavicular joint. Soft tissues are unremarkable. IMPRESSION: 1. No acute findings. 2. AC joint osteoarthritis. Electronically Signed   By: Kerby Moors M.D.   On: 08/20/2021 15:32    Procedures Procedures (including critical care time)  Medications Ordered in UC Medications - No data to display  Initial Impression / Assessment and Plan / UC Course  I have reviewed the triage vital signs and the nursing notes.  Pertinent labs & imaging results that were available during my care of the patient were reviewed by me and considered in my medical decision making (see chart for details).     MDM:  xray shows AC joint degenerative cahnges.   Final Clinical Impressions(s) / UC Diagnoses   Final diagnoses:  Primary osteoarthritis of right shoulder   Discharge Instructions   None    ED Prescriptions     Medication Sig Dispense Auth. Provider   meloxicam (MOBIC) 15 MG tablet Take 1 tablet (15 mg total) by mouth daily. 14 tablet Fransico Meadow, [place]      PDMP not reviewed this encounter.   Fransico Meadow, [place] 08/20/21 1654

## 2021-09-22 ENCOUNTER — Other Ambulatory Visit: Payer: Self-pay | Admitting: Nurse Practitioner

## 2021-09-27 DIAGNOSIS — M25511 Pain in right shoulder: Secondary | ICD-10-CM | POA: Diagnosis not present

## 2021-09-27 DIAGNOSIS — M25811 Other specified joint disorders, right shoulder: Secondary | ICD-10-CM | POA: Diagnosis not present

## 2021-10-27 ENCOUNTER — Other Ambulatory Visit: Payer: Self-pay

## 2021-10-27 ENCOUNTER — Ambulatory Visit (INDEPENDENT_AMBULATORY_CARE_PROVIDER_SITE_OTHER): Payer: Medicare PPO | Admitting: Nurse Practitioner

## 2021-10-27 ENCOUNTER — Encounter: Payer: Self-pay | Admitting: Nurse Practitioner

## 2021-10-27 VITALS — BP 128/60 | HR 83 | Temp 98.6°F | Ht 63.0 in | Wt 157.6 lb

## 2021-10-27 DIAGNOSIS — E782 Mixed hyperlipidemia: Secondary | ICD-10-CM | POA: Diagnosis not present

## 2021-10-27 DIAGNOSIS — R7309 Other abnormal glucose: Secondary | ICD-10-CM | POA: Diagnosis not present

## 2021-10-27 DIAGNOSIS — Z170 Estrogen receptor positive status [ER+]: Secondary | ICD-10-CM

## 2021-10-27 DIAGNOSIS — C50212 Malignant neoplasm of upper-inner quadrant of left female breast: Secondary | ICD-10-CM

## 2021-10-27 DIAGNOSIS — I10 Essential (primary) hypertension: Secondary | ICD-10-CM | POA: Diagnosis not present

## 2021-10-27 DIAGNOSIS — R82998 Other abnormal findings in urine: Secondary | ICD-10-CM | POA: Diagnosis not present

## 2021-10-27 NOTE — Progress Notes (Signed)
?Industrial/product designer as a Education administrator for [employer], FNP.,have documented all relevant documentation on the behalf of [name], FNP,as directed by  [name], FNP while in the presence of [name], [place]. ? ?This visit occurred during the SARS-CoV-2 public health emergency.  Safety protocols were in place, including screening questions prior to the visit, additional usage of staff PPE, and extensive cleaning of exam room while observing appropriate contact time as indicated for disinfecting solutions. ? ?Subjective:  ?  ? Patient ID: Dawn Rodriguez , female    DOB: [birth year] , 86 y.o.   MRN: [id number] ? ? ?Chief Complaint  ?Patient presents with  ? Hypertension  ? ? ?HPI ? ?Patient presents today for a f/u on her bp and abnormal glucose.  Reports had urine changes with what looked like "marshmellows" in her urine she is taking calcium, zinc and magnesium. She was seen at Urgent care for right shoulder pain thought to be arthritis. She has also seen orthopedic and received a cortisone injection. She is able to reach better but not at 100%.  ? ?Hypertension ?This is a chronic problem. The current episode started more than 1 year ago. The problem is unchanged. Pertinent negatives include no chest pain, headaches or palpitations. Risk factors for coronary artery disease include dyslipidemia. Past treatments include calcium channel blockers. There are no compliance problems.  There is no history of angina.   ? ?Past Medical History:  ?Diagnosis Date  ? Arthritis   ? Bilateral dry eyes   ? Cancer ([place]) 02/2018  ? left breast cancer  ? Cataracts, bilateral   ? Chronic headache   ? GERD (gastroesophageal reflux disease)   ? Hypertension   ? Personal history of radiation therapy   ? TMJ syndrome   ? Vaginal discharge 11/12/2018  ?  ? ?Family History  ?Problem Relation Age of Onset  ? Pneumonia Mother   ? ? ? ?Current Outpatient Medications:  ?  ALPRAZolam (XANAX) 0.25 MG tablet, Take 1 tablet (0.25 mg  total) by mouth at bedtime as needed for anxiety., Disp: 30 tablet, Rfl: 0 ?  amLODipine (NORVASC) 2.5 MG tablet, TAKE 1 TABLET EVERY DAY, Disp: 90 tablet, Rfl: 1 ?  Calcium Carb-Cholecalciferol (CALCIUM-VITAMIN D3) 600-400 MG-UNIT TABS, Take by mouth 2 (two) times daily., Disp: , Rfl:  ?  ciclopirox (PENLAC) 8 % solution, Apply topically at bedtime. Apply over nail and surrounding skin. Apply daily over previous coat. After seven (7) days, may remove with alcohol and continue cycle., Disp: 6.6 mL, Rfl: 0 ?  cycloSPORINE (RESTASIS) 0.05 % ophthalmic emulsion, Place 1 drop into both eyes 2 (two) times daily., Disp: 0.4 mL, Rfl:  ?  Garlic 2376 MG CAPS, Take 1,000 mg by mouth daily. , Disp: , Rfl:  ?  Glycerin-Polysorbate 80 (REFRESH DRY EYE THERAPY OP), Place 1-2 drops into both eyes 4 (four) times daily as needed (for dry eyes). , Disp: , Rfl:  ?  ipratropium (ATROVENT) 0.06 % nasal spray, PLACE 2 SPRAYS IN EACH NOSTRIL 4 TIMES DAILY, Disp: 15 mL, Rfl: 1 ?  KRILL OIL OMEGA-3 PO, Take 1 capsule by mouth daily., Disp: , Rfl:  ?  letrozole (FEMARA) 2.5 MG tablet, Take 1 tablet (2.5 mg total) by mouth daily., Disp: 90 tablet, Rfl: 3 ?  Magnesium 250 MG TABS, Take 250 mg by mouth daily. , Disp: , Rfl:  ?  meloxicam (MOBIC) 15 MG tablet, Take 1 tablet (15 mg total) by mouth daily., Disp: 14 tablet,  Rfl: 0 ?  mometasone (ELOCON) 0.1 % cream, APPLY TO AFFECTED AREA DAILY, Disp: 45 g, Rfl: 1 ?  olopatadine (PATADAY) 0.1 % ophthalmic solution, Place 1 drop into both eyes 2 (two) times daily., Disp: 5 mL, Rfl: 12 ?  omeprazole (PRILOSEC) 20 MG capsule, TAKE 1 CAPSULE EVERY DAY, Disp: 90 capsule, Rfl: 1 ?  potassium gluconate 595 MG TABS, Take 595 mg by mouth daily., Disp: , Rfl:  ?  vitamin B-12 (CYANOCOBALAMIN) 1000 MCG tablet, Take 1,000 mcg by mouth daily., Disp: , Rfl:   ? ?Allergies  ?Allergen Reactions  ? Tobramycin Itching, Swelling and Rash  ?  Tobramycin eye drops caused swelling, itching, drainage, rash and peeling  of the skin  ?  ? ?Review of Systems  ?Constitutional: Negative.   ?Respiratory: Negative.    ?Cardiovascular: Negative.  Negative for chest pain, palpitations and leg swelling.  ?Gastrointestinal: Negative.   ?Musculoskeletal:  Positive for arthralgias (right shoulder, now having pain to her left shoulder not as bad as the right.). Negative for gait problem.  ?Neurological: Negative.  Negative for dizziness and headaches.  ?Psychiatric/Behavioral: Negative.     ? ?Today's Vitals  ? 10/27/21 1504  ?BP: 128/60  ?Pulse: 83  ?Temp: 98.6 ?F (37 ?C)  ?TempSrc: Oral  ?Weight: 157 lb 9.6 oz (71.5 kg)  ?Height: 5' 3" (1.6 m)  ? ?Body mass index is 27.92 kg/m?.  ?Wt Readings from Last 3 Encounters:  ?10/27/21 157 lb 9.6 oz (71.5 kg)  ?06/29/21 155 lb 9.6 oz (70.6 kg)  ?06/24/21 155 lb 9.6 oz (70.6 kg)  ? ? ?Objective:  ?Physical Exam ?Vitals reviewed.  ?Constitutional:   ?   General: She is not in acute distress. ?   Appearance: Normal appearance. She is well-developed.  ?   Comments: overweight  ?Eyes:  ?   Pupils: Pupils are equal, round, and reactive to light.  ?Cardiovascular:  ?   Rate and Rhythm: Normal rate and regular rhythm.  ?   Pulses: Normal pulses.  ?   Heart sounds: Normal heart sounds. No murmur heard. ?Pulmonary:  ?   Effort: Pulmonary effort is normal. No respiratory distress.  ?   Breath sounds: Normal breath sounds. No wheezing.  ?Musculoskeletal:     ?   General: No swelling or tenderness. Normal range of motion.  ?Skin: ?   General: Skin is warm and dry.  ?   Capillary Refill: Capillary refill takes less than 2 seconds.  ?Neurological:  ?   General: No focal deficit present.  ?   Mental Status: She is alert and oriented to person, place, and time.  ?   Cranial Nerves: No cranial nerve deficit.  ?   Motor: No weakness.  ?Psychiatric:     ?   Mood and Affect: Mood normal.     ?   Behavior: Behavior normal.     ?   Thought Content: Thought content normal.     ?   Judgment: Judgment normal.  ?  ? ?    ?Assessment And Plan:  ?   ?1. Essential hypertension ?Comments: Blood pressure is normal, continue current medications ?- BMP8+EGFR ? ?2. Abnormal glucose ?Comments: Stable continue current medications ?- Hemoglobin A1c ?- BMP8+EGFR ? ?3. Mixed hyperlipidemia ?Comments: Cholesterol levels were slightly worse, diet controlled. Will check lipid panel ?- Lipid panel ? ?4. Urine white blood cells increased ?Comments: She has small white cells in her urine, will send for culture ?- Culture, Urine ? ?5.  Malignant neoplasm of upper-inner quadrant of left breast in female, estrogen receptor positive ([place]) ?Comments: Continues with oral chemo medications ?  ? ? ?Patient was given opportunity to ask questions. Patient verbalized understanding of the plan and was able to repeat key elements of the plan. All questions were answered to their satisfaction.  ?[name], FNP  ? ?I, [name], FNP, have reviewed all documentation for this visit. The documentation on 10/27/21 for the exam, diagnosis, procedures, and orders are all accurate and complete.  ? ?IF YOU HAVE BEEN REFERRED TO A SPECIALIST, IT MAY TAKE 1-2 WEEKS TO SCHEDULE/PROCESS THE REFERRAL. IF YOU HAVE NOT HEARD FROM US/SPECIALIST IN TWO WEEKS, PLEASE GIVE [place] A CALL AT [phone].  ? ?THE PATIENT IS ENCOURAGED TO PRACTICE SOCIAL DISTANCING DUE TO THE COVID-19 PANDEMIC.   ?

## 2021-10-27 NOTE — Patient Instructions (Signed)

## 2021-10-28 ENCOUNTER — Other Ambulatory Visit: Payer: Self-pay | Admitting: Nurse Practitioner

## 2021-10-28 DIAGNOSIS — E782 Mixed hyperlipidemia: Secondary | ICD-10-CM

## 2021-10-28 LAB — LIPID PANEL
Chol/HDL Ratio: 3.6 ratio (ref 0.0–4.4)
Cholesterol, Total: 224 mg/dL — ABNORMAL HIGH (ref 100–199)
HDL: 62 mg/dL (ref >39)
LDL Chol Calc (NIH): 151 mg/dL — ABNORMAL HIGH (ref 0–99)
Triglycerides: 61 mg/dL (ref 0–149)
VLDL Cholesterol Cal: 11 mg/dL (ref 5–40)

## 2021-10-28 LAB — BMP8+EGFR
BUN/Creatinine Ratio: 18 (ref 12–28)
BUN: 14 mg/dL (ref 8–27)
CO2: 27 mmol/L (ref 20–29)
Calcium: 9.9 mg/dL (ref 8.7–10.3)
Chloride: 102 mmol/L (ref 96–106)
Creatinine, Ser: 0.77 mg/dL (ref 0.57–1.00)
Glucose: 98 mg/dL (ref 70–99)
Potassium: 4.5 mmol/L (ref 3.5–5.2)
Sodium: 142 mmol/L (ref 134–144)
eGFR: 76 mL/min/{1.73_m2} (ref >59)

## 2021-10-28 LAB — HEMOGLOBIN A1C
Est. average glucose Bld gHb Est-mCnc: 120 mg/dL
Hgb A1c MFr Bld: 5.8 % — ABNORMAL HIGH (ref 4.8–5.6)

## 2021-10-28 MED ORDER — ATORVASTATIN CALCIUM 10 MG PO TABS: 10.0000 mg | ORAL_TABLET | Freq: Every day | ORAL | 2 refills | Status: DC

## 2021-10-29 LAB — URINE CULTURE

## 2021-12-14 ENCOUNTER — Encounter (HOSPITAL_COMMUNITY): Payer: Self-pay

## 2021-12-14 ENCOUNTER — Ambulatory Visit (HOSPITAL_COMMUNITY)
Admission: EM | Admit: 2021-12-14 | Discharge: 2021-12-14 | Disposition: A | Discharge status: Home / Self Care | Payer: Medicare PPO | Attending: Family Medicine | Admitting: Family Medicine

## 2021-12-14 ENCOUNTER — Ambulatory Visit (INDEPENDENT_AMBULATORY_CARE_PROVIDER_SITE_OTHER): Payer: Medicare PPO

## 2021-12-14 DIAGNOSIS — R109 Unspecified abdominal pain: Secondary | ICD-10-CM | POA: Insufficient documentation

## 2021-12-14 DIAGNOSIS — M545 Low back pain, unspecified: Secondary | ICD-10-CM

## 2021-12-14 LAB — POCT URINALYSIS DIPSTICK, ED / UC
Bilirubin Urine: NEGATIVE
Glucose, UA: NEGATIVE mg/dL
Hgb urine dipstick: NEGATIVE
Ketones, ur: NEGATIVE mg/dL
Nitrite: NEGATIVE
Protein, ur: NEGATIVE mg/dL
Specific Gravity, Urine: 1.015 (ref 1.005–1.030)
Urobilinogen, UA: 1 mg/dL (ref 0.0–1.0)
pH: 5.5 (ref 5.0–8.0)

## 2021-12-14 MED ORDER — MELOXICAM 15 MG PO TABS: 15.0000 mg | ORAL_TABLET | Freq: Every day | ORAL | 0 refills | Status: DC | PRN

## 2021-12-14 NOTE — ED Provider Notes (Signed)
?[facility] ? ? ? ?CSN: [id number] ?Arrival date & time: 12/14/21  6979 ? ? ?  ? ?History   ?Chief Complaint ?Chief Complaint  ?Patient presents with  ? Back Pain  ? ? ?HPI ?Dawn Rodriguez is a 86 y.o. female.  ? ? ?Back Pain ?Here for low back pain around her waist.  It began about 4 days ago. ? ?Dates that she does not think it is arthritis, because usually for arthritis pain she can take a Tylenol, and it will go away. ? ?No trauma or fall. ? ?No urinary frequency or urgency or dysuria.  No hematuria.  She states that the urine is "puffy".  It is not cloudy.  Possibly it bubbly? ? ?Urine has been a little concentrated looking to her ? ?She does note that when she twists or bends her pain is worse. ? ?No fever or chills or nausea or vomiting ? ?No new URI symptoms. She has had ongoing cough; is a breast cancer survivor. ? ?Past Medical History:  ?Diagnosis Date  ? Arthritis   ? Bilateral dry eyes   ? Cancer ([place]) 02/2018  ? left breast cancer  ? Cataracts, bilateral   ? Chronic headache   ? GERD (gastroesophageal reflux disease)   ? Hypertension   ? Personal history of radiation therapy   ? TMJ syndrome   ? Vaginal discharge 11/12/2018  ? ? ?Patient Active Problem List  ? Diagnosis Date Noted  ? Encounter for Papanicolaou smear of cervix 11/12/2018  ? Malignant neoplasm of upper-inner quadrant of left breast in female, estrogen receptor positive ([place]) 03/20/2018  ? Cataracts, bilateral   ? GERD (gastroesophageal reflux disease)   ? OSTEOARTHRITIS 10/04/2007  ? INTERNAL HEMORRHOIDS 09/14/2007  ? DIVERTICULOSIS, SIGMOID COLON 09/14/2007  ? ? ?Past Surgical History:  ?Procedure Laterality Date  ? BREAST BIOPSY Right 2012  ? stereotatic biopsy  ? BREAST BIOPSY Left 03/14/2018  ? BREAST BIOPSY Right 03/19/2020  ? BREAST LUMPECTOMY Left 04/06/2018  ? Procedure: LEFT BREAST LUMPECTOMY;  Surgeon: Excell Seltzer, MD;  Location: [facility];  Service: General;  Laterality: Left;  ? CATARACT  EXTRACTION    ? ? ?OB History   ?No obstetric history on file. ?  ? ? ? ?Home Medications   ? ?Prior to Admission medications   ?Medication Sig Start Date End Date Taking? Authorizing Provider  ?ALPRAZolam (XANAX) 0.25 MG tablet Take 1 tablet (0.25 mg total) by mouth at bedtime as needed for anxiety. 01/12/21   [name], FNP  ?amLODipine (NORVASC) 2.5 MG tablet TAKE 1 TABLET EVERY DAY 09/22/21   [name], FNP  ?atorvastatin (LIPITOR) 10 MG tablet Take 1 tablet (10 mg total) by mouth daily. 10/28/21 10/28/22  [name], FNP  ?Calcium Carb-Cholecalciferol (CALCIUM-VITAMIN D3) 600-400 MG-UNIT TABS Take by mouth 2 (two) times daily.    [provider]  ?ciclopirox (PENLAC) 8 % solution Apply topically at bedtime. Apply over nail and surrounding skin. Apply daily over previous coat. After seven (7) days, may remove with alcohol and continue cycle. 01/12/21   [name], FNP  ?cycloSPORINE (RESTASIS) 0.05 % ophthalmic emulsion Place 1 drop into both eyes 2 (two) times daily. 12/31/20   Nicholas Lose, MD  ?Garlic 4801 MG CAPS Take 1,000 mg by mouth daily.     [provider]  ?Glycerin-Polysorbate 80 (REFRESH DRY EYE THERAPY OP) Place 1-2 drops into both eyes 4 (four) times daily as needed (for dry eyes).  [provider]  ?ipratropium (ATROVENT) 0.06 % nasal spray PLACE 2 SPRAYS IN EACH NOSTRIL 4 TIMES DAILY 02/12/20   [name], FNP  ?KRILL OIL OMEGA-3 PO Take 1 capsule by mouth daily.    [provider]  ?letrozole (FEMARA) 2.5 MG tablet Take 1 tablet (2.5 mg total) by mouth daily. 12/31/20   Nicholas Lose, MD  ?Magnesium 250 MG TABS Take 250 mg by mouth daily.     [provider]  ?meloxicam (MOBIC) 15 MG tablet Take 1 tablet (15 mg total) by mouth daily as needed for pain. 12/14/21   Barrett Henle, MD  ?mometasone (ELOCON) 0.1 % cream APPLY TO AFFECTED AREA DAILY 04/27/21   [name], FNP  ?olopatadine (PATADAY) 0.1 % ophthalmic solution Place 1 drop into  both eyes 2 (two) times daily. 12/31/20   Nicholas Lose, MD  ?omeprazole (PRILOSEC) 20 MG capsule TAKE 1 CAPSULE EVERY DAY 08/02/21   [name], FNP  ?potassium gluconate 595 MG TABS Take 595 mg by mouth daily.    [provider]  ?vitamin B-12 (CYANOCOBALAMIN) 1000 MCG tablet Take 1,000 mcg by mouth daily.    [provider]  ? ? ?Family History ?Family History  ?Problem Relation Age of Onset  ? Pneumonia Mother   ? ? ?Social History ?Social History  ? ?Tobacco Use  ? Smoking status: Never  ? Smokeless tobacco: Never  ?Vaping Use  ? Vaping Use: Never used  ?Substance Use Topics  ? Alcohol use: No  ? Drug use: No  ? ? ? ?Allergies   ?Tobramycin ? ? ?Review of Systems ?Review of Systems  ?Musculoskeletal:  Positive for back pain.  ? ? ?Physical Exam ?Triage Vital Signs ?ED Triage Vitals  ?Enc Vitals Group  ?   BP 12/14/21 0945 (!) 158/71  ?   Pulse Rate 12/14/21 0945 67  ?   Resp 12/14/21 0945 17  ?   Temp 12/14/21 0945 98.1 ?F (36.7 ?C)  ?   Temp Source 12/14/21 0945 Oral  ?   SpO2 12/14/21 0945 98 %  ?   Weight --   ?   Height --   ?   Head Circumference --   ?   Peak Flow --   ?   Pain Score 12/14/21 0946 5  ?   Pain Loc --   ?   Pain Edu? --   ?   Excl. in [place]? --   ? ?No data found. ? ?Updated Vital Signs ?BP (!) 158/71 (BP Location: Right Arm)   Pulse 67   Temp 98.1 ?F (36.7 ?C) (Oral)   Resp 17   SpO2 98%  ? ?Visual Acuity ?Right Eye Distance:   ?Left Eye Distance:   ?Bilateral Distance:   ? ?Right Eye Near:   ?Left Eye Near:    ?Bilateral Near:    ? ?Physical Exam ?Vitals reviewed.  ?Constitutional:   ?   General: She is not in acute distress. ?   Appearance: She is not ill-appearing, toxic-appearing or diaphoretic.  ?HENT:  ?   Mouth/Throat:  ?   Mouth: Mucous membranes are moist.  ?   Pharynx: No oropharyngeal exudate or posterior oropharyngeal erythema.  ?Eyes:  ?   Extraocular Movements: Extraocular movements intact.  ?   Conjunctiva/sclera: Conjunctivae normal.  ?   Pupils: Pupils  are equal, round, and reactive to light.  ?Cardiovascular:  ?   Rate and Rhythm: Normal rate and regular rhythm.  ?  Heart sounds: No murmur heard. ?Pulmonary:  ?   Effort: Pulmonary effort is normal.  ?   Breath sounds: Normal breath sounds.  ?Abdominal:  ?   Palpations: Abdomen is soft.  ?   Tenderness: There is no abdominal tenderness. There is no right CVA tenderness or left CVA tenderness.  ?Musculoskeletal:     ?   General: Tenderness (to digital palpation over LS area bilaterally. no rash) present.  ?   Cervical back: Neck supple.  ?Lymphadenopathy:  ?   Cervical: No cervical adenopathy.  ?Skin: ?   Coloration: Skin is not jaundiced or pale.  ?Neurological:  ?   Mental Status: She is alert.  ?Psychiatric:     ?   Behavior: Behavior normal.  ? ? ? ?UC Treatments / Results  ?Labs ?(all labs ordered are listed, but only abnormal results are displayed) ?Labs Reviewed  ?POCT URINALYSIS DIPSTICK, ED / UC - Abnormal; Notable for the following components:  ?    Result Value  ? Leukocytes,Ua SMALL (*)   ? All other components within normal limits  ? ? ?EKG ? ? ?Radiology ?DG Lumbar Spine 2-3 Views ? ?Result Date: 12/14/2021 ?CLINICAL DATA:  Low back pain for 2-3 days EXAM: LUMBAR SPINE - 2-3 VIEW COMPARISON:  12/12/2013 FINDINGS: Five lumbar type vertebrae. L2 superior endplate fracture is age indeterminate radiographically. Compared to the intact posterior body superior endplate depression is up to 45%. No evidence of retropulsion or bone lesion. Generalized disc space narrowing with mild scoliosis. Degenerative facet spurring. Atherosclerosis. IMPRESSION: Age indeterminate L2 superior endplate fracture with 01% height loss Electronically Signed   By: Jorje Guild M.D.   On: 12/14/2021 10:44   ? ?Procedures ?Procedures (including critical care time) ? ?Medications Ordered in UC ?Medications - No data to display ? ?Initial Impression / Assessment and Plan / UC Course  ?I have reviewed the triage vital signs and the  nursing notes. ? ?Pertinent labs & imaging results that were available during my care of the patient were reviewed by me and considered in my medical decision making (see chart for details). ? ?  ? ?X-

## 2021-12-14 NOTE — ED Triage Notes (Signed)
Pt present lower back pain x 2 to 3 days. Pt states no falls. ?

## 2021-12-14 NOTE — Discharge Instructions (Addendum)
The urinalysis showed a small amount of white blood cells.  Urine culture will be sent to to check for bladder infection. ? ?The x-rays showed some arthritis.  There also was an older compression fracture at L2; this is not in the area where you are hurting. ?

## 2021-12-15 LAB — URINE CULTURE

## 2021-12-17 NOTE — Progress Notes (Incomplete)
? ?Patient Care Team: ?[name], FNP as PCP - General (General Practice) ?Excell Seltzer, MD (Inactive) as Consulting Physician (General Surgery) ?Nicholas Lose, MD as Consulting Physician (Hematology and Oncology) ?Gery Pray, MD as Consulting Physician (Radiation Oncology) ? ?DIAGNOSIS: No diagnosis found. ? ?SUMMARY OF ONCOLOGIC HISTORY: ?Oncology History  ?Malignant neoplasm of upper-inner quadrant of left breast in female, estrogen receptor positive ([place])  ?03/14/2018 Initial Diagnosis  ? Screening detected left breast mass 2.3 cm at 9:30 position biopsy revealed invasive ductal carcinoma grade 2 ER 90%, PR 5%, Ki-67 5%, HER-2 negative ratio 1.19, axilla negative, T2N0 stage Ib clinical stage ? ?  ?03/21/2018 Cancer Staging  ? Staging form: Breast, AJCC 8th Edition ?- Clinical stage from 03/21/2018: Stage IB (cT2, cN0, cM0, G2, ER+, PR+, HER2-) - Signed by Nicholas Lose, MD on 03/21/2018 ? ?  ?04/06/2018 Surgery  ? Left lumpectomy: IDC with extravasated mucin, grade 2, 2.8 cm, intermediate grade DCIS, margins negative, ER 90%, PR 5%, HER-2 negative ratio 1.19, Ki-67 5%, T2N0 stage IA AJCC 8 ? ?  ?04/25/2018 Cancer Staging  ? Staging form: Breast, AJCC 8th Edition ?- Pathologic: Stage IA (pT2, pN0, cM0, G2, ER+, PR+, HER2-) - Signed by Gardenia Phlegm, NP on 04/25/2018 ? ?  ?05/16/2018 - 06/11/2018 Radiation Therapy  ? Adjuvant radiation therapy ? ?  ?07/2018 -  Anti-estrogen oral therapy  ? Letrozole daily  ? ?  ? ? ?CHIEF COMPLIANT:  Follow-up of left breast cancer on letrozole  ?  ? ?INTERVAL HISTORY: Dawn Rodriguez is a  86 y.o. with above-mentioned history of left breast cancer treated with lumpectomy, radiation, and who is currently on antiestrogen therapy with letrozole.  ? ? ?ALLERGIES:  is allergic to tobramycin. ? ?MEDICATIONS:  ?Current Outpatient Medications  ?Medication Sig Dispense Refill  ? ALPRAZolam (XANAX) 0.25 MG tablet Take 1 tablet (0.25 mg total) by mouth at bedtime as  needed for anxiety. 30 tablet 0  ? amLODipine (NORVASC) 2.5 MG tablet TAKE 1 TABLET EVERY DAY 90 tablet 1  ? atorvastatin (LIPITOR) 10 MG tablet Take 1 tablet (10 mg total) by mouth daily. 30 tablet 2  ? Calcium Carb-Cholecalciferol (CALCIUM-VITAMIN D3) 600-400 MG-UNIT TABS Take by mouth 2 (two) times daily.    ? ciclopirox (PENLAC) 8 % solution Apply topically at bedtime. Apply over nail and surrounding skin. Apply daily over previous coat. After seven (7) days, may remove with alcohol and continue cycle. 6.6 mL 0  ? cycloSPORINE (RESTASIS) 0.05 % ophthalmic emulsion Place 1 drop into both eyes 2 (two) times daily. 0.4 mL   ? Garlic 3500 MG CAPS Take 1,000 mg by mouth daily.     ? Glycerin-Polysorbate 80 (REFRESH DRY EYE THERAPY OP) Place 1-2 drops into both eyes 4 (four) times daily as needed (for dry eyes).     ? ipratropium (ATROVENT) 0.06 % nasal spray PLACE 2 SPRAYS IN EACH NOSTRIL 4 TIMES DAILY 15 mL 1  ? KRILL OIL OMEGA-3 PO Take 1 capsule by mouth daily.    ? letrozole (FEMARA) 2.5 MG tablet Take 1 tablet (2.5 mg total) by mouth daily. 90 tablet 3  ? Magnesium 250 MG TABS Take 250 mg by mouth daily.     ? meloxicam (MOBIC) 15 MG tablet Take 1 tablet (15 mg total) by mouth daily as needed for pain. 14 tablet 0  ? mometasone (ELOCON) 0.1 % cream APPLY TO AFFECTED AREA DAILY 45 g 1  ? olopatadine (PATADAY) 0.1 % ophthalmic solution Place  1 drop into both eyes 2 (two) times daily. 5 mL 12  ? omeprazole (PRILOSEC) 20 MG capsule TAKE 1 CAPSULE EVERY DAY 90 capsule 1  ? potassium gluconate 595 MG TABS Take 595 mg by mouth daily.    ? vitamin B-12 (CYANOCOBALAMIN) 1000 MCG tablet Take 1,000 mcg by mouth daily.    ? ?No current facility-administered medications for this visit.  ? ? ?PHYSICAL EXAMINATION: ?ECOG PERFORMANCE STATUS: {CHL ONC ECOG [id number]} ? ?There were no vitals filed for this visit. ?There were no vitals filed for this visit. ? ?BREAST:*** No palpable masses or nodules in either right or left  breasts. No palpable axillary supraclavicular or infraclavicular adenopathy no breast tenderness or nipple discharge. (exam performed in the presence of a chaperone) ? ?LABORATORY DATA:  ?I have reviewed the data as listed ? ?  Latest Ref Rng & Units 10/27/2021  ?  3:47 PM 06/29/2021  ?  4:39 PM 03/02/2021  ? 12:08 PM  ?CMP  ?Glucose 70 - 99 mg/dL 98   99   90    ?BUN 8 - 27 mg/dL _0 ?Creatinine 0.57 - 1.00 mg/dL 0.77   0.79   0.68    ?Sodium 134 - 144 mmol/L 142   143   143    ?Potassium 3.5 - 5.2 mmol/L 4.5   4.1   4.1    ?Chloride 96 - 106 mmol/L 102   105   104    ?CO2 20 - 29 mmol/L _1 ?Calcium 8.7 - 10.3 mg/dL 9.9   9.3   9.4    ?Total Protein 6.0 - 8.5 g/dL   6.8    ?Total Bilirubin 0.0 - 1.2 mg/dL   0.6    ?Alkaline Phos 44 - 121 IU/L   72    ?AST 0 - 40 IU/L   14    ?ALT 0 - 32 IU/L   12    ? ? ?Lab Results  ?Component Value Date  ? WBC 2.8 (L) 03/02/2021  ? HGB 11.6 03/02/2021  ? HCT 35.2 03/02/2021  ? MCV 87 03/02/2021  ? PLT 238 03/02/2021  ? NEUTROABS 1.5 03/21/2018  ? ? ?ASSESSMENT & PLAN:  ?No problem-specific Assessment & Plan notes found for this encounter. ? ? ? ?No orders of the defined types were placed in this encounter. ? ?The patient has a good understanding of the overall plan. she agrees with it. she will call with any problems that may develop before the next visit here. ?Total time spent: 30 mins including face to face time and time spent for planning, charting and co-ordination of care ? ? Suzzette Righter, CMA ?12/17/21 ? ? ? I Gardiner Coins am scribing for Dr. Lindi Adie ? ?***  ?

## 2021-12-31 ENCOUNTER — Inpatient Hospital Stay: Payer: Medicare PPO | Admitting: Hematology and Oncology

## 2022-01-31 ENCOUNTER — Other Ambulatory Visit: Payer: Self-pay | Admitting: Nurse Practitioner

## 2022-01-31 DIAGNOSIS — E782 Mixed hyperlipidemia: Secondary | ICD-10-CM

## 2022-02-01 NOTE — Progress Notes (Incomplete)
Patient Care Team: [name], FNP as PCP - General (General Practice) Excell Seltzer, MD (Inactive) as Consulting Physician (General Surgery) Nicholas Lose, MD as Consulting Physician (Hematology and Oncology) Gery Pray, MD as Consulting Physician (Radiation Oncology)  DIAGNOSIS: No diagnosis found.  SUMMARY OF ONCOLOGIC HISTORY: Oncology History  Malignant neoplasm of upper-inner quadrant of left breast in female, estrogen receptor positive ([place])  03/14/2018 Initial Diagnosis   Screening detected left breast mass 2.3 cm at 9:30 position biopsy revealed invasive ductal carcinoma grade 2 ER 90%, PR 5%, Ki-67 5%, HER-2 negative ratio 1.19, axilla negative, T2N0 stage Ib clinical stage   03/21/2018 Cancer Staging   Staging form: Breast, AJCC 8th Edition - Clinical stage from 03/21/2018: Stage IB (cT2, cN0, cM0, G2, ER+, PR+, HER2-) - Signed by Nicholas Lose, MD on 03/21/2018   04/06/2018 Surgery   Left lumpectomy: IDC with extravasated mucin, grade 2, 2.8 cm, intermediate grade DCIS, margins negative, ER 90%, PR 5%, HER-2 negative ratio 1.19, Ki-67 5%, T2N0 stage IA AJCC 8   04/25/2018 Cancer Staging   Staging form: Breast, AJCC 8th Edition - Pathologic: Stage IA (pT2, pN0, cM0, G2, ER+, PR+, HER2-) - Signed by Gardenia Phlegm, NP on 04/25/2018   05/16/2018 - 06/11/2018 Radiation Therapy   Adjuvant radiation therapy   07/2018 -  Anti-estrogen oral therapy   Letrozole daily      CHIEF COMPLIANT: Follow-up on Letrozole  INTERVAL HISTORY: Dawn Rodriguez is a  86 y.o. with above-mentioned history of left breast cancer currently on Letrozole. She presents to the clinic today for a follow-up.   ALLERGIES:  is allergic to tobramycin.  MEDICATIONS:  Current Outpatient Medications  Medication Sig Dispense Refill   ALPRAZolam (XANAX) 0.25 MG tablet Take 1 tablet (0.25 mg total) by mouth at bedtime as needed for anxiety. 30 tablet 0   amLODipine (NORVASC) 2.5 MG  tablet TAKE 1 TABLET EVERY DAY 90 tablet 1   atorvastatin (LIPITOR) 10 MG tablet TAKE 1 TABLET BY MOUTH EVERY DAY 90 tablet 1   Calcium Carb-Cholecalciferol (CALCIUM-VITAMIN D3) 600-400 MG-UNIT TABS Take by mouth 2 (two) times daily.     ciclopirox (PENLAC) 8 % solution Apply topically at bedtime. Apply over nail and surrounding skin. Apply daily over previous coat. After seven (7) days, may remove with alcohol and continue cycle. 6.6 mL 0   cycloSPORINE (RESTASIS) 0.05 % ophthalmic emulsion Place 1 drop into both eyes 2 (two) times daily. 0.4 mL    Garlic 6256 MG CAPS Take 1,000 mg by mouth daily.      Glycerin-Polysorbate 80 (REFRESH DRY EYE THERAPY OP) Place 1-2 drops into both eyes 4 (four) times daily as needed (for dry eyes).      ipratropium (ATROVENT) 0.06 % nasal spray PLACE 2 SPRAYS IN EACH NOSTRIL 4 TIMES DAILY 15 mL 1   KRILL OIL OMEGA-3 PO Take 1 capsule by mouth daily.     letrozole (FEMARA) 2.5 MG tablet Take 1 tablet (2.5 mg total) by mouth daily. 90 tablet 3   Magnesium 250 MG TABS Take 250 mg by mouth daily.      meloxicam (MOBIC) 15 MG tablet Take 1 tablet (15 mg total) by mouth daily as needed for pain. 14 tablet 0   mometasone (ELOCON) 0.1 % cream APPLY TO AFFECTED AREA DAILY 45 g 1   olopatadine (PATADAY) 0.1 % ophthalmic solution Place 1 drop into both eyes 2 (two) times daily. 5 mL 12   omeprazole (PRILOSEC) 20 MG  capsule TAKE 1 CAPSULE EVERY DAY 90 capsule 1   potassium gluconate 595 MG TABS Take 595 mg by mouth daily.     vitamin B-12 (CYANOCOBALAMIN) 1000 MCG tablet Take 1,000 mcg by mouth daily.     No current facility-administered medications for this visit.    PHYSICAL EXAMINATION: ECOG PERFORMANCE STATUS: {CHL ONC ECOG PS:[id number]}  There were no vitals filed for this visit. There were no vitals filed for this visit.  BREAST:*** No palpable masses or nodules in either right or left breasts. No palpable axillary supraclavicular or infraclavicular adenopathy  no breast tenderness or nipple discharge. (exam performed in the presence of a chaperone)  LABORATORY DATA:  I have reviewed the data as listed    Latest Ref Rng & Units 10/27/2021    3:47 PM 06/29/2021    4:39 PM 03/02/2021   12:08 PM  CMP  Glucose 70 - 99 mg/dL 98  99  90   BUN 8 - 27 mg/dL _0 Creatinine 0.57 - 1.00 mg/dL 0.77  0.79  0.68   Sodium 134 - 144 mmol/L 142  143  143   Potassium 3.5 - 5.2 mmol/L 4.5  4.1  4.1   Chloride 96 - 106 mmol/L 102  105  104   CO2 20 - 29 mmol/L _1 Calcium 8.7 - 10.3 mg/dL 9.9  9.3  9.4   Total Protein 6.0 - 8.5 g/dL   6.8   Total Bilirubin 0.0 - 1.2 mg/dL   0.6   Alkaline Phos 44 - 121 IU/L   72   AST 0 - 40 IU/L   14   ALT 0 - 32 IU/L   12     Lab Results  Component Value Date   WBC 2.8 (L) 03/02/2021   HGB 11.6 03/02/2021   HCT 35.2 03/02/2021   MCV 87 03/02/2021   PLT 238 03/02/2021   NEUTROABS 1.5 03/21/2018    ASSESSMENT & PLAN:  No problem-specific Assessment & Plan notes found for this encounter.    No orders of the defined types were placed in this encounter.  The patient has a good understanding of the overall plan. she agrees with it. she will call with any problems that may develop before the next visit here. Total time spent: 30 mins including face to face time and time spent for planning, charting and co-ordination of care   Suzzette Righter, [place] 02/01/22    I Gardiner Coins am scribing for Dr. Lindi Adie  ***

## 2022-02-15 ENCOUNTER — Other Ambulatory Visit: Payer: Self-pay

## 2022-02-15 ENCOUNTER — Inpatient Hospital Stay: Payer: Medicare PPO | Attending: Hematology and Oncology | Admitting: Hematology and Oncology

## 2022-02-15 DIAGNOSIS — Z79811 Long term (current) use of aromatase inhibitors: Secondary | ICD-10-CM | POA: Insufficient documentation

## 2022-02-15 DIAGNOSIS — Z923 Personal history of irradiation: Secondary | ICD-10-CM | POA: Diagnosis not present

## 2022-02-15 DIAGNOSIS — C50212 Malignant neoplasm of upper-inner quadrant of left female breast: Secondary | ICD-10-CM | POA: Diagnosis not present

## 2022-02-15 DIAGNOSIS — Z79899 Other long term (current) drug therapy: Secondary | ICD-10-CM | POA: Diagnosis not present

## 2022-02-15 DIAGNOSIS — Z79621 Long term (current) use of calcineurin inhibitor: Secondary | ICD-10-CM | POA: Diagnosis not present

## 2022-02-15 DIAGNOSIS — Z7951 Long term (current) use of inhaled steroids: Secondary | ICD-10-CM | POA: Diagnosis not present

## 2022-02-15 DIAGNOSIS — Z170 Estrogen receptor positive status [ER+]: Secondary | ICD-10-CM | POA: Diagnosis not present

## 2022-02-15 MED ORDER — LETROZOLE 2.5 MG PO TABS: 2.5000 mg | ORAL_TABLET | Freq: Every day | ORAL | 3 refills | Status: DC

## 2022-02-15 NOTE — Assessment & Plan Note (Addendum)
04/06/2018:Left lumpectomy: IDC with extravasated mucin, grade 2, 2.8 cm, intermediate grade DCIS, margins negative, ER 90%, PR 5%, HER-2 negative ratio 1.19, Ki-67 5%, T2N0 stage IA AJCC 8 Adjuvant radiation therapy 05/16/2018 to 06/11/2018  Treatment plan: Letrozole 2.5 mg daily started 07/16/2018  Letrozoletoxicities:Denies any hot flashes or arthralgias or myalgias. She has trouble with insomnia. She is experimenting with taking the medication at different times of the day.  Osteoporosis: T score -2.9:onFosamax. She takes calcium and vitamin D.  Right breast discomfort: 03/12/2020: Mammogram and ultrasound: Indeterminate calcifications 1.5 cm right breast: Biopsy: Fibroadenomatoid change.  Breast cancer surveillance: 1.  Mammogram 01/27/21: Benign, density cat C 2.  Breast exam 02/15/22: Benign  Return to clinic in 1 year for follow-up.

## 2022-03-01 ENCOUNTER — Encounter: Payer: Self-pay | Admitting: Nurse Practitioner

## 2022-03-01 ENCOUNTER — Ambulatory Visit (INDEPENDENT_AMBULATORY_CARE_PROVIDER_SITE_OTHER): Payer: Medicare PPO | Admitting: Nurse Practitioner

## 2022-03-01 VITALS — BP 120/80 | HR 65 | Temp 98.1°F | Ht 63.0 in | Wt 159.0 lb

## 2022-03-01 DIAGNOSIS — Z6828 Body mass index (BMI) 28.0-28.9, adult: Secondary | ICD-10-CM | POA: Diagnosis not present

## 2022-03-01 DIAGNOSIS — R21 Rash and other nonspecific skin eruption: Secondary | ICD-10-CM | POA: Diagnosis not present

## 2022-03-01 DIAGNOSIS — G8929 Other chronic pain: Secondary | ICD-10-CM | POA: Diagnosis not present

## 2022-03-01 DIAGNOSIS — M25511 Pain in right shoulder: Secondary | ICD-10-CM

## 2022-03-01 DIAGNOSIS — E782 Mixed hyperlipidemia: Secondary | ICD-10-CM

## 2022-03-01 DIAGNOSIS — R7309 Other abnormal glucose: Secondary | ICD-10-CM

## 2022-03-01 DIAGNOSIS — I10 Essential (primary) hypertension: Secondary | ICD-10-CM | POA: Diagnosis not present

## 2022-03-01 MED ORDER — MELOXICAM 15 MG PO TABS: 15.0000 mg | ORAL_TABLET | Freq: Every day | ORAL | 0 refills | Status: DC | PRN

## 2022-03-01 NOTE — Progress Notes (Signed)
I,Victoria T Hamilton,acting as a Education administrator for [name], FNP.,have documented all relevant documentation on the behalf of [name], FNP,as directed by  [name], FNP while in the presence of [name], [employer].  Subjective:     Patient ID: Dawn Rodriguez , female    DOB: 11/20/1935 , 86 y.o.   MRN: [id number]   Chief Complaint  Patient presents with   Hypertension   Hyperlipidemia    HPI  Patient presents today for a f/u on her bp, hyperlipidemia and abnormal glucose.  She does admit to eating more pasta prior to her last visit. She is trying to be more aware of her food labels. She has not had her grandchildren as much during the summer  She had a cortisone injection to right shoulder for arthritis.she is using an over the counter pain cream. She was also given meloxicam for pain as needed.   She has seen Oncology and is to make an appt for a mammogram. She is to have a colonoscopy due to family history of colon tumor. She has had a polyp in the past that had an increased risk for cancer. She is to see Dr. Collene Mares. She will call to schedule an appt for both mammogram and colonoscopy.   Hypertension This is a chronic problem. The current episode started more than 1 year ago. The problem is unchanged. Pertinent negatives include no chest pain, headaches or palpitations. Risk factors for coronary artery disease include dyslipidemia. Past treatments include calcium channel blockers. There are no compliance problems.  There is no history of angina.  Hyperlipidemia Pertinent negatives include no chest pain.     Past Medical History:  Diagnosis Date   Arthritis    Bilateral dry eyes    Cancer ([facility]) 02/2018   left breast cancer   Cataracts, bilateral    Chronic headache    GERD (gastroesophageal reflux disease)    Hypertension    Personal history of radiation therapy    TMJ syndrome    Vaginal discharge 11/12/2018     Family History  Problem Relation Age of Onset    Pneumonia Mother      Current Outpatient Medications:    ALPRAZolam (XANAX) 0.25 MG tablet, Take 1 tablet (0.25 mg total) by mouth at bedtime as needed for anxiety., Disp: 30 tablet, Rfl: 0   amLODipine (NORVASC) 2.5 MG tablet, TAKE 1 TABLET EVERY DAY, Disp: 90 tablet, Rfl: 1   atorvastatin (LIPITOR) 10 MG tablet, TAKE 1 TABLET BY MOUTH EVERY DAY, Disp: 90 tablet, Rfl: 1   Calcium Carb-Cholecalciferol (CALCIUM-VITAMIN D3) 600-400 MG-UNIT TABS, Take by mouth 2 (two) times daily., Disp: , Rfl:    ciclopirox (PENLAC) 8 % solution, Apply topically at bedtime. Apply over nail and surrounding skin. Apply daily over previous coat. After seven (7) days, may remove with alcohol and continue cycle., Disp: 6.6 mL, Rfl: 0   cycloSPORINE (RESTASIS) 0.05 % ophthalmic emulsion, Place 1 drop into both eyes 2 (two) times daily., Disp: 0.4 mL, Rfl:    Garlic 8882 MG CAPS, Take 1,000 mg by mouth daily. , Disp: , Rfl:    Glycerin-Polysorbate 80 (REFRESH DRY EYE THERAPY OP), Place 1-2 drops into both eyes 4 (four) times daily as needed (for dry eyes). , Disp: , Rfl:    KRILL OIL OMEGA-3 PO, Take 1 capsule by mouth daily., Disp: , Rfl:    letrozole (FEMARA) 2.5 MG tablet, Take 1 tablet (2.5 mg total) by mouth daily., Disp: 90 tablet, Rfl:  3   Magnesium 250 MG TABS, Take 250 mg by mouth daily. , Disp: , Rfl:    mometasone (ELOCON) 0.1 % cream, APPLY TO AFFECTED AREA DAILY, Disp: 45 g, Rfl: 1   olopatadine (PATADAY) 0.1 % ophthalmic solution, Place 1 drop into both eyes 2 (two) times daily., Disp: 5 mL, Rfl: 12   omeprazole (PRILOSEC) 20 MG capsule, TAKE 1 CAPSULE EVERY DAY, Disp: 90 capsule, Rfl: 1   potassium gluconate 595 MG TABS, Take 595 mg by mouth daily., Disp: , Rfl:    vitamin B-12 (CYANOCOBALAMIN) 1000 MCG tablet, Take 1,000 mcg by mouth daily., Disp: , Rfl:    ipratropium (ATROVENT) 0.06 % nasal spray, PLACE 2 SPRAYS IN EACH NOSTRIL 4 TIMES DAILY (Patient not taking: Reported on 03/01/2022), Disp: 15 mL, Rfl:  1   meloxicam (MOBIC) 15 MG tablet, Take 1 tablet (15 mg total) by mouth daily as needed for pain., Disp: 30 tablet, Rfl: 0   Allergies  Allergen Reactions   Tobramycin Itching, Swelling and Rash    Tobramycin eye drops caused swelling, itching, drainage, rash and peeling of the skin     Review of Systems  Constitutional: Negative.   Respiratory: Negative.    Cardiovascular: Negative.  Negative for chest pain, palpitations and leg swelling.  Skin:  Positive for rash (both ankles).  Neurological: Negative.  Negative for headaches.  Psychiatric/Behavioral: Negative.       Today's Vitals   03/01/22 1425  BP: 118/90  Pulse: 65  Temp: 98.1 F (36.7 C)  SpO2: 96%  Weight: 159 lb (72.1 kg)  Height: _0  (1.6 m)  PainSc: 0-No pain   Body mass index is 28.17 kg/m.  Wt Readings from Last 3 Encounters:  03/01/22 159 lb (72.1 kg)  02/15/22 156 lb 11.2 oz (71.1 kg)  10/27/21 157 lb 9.6 oz (71.5 kg)    Objective:  Physical Exam Vitals reviewed.  Constitutional:      General: She is not in acute distress.    Appearance: Normal appearance. She is well-developed.     Comments: overweight  Eyes:     Pupils: Pupils are equal, round, and reactive to light.  Cardiovascular:     Rate and Rhythm: Normal rate and regular rhythm.     Pulses: Normal pulses.     Heart sounds: Normal heart sounds. No murmur heard. Pulmonary:     Effort: Pulmonary effort is normal. No respiratory distress.     Breath sounds: Normal breath sounds. No wheezing.  Musculoskeletal:        General: No swelling or tenderness. Normal range of motion.  Skin:    General: Skin is warm and dry.     Capillary Refill: Capillary refill takes less than 2 seconds.     Findings: Rash (scattered hyperpigmented skin to anterior ankles) present.  Neurological:     General: No focal deficit present.     Mental Status: She is alert and oriented to person, place, and time.     Cranial Nerves: No cranial nerve deficit.      Motor: No weakness.  Psychiatric:        Mood and Affect: Mood normal.        Behavior: Behavior normal.        Thought Content: Thought content normal.        Judgment: Judgment normal.         Assessment And Plan:     1. Essential hypertension Comments: Blood pressure is fairly controlled, diastolic  is slightly elevated.  - Lipid panel - Hemoglobin A1c - BMP8+eGFR  2. Mixed hyperlipidemia Comments: Stable, continue statin. Tolerating well.  - Lipid panel - Hemoglobin A1c - BMP8+eGFR  3. Abnormal glucose Comments: HgbA1c was 5.8, this is good for her age.  - Lipid panel - Hemoglobin A1c - BMP8+eGFR  4. BMI 28.0-28.9,adult  5. Chronic right shoulder pain Comments: Limited supply of meloxicam. Encouraged to continue with pain cream as needed - meloxicam (MOBIC) 15 MG tablet; Take 1 tablet (15 mg total) by mouth daily as needed for pain.  Dispense: 30 tablet; Refill: 0  6. Rash and nonspecific skin eruption Comments: scattered hyperpigmented skin to bilateral anterior ankles. Continue using cream, likely eczema vs dermattitis   Reviewed her labs from last visit on the letter we sent to her due to not being able to reach by phone.   Patient was given opportunity to ask questions. Patient verbalized understanding of the plan and was able to repeat key elements of the plan. All questions were answered to their satisfaction.  [name], FNP   I, [name], FNP, have reviewed all documentation for this visit. The documentation on 03/01/22 for the exam, diagnosis, procedures, and orders are all accurate and complete.   IF YOU HAVE BEEN REFERRED TO A SPECIALIST, IT MAY TAKE 1-2 WEEKS TO SCHEDULE/PROCESS THE REFERRAL. IF YOU HAVE NOT HEARD FROM US/SPECIALIST IN TWO WEEKS, PLEASE GIVE [place] A CALL AT [phone] X [phone].   THE PATIENT IS ENCOURAGED TO PRACTICE SOCIAL DISTANCING DUE TO THE COVID-19 PANDEMIC.

## 2022-03-01 NOTE — Patient Instructions (Addendum)
Hypertension, Adult Hypertension is another name for high blood pressure. High blood pressure forces your heart to work harder to pump blood. This can cause problems over time. There are two numbers in a blood pressure reading. There is a top number (systolic) over a bottom number (diastolic). It is best to have a blood pressure that is below 120/80. What are the causes? The cause of this condition is not known. Some other conditions can lead to high blood pressure. What increases the risk? Some lifestyle factors can make you more likely to develop high blood pressure: Smoking. Not getting enough exercise or physical activity. Being overweight. Having too much fat, sugar, calories, or salt (sodium) in your diet. Drinking too much alcohol. Other risk factors include: Having any of these conditions: Heart disease. Diabetes. High cholesterol. Kidney disease. Obstructive sleep apnea. Having a family history of high blood pressure and high cholesterol. Age. The risk increases with age. Stress. What are the signs or symptoms? High blood pressure may not cause symptoms. Very high blood pressure (hypertensive crisis) may cause: Headache. Fast or uneven heartbeats (palpitations). Shortness of breath. Nosebleed. Vomiting or feeling like you may vomit (nauseous). Changes in how you see. Very bad chest pain. Feeling dizzy. Seizures. How is this treated? This condition is treated by making healthy lifestyle changes, such as: Eating healthy foods. Exercising more. Drinking less alcohol. Your doctor may prescribe medicine if lifestyle changes do not help enough and if: Your top number is above 130. Your bottom number is above 80. Your personal target blood pressure may vary. Follow these instructions at home: Eating and drinking  If told, follow the DASH eating plan. To follow this plan: Fill one half of your plate at each meal with fruits and vegetables. Fill one fourth of your plate  at each meal with whole grains. Whole grains include whole-wheat pasta, brown rice, and whole-grain bread. Eat or drink low-fat dairy products, such as skim milk or low-fat yogurt. Fill one fourth of your plate at each meal with low-fat (lean) proteins. Low-fat proteins include fish, chicken without skin, eggs, beans, and tofu. Avoid fatty meat, cured and processed meat, or chicken with skin. Avoid pre-made or processed food. Limit the amount of salt in your diet to less than 1,500 mg each day. Do not drink alcohol if: Your doctor tells you not to drink. You are pregnant, may be pregnant, or are planning to become pregnant. If you drink alcohol: Limit how much you have to: 0-1 drink a day for women. 0-2 drinks a day for men. Know how much alcohol is in your drink. In the [place] one drink equals one 12 oz bottle of beer (355 mL), one 5 oz glass of wine (148 mL), or one 1 oz glass of hard liquor (44 mL). Lifestyle  Work with your doctor to stay at a healthy weight or to lose weight. Ask your doctor what the best weight is for you. Get at least 30 minutes of exercise that causes your heart to beat faster (aerobic exercise) most days of the week. This may include walking, swimming, or biking. Get at least 30 minutes of exercise that strengthens your muscles (resistance exercise) at least 3 days a week. This may include lifting weights or doing Pilates. Do not smoke or use any products that contain nicotine or tobacco. If you need help quitting, ask your doctor. Check your blood pressure at home as told by your doctor. Keep all follow-up visits. Medicines Take over-the-counter and prescription medicines  only as told by your doctor. Follow directions carefully. Do not skip doses of blood pressure medicine. The medicine does not work as well if you skip doses. Skipping doses also puts you at risk for problems. Ask your doctor about side effects or reactions to medicines that you should watch  for. Contact a doctor if: You think you are having a reaction to the medicine you are taking. You have headaches that keep coming back. You feel dizzy. You have swelling in your ankles. You have trouble with your vision. Get help right away if: You get a very bad headache. You start to feel mixed up (confused). You feel weak or numb. You feel faint. You have very bad pain in your: Chest. Belly (abdomen). You vomit more than once. You have trouble breathing. These symptoms may be an emergency. Get help right away. Call 911. Do not wait to see if the symptoms will go away. Do not drive yourself to the hospital. Summary Hypertension is another name for high blood pressure. High blood pressure forces your heart to work harder to pump blood. For most people, a normal blood pressure is less than 120/80. Making healthy choices can help lower blood pressure. If your blood pressure does not get lower with healthy choices, you may need to take medicine. This information is not intended to replace advice given to you by your health care provider. Make sure you discuss any questions you have with your health care provider. Document Revised: 05/27/2021 Document Reviewed: 05/27/2021 Elsevier Patient Education  [place].   Prediabetes Eating Plan Prediabetes is a condition that causes blood sugar (glucose) levels to be higher than normal. This increases the risk for developing type 2 diabetes (type 2 diabetes mellitus). Working with a health care provider or nutrition specialist (dietitian) to make diet and lifestyle changes can help prevent the onset of diabetes. These changes may help you: Control your blood glucose levels. Improve your cholesterol levels. Manage your blood pressure. What are tips for following this plan? Reading food labels Read food labels to check the amount of fat, salt (sodium), and sugar in prepackaged foods. Avoid foods that have: Saturated fats. Trans  fats. Added sugars. Avoid foods that have more than 300 milligrams (mg) of sodium per serving. Limit your sodium intake to less than 2,300 mg each day. Shopping Avoid buying pre-made and processed foods. Avoid buying drinks with added sugar. Cooking Cook with olive oil. Do not use butter, lard, or ghee. Bake, broil, grill, steam, or boil foods. Avoid frying. Meal planning  Work with your dietitian to create an eating plan that is right for you. This may include tracking how many calories you take in each day. Use a food diary, notebook, or mobile application to track what you eat at each meal. Consider following a Mediterranean diet. This includes: Eating several servings of fresh fruits and vegetables each day. Eating fish at least twice a week. Eating one serving each day of whole grains, beans, nuts, and seeds. Using olive oil instead of other fats. Limiting alcohol. Limiting red meat. Using nonfat or low-fat dairy products. Consider following a plant-based diet. This includes dietary choices that focus on eating mostly vegetables and fruit, grains, beans, nuts, and seeds. If you have high blood pressure, you may need to limit your sodium intake or follow a diet such as the DASH (Dietary Approaches to Stop Hypertension) eating plan. The DASH diet aims to lower high blood pressure. Lifestyle Set weight loss goals with  help from your health care team. It is recommended that most people with prediabetes lose 7% of their body weight. Exercise for at least 30 minutes 5 or more days a week. Attend a support group or seek support from a mental health counselor. Take over-the-counter and prescription medicines only as told by your health care provider. What foods are recommended? Fruits Berries. Bananas. Apples. Oranges. Grapes. Papaya. Mango. Pomegranate. Kiwi. Grapefruit. Cherries. Vegetables Lettuce. Spinach. Peas. Beets. Cauliflower. Cabbage. Broccoli. Carrots. Tomatoes. Squash.  Eggplant. Herbs. Peppers. Onions. Cucumbers. Brussels sprouts. Grains Whole grains, such as whole-wheat or whole-grain breads, crackers, cereals, and pasta. Unsweetened oatmeal. Bulgur. Barley. Quinoa. Brown rice. Corn or whole-wheat flour tortillas or taco shells. Meats and other proteins Seafood. Poultry without skin. Lean cuts of pork and beef. Tofu. Eggs. Nuts. Beans. Dairy Low-fat or fat-free dairy products, such as yogurt, cottage cheese, and cheese. Beverages Water. Tea. Coffee. Sugar-free or diet soda. Seltzer water. Low-fat or nonfat milk. Milk alternatives, such as soy or almond milk. Fats and oils Olive oil. Canola oil. Sunflower oil. Grapeseed oil. Avocado. Walnuts. Sweets and desserts Sugar-free or low-fat pudding. Sugar-free or low-fat ice cream and other frozen treats. Seasonings and condiments Herbs. Sodium-free spices. Mustard. Relish. Low-salt, low-sugar ketchup. Low-salt, low-sugar barbecue sauce. Low-fat or fat-free mayonnaise. The items listed above may not be a complete list of recommended foods and beverages. Contact a dietitian for more information. What foods are not recommended? Fruits Fruits canned with syrup. Vegetables Canned vegetables. Frozen vegetables with butter or cream sauce. Grains Refined white flour and flour products, such as bread, pasta, snack foods, and cereals. Meats and other proteins Fatty cuts of meat. Poultry with skin. Breaded or fried meat. Processed meats. Dairy Full-fat yogurt, cheese, or milk. Beverages Sweetened drinks, such as iced tea and soda. Fats and oils Butter. Lard. Ghee. Sweets and desserts Baked goods, such as cake, cupcakes, pastries, cookies, and cheesecake. Seasonings and condiments Spice mixes with added salt. Ketchup. Barbecue sauce. Mayonnaise. The items listed above may not be a complete list of foods and beverages that are not recommended. Contact a dietitian for more information. Where to find more  information American Diabetes Association: www.diabetes.org Summary You may need to make diet and lifestyle changes to help prevent the onset of diabetes. These changes can help you control blood sugar, improve cholesterol levels, and manage blood pressure. Set weight loss goals with help from your health care team. It is recommended that most people with prediabetes lose 7% of their body weight. Consider following a Mediterranean diet. This includes eating plenty of fresh fruits and vegetables, whole grains, beans, nuts, seeds, fish, and low-fat dairy, and using olive oil instead of other fats. This information is not intended to replace advice given to you by your health care provider. Make sure you discuss any questions you have with your health care provider. Document Revised: 11/07/2019 Document Reviewed: 11/07/2019 Elsevier Patient Education  [place].

## 2022-03-02 LAB — BMP8+EGFR
BUN/Creatinine Ratio: 13 (ref 12–28)
BUN: 12 mg/dL (ref 8–27)
CO2: 25 mmol/L (ref 20–29)
Calcium: 9.4 mg/dL (ref 8.7–10.3)
Chloride: 104 mmol/L (ref 96–106)
Creatinine, Ser: 0.89 mg/dL (ref 0.57–1.00)
Glucose: 92 mg/dL (ref 70–99)
Potassium: 4.3 mmol/L (ref 3.5–5.2)
Sodium: 142 mmol/L (ref 134–144)
eGFR: 64 mL/min/{1.73_m2} (ref >59)

## 2022-03-02 LAB — LIPID PANEL
Chol/HDL Ratio: 3.1 ratio (ref 0.0–4.4)
Cholesterol, Total: 195 mg/dL (ref 100–199)
HDL: 62 mg/dL (ref >39)
LDL Chol Calc (NIH): 123 mg/dL — ABNORMAL HIGH (ref 0–99)
Triglycerides: 52 mg/dL (ref 0–149)
VLDL Cholesterol Cal: 10 mg/dL (ref 5–40)

## 2022-03-02 LAB — HEMOGLOBIN A1C
Est. average glucose Bld gHb Est-mCnc: 117 mg/dL
Hgb A1c MFr Bld: 5.7 % — ABNORMAL HIGH (ref 4.8–5.6)

## 2022-04-08 DIAGNOSIS — Z961 Presence of intraocular lens: Secondary | ICD-10-CM | POA: Diagnosis not present

## 2022-04-08 DIAGNOSIS — H1045 Other chronic allergic conjunctivitis: Secondary | ICD-10-CM | POA: Diagnosis not present

## 2022-04-08 DIAGNOSIS — H18513 Endothelial corneal dystrophy, bilateral: Secondary | ICD-10-CM | POA: Diagnosis not present

## 2022-04-08 DIAGNOSIS — H40013 Open angle with borderline findings, low risk, bilateral: Secondary | ICD-10-CM | POA: Diagnosis not present

## 2022-04-11 ENCOUNTER — Other Ambulatory Visit: Payer: Self-pay | Admitting: Hematology and Oncology

## 2022-04-11 DIAGNOSIS — Z1231 Encounter for screening mammogram for malignant neoplasm of breast: Secondary | ICD-10-CM

## 2022-04-29 ENCOUNTER — Ambulatory Visit
Admission: RE | Admit: 2022-04-29 | Discharge: 2022-04-29 | Disposition: A | Discharge status: Home / Self Care | Payer: Medicare PPO | Source: Ambulatory Visit | Attending: Hematology and Oncology | Admitting: Hematology and Oncology

## 2022-04-29 DIAGNOSIS — Z1231 Encounter for screening mammogram for malignant neoplasm of breast: Secondary | ICD-10-CM | POA: Diagnosis not present

## 2022-04-30 ENCOUNTER — Other Ambulatory Visit: Payer: Self-pay | Admitting: Nurse Practitioner

## 2022-06-16 ENCOUNTER — Telehealth: Payer: Self-pay | Admitting: *Deleted

## 2022-06-16 NOTE — Telephone Encounter (Signed)
Received call from pt with complaint of ongoing bilateral leg cramping, worse at night as well as generalized pruritus.  Per MD pt needing to stop Anastrozole x2 weeks and f/u in office.  Pt educated and verbalized understanding.

## 2022-06-16 NOTE — Telephone Encounter (Signed)
Received VM from pt.  RN attempt x1 to return call.  No answer.  Left VM for pt to return call to the office.

## 2022-07-02 NOTE — Progress Notes (Signed)
Patient Care Team: [name], FNP as PCP - General (General Practice) Excell Seltzer, MD (Inactive) as Consulting Physician (General Surgery) Nicholas Lose, MD as Consulting Physician (Hematology and Oncology) Gery Pray, MD as Consulting Physician (Radiation Oncology)  DIAGNOSIS: No diagnosis found.  SUMMARY OF ONCOLOGIC HISTORY: Oncology History  Malignant neoplasm of upper-inner quadrant of left breast in female, estrogen receptor positive ([place])  03/14/2018 Initial Diagnosis   Screening detected left breast mass 2.3 cm at 9:30 position biopsy revealed invasive ductal carcinoma grade 2 ER 90%, PR 5%, Ki-67 5%, HER-2 negative ratio 1.19, axilla negative, T2N0 stage Ib clinical stage   03/21/2018 Cancer Staging   Staging form: Breast, AJCC 8th Edition - Clinical stage from 03/21/2018: Stage IB (cT2, cN0, cM0, G2, ER+, PR+, HER2-) - Signed by Nicholas Lose, MD on 03/21/2018   04/06/2018 Surgery   Left lumpectomy: IDC with extravasated mucin, grade 2, 2.8 cm, intermediate grade DCIS, margins negative, ER 90%, PR 5%, HER-2 negative ratio 1.19, Ki-67 5%, T2N0 stage IA AJCC 8   04/25/2018 Cancer Staging   Staging form: Breast, AJCC 8th Edition - Pathologic: Stage IA (pT2, pN0, cM0, G2, ER+, PR+, HER2-) - Signed by Gardenia Phlegm, NP on 04/25/2018   05/16/2018 - 06/11/2018 Radiation Therapy   Adjuvant radiation therapy   07/2018 -  Anti-estrogen oral therapy   Letrozole daily      CHIEF COMPLIANT:  Follow-up left breast cancer on Letrozole   INTERVAL HISTORY: Dawn Rodriguez is a 86 y.o. with above-mentioned history of left breast cancer currently on Letrozole. She presents to the clinic today for a follow-up.    ALLERGIES:  is allergic to tobramycin.  MEDICATIONS:  Current Outpatient Medications  Medication Sig Dispense Refill   ALPRAZolam (XANAX) 0.25 MG tablet Take 1 tablet (0.25 mg total) by mouth at bedtime as needed for anxiety. 30 tablet 0    amLODipine (NORVASC) 2.5 MG tablet TAKE 1 TABLET EVERY DAY 90 tablet 1   atorvastatin (LIPITOR) 10 MG tablet TAKE 1 TABLET BY MOUTH EVERY DAY 90 tablet 1   Calcium Carb-Cholecalciferol (CALCIUM-VITAMIN D3) 600-400 MG-UNIT TABS Take by mouth 2 (two) times daily.     ciclopirox (PENLAC) 8 % solution Apply topically at bedtime. Apply over nail and surrounding skin. Apply daily over previous coat. After seven (7) days, may remove with alcohol and continue cycle. 6.6 mL 0   cycloSPORINE (RESTASIS) 0.05 % ophthalmic emulsion Place 1 drop into both eyes 2 (two) times daily. 0.4 mL    Garlic 3893 MG CAPS Take 1,000 mg by mouth daily.      Glycerin-Polysorbate 80 (REFRESH DRY EYE THERAPY OP) Place 1-2 drops into both eyes 4 (four) times daily as needed (for dry eyes).      ipratropium (ATROVENT) 0.06 % nasal spray PLACE 2 SPRAYS IN EACH NOSTRIL 4 TIMES DAILY (Patient not taking: Reported on 03/01/2022) 15 mL 1   KRILL OIL OMEGA-3 PO Take 1 capsule by mouth daily.     letrozole (FEMARA) 2.5 MG tablet Take 1 tablet (2.5 mg total) by mouth daily. 90 tablet 3   Magnesium 250 MG TABS Take 250 mg by mouth daily.      meloxicam (MOBIC) 15 MG tablet Take 1 tablet (15 mg total) by mouth daily as needed for pain. 30 tablet 0   mometasone (ELOCON) 0.1 % cream APPLY TO AFFECTED AREA DAILY 45 g 1   olopatadine (PATADAY) 0.1 % ophthalmic solution Place 1 drop into both eyes 2 (two)  times daily. 5 mL 12   omeprazole (PRILOSEC) 20 MG capsule TAKE 1 CAPSULE EVERY DAY 90 capsule 1   potassium gluconate 595 MG TABS Take 595 mg by mouth daily.     vitamin B-12 (CYANOCOBALAMIN) 1000 MCG tablet Take 1,000 mcg by mouth daily.     No current facility-administered medications for this visit.    PHYSICAL EXAMINATION: ECOG PERFORMANCE STATUS: {CHL ONC ECOG PS:[id number]}  There were no vitals filed for this visit. There were no vitals filed for this visit.  BREAST:*** No palpable masses or nodules in either right or left  breasts. No palpable axillary supraclavicular or infraclavicular adenopathy no breast tenderness or nipple discharge. (exam performed in the presence of a chaperone)  LABORATORY DATA:  I have reviewed the data as listed    Latest Ref Rng & Units 03/01/2022    3:27 PM 10/27/2021    3:47 PM 06/29/2021    4:39 PM  CMP  Glucose 70 - 99 mg/dL 92  98  99   BUN 8 - 27 mg/dL _0 Creatinine 0.57 - 1.00 mg/dL 0.89  0.77  0.79   Sodium 134 - 144 mmol/L 142  142  143   Potassium 3.5 - 5.2 mmol/L 4.3  4.5  4.1   Chloride 96 - 106 mmol/L 104  102  105   CO2 20 - 29 mmol/L _1 Calcium 8.7 - 10.3 mg/dL 9.4  9.9  9.3     Lab Results  Component Value Date   WBC 2.8 (L) 03/02/2021   HGB 11.6 03/02/2021   HCT 35.2 03/02/2021   MCV 87 03/02/2021   PLT 238 03/02/2021   NEUTROABS 1.5 03/21/2018    ASSESSMENT & PLAN:  No problem-specific Assessment & Plan notes found for this encounter.    No orders of the defined types were placed in this encounter.  The patient has a good understanding of the overall plan. she agrees with it. she will call with any problems that may develop before the next visit here. Total time spent: 30 mins including face to face time and time spent for planning, charting and co-ordination of care   Suzzette Righter, [place] 07/02/22    I Gardiner Coins am scribing for Dr. Lindi Adie  ***

## 2022-07-04 ENCOUNTER — Encounter: Payer: Self-pay | Admitting: Nurse Practitioner

## 2022-07-04 ENCOUNTER — Other Ambulatory Visit: Payer: Self-pay

## 2022-07-04 ENCOUNTER — Inpatient Hospital Stay: Payer: Medicare PPO | Attending: Hematology and Oncology | Admitting: Hematology and Oncology

## 2022-07-04 ENCOUNTER — Ambulatory Visit: Payer: Medicare PPO | Admitting: Nurse Practitioner

## 2022-07-04 VITALS — BP 108/62 | HR 83 | Temp 98.4°F | Ht 63.0 in | Wt 159.0 lb

## 2022-07-04 VITALS — BP 135/61 | HR 73 | Temp 97.3°F | Resp 18 | Ht 63.0 in | Wt 159.3 lb

## 2022-07-04 DIAGNOSIS — Z923 Personal history of irradiation: Secondary | ICD-10-CM | POA: Insufficient documentation

## 2022-07-04 DIAGNOSIS — I10 Essential (primary) hypertension: Secondary | ICD-10-CM

## 2022-07-04 DIAGNOSIS — Z170 Estrogen receptor positive status [ER+]: Secondary | ICD-10-CM | POA: Diagnosis not present

## 2022-07-04 DIAGNOSIS — R7309 Other abnormal glucose: Secondary | ICD-10-CM | POA: Diagnosis not present

## 2022-07-04 DIAGNOSIS — Z79811 Long term (current) use of aromatase inhibitors: Secondary | ICD-10-CM | POA: Diagnosis not present

## 2022-07-04 DIAGNOSIS — E782 Mixed hyperlipidemia: Secondary | ICD-10-CM | POA: Diagnosis not present

## 2022-07-04 DIAGNOSIS — Z2821 Immunization not carried out because of patient refusal: Secondary | ICD-10-CM

## 2022-07-04 DIAGNOSIS — Z79899 Other long term (current) drug therapy: Secondary | ICD-10-CM | POA: Insufficient documentation

## 2022-07-04 DIAGNOSIS — C50212 Malignant neoplasm of upper-inner quadrant of left female breast: Secondary | ICD-10-CM | POA: Diagnosis not present

## 2022-07-04 MED ORDER — LETROZOLE 2.5 MG PO TABS: 2.5000 mg | ORAL_TABLET | ORAL | 3 refills | Status: DC

## 2022-07-04 NOTE — Assessment & Plan Note (Addendum)
04/06/2018:Left lumpectomy: IDC with extravasated mucin, grade 2, 2.8 cm, intermediate grade DCIS, margins negative, ER 90%, PR 5%, HER-2 negative ratio 1.19, Ki-67 5%, T2N0 stage IA AJCC 8 Adjuvant radiation therapy 05/16/2018 to 06/11/2018   Treatment plan: Letrozole 2.5 mg daily started 07/16/2018, switching to 3 times a week starting 07/04/2022.   Letrozole toxicities:  Severe bilateral leg cramping Generalized pruritus  We discussed options including switching to anastrozole but she does not want to do that.  She wants to rather take the letrozole 3 times a week.  Recommended discontinuation of letrozole therapy and switching to anastrozole once symptoms are better   Osteoporosis: T score -2.9: on Fosamax.  She takes calcium and vitamin D.   Right breast discomfort:  03/12/2020: Mammogram and ultrasound: Indeterminate calcifications 1.5 cm right breast: Biopsy: Fibroadenomatoid change.   Breast cancer surveillance: 1.  Mammogram 01/27/21: Benign, density cat C 2.  Breast exam 02/15/22: Benign  Return to clinic in 1 year for follow-up and graduate her after completion of 5 years of therapy.

## 2022-07-04 NOTE — Patient Instructions (Signed)
Hypertension, Adult High blood pressure (hypertension) is when the force of blood pumping through the arteries is too strong. The arteries are the blood vessels that carry blood from the heart throughout the body. Hypertension forces the heart to work harder to pump blood and may cause arteries to become narrow or stiff. Untreated or uncontrolled hypertension can lead to a heart attack, heart failure, a stroke, kidney disease, and other problems. A blood pressure reading consists of a higher number over a lower number. Ideally, your blood pressure should be below 120/80. The first ("top") number is called the systolic pressure. It is a measure of the pressure in your arteries as your heart beats. The second ("bottom") number is called the diastolic pressure. It is a measure of the pressure in your arteries as the heart relaxes. What are the causes? The exact cause of this condition is not known. There are some conditions that result in high blood pressure. What increases the risk? Certain factors may make you more likely to develop high blood pressure. Some of these risk factors are under your control, including: Smoking. Not getting enough exercise or physical activity. Being overweight. Having too much fat, sugar, calories, or salt (sodium) in your diet. Drinking too much alcohol. Other risk factors include: Having a personal history of heart disease, diabetes, high cholesterol, or kidney disease. Stress. Having a family history of high blood pressure and high cholesterol. Having obstructive sleep apnea. Age. The risk increases with age. What are the signs or symptoms? High blood pressure may not cause symptoms. Very high blood pressure (hypertensive crisis) may cause: Headache. Fast or irregular heartbeats (palpitations). Shortness of breath. Nosebleed. Nausea and vomiting. Vision changes. Severe chest pain, dizziness, and seizures. How is this diagnosed? This condition is diagnosed by  measuring your blood pressure while you are seated, with your arm resting on a flat surface, your legs uncrossed, and your feet flat on the floor. The cuff of the blood pressure monitor will be placed directly against the skin of your upper arm at the level of your heart. Blood pressure should be measured at least twice using the same arm. Certain conditions can cause a difference in blood pressure between your right and left arms. If you have a high blood pressure reading during one visit or you have normal blood pressure with other risk factors, you may be asked to: Return on a different day to have your blood pressure checked again. Monitor your blood pressure at home for 1 week or longer. If you are diagnosed with hypertension, you may have other blood or imaging tests to help your health care provider understand your overall risk for other conditions. How is this treated? This condition is treated by making healthy lifestyle changes, such as eating healthy foods, exercising more, and reducing your alcohol intake. You may be referred for counseling on a healthy diet and physical activity. Your health care provider may prescribe medicine if lifestyle changes are not enough to get your blood pressure under control and if: Your systolic blood pressure is above 130. Your diastolic blood pressure is above 80. Your personal target blood pressure may vary depending on your medical conditions, your age, and other factors. Follow these instructions at home: Eating and drinking  Eat a diet that is high in fiber and potassium, and low in sodium, added sugar, and fat. An example of this eating plan is called the DASH diet. DASH stands for Dietary Approaches to Stop Hypertension. To eat this way: Eat   plenty of fresh fruits and vegetables. Try to fill one half of your plate at each meal with fruits and vegetables. Eat whole grains, such as whole-wheat pasta, brown rice, or whole-grain bread. Fill about one  fourth of your plate with whole grains. Eat or drink low-fat dairy products, such as skim milk or low-fat yogurt. Avoid fatty cuts of meat, processed or cured meats, and poultry with skin. Fill about one fourth of your plate with lean proteins, such as fish, chicken without skin, beans, eggs, or tofu. Avoid pre-made and processed foods. These tend to be higher in sodium, added sugar, and fat. Reduce your daily sodium intake. Many people with hypertension should eat less than 1,500 mg of sodium a day. Do not drink alcohol if: Your health care provider tells you not to drink. You are pregnant, may be pregnant, or are planning to become pregnant. If you drink alcohol: Limit how much you have to: 0-1 drink a day for women. 0-2 drinks a day for men. Know how much alcohol is in your drink. In the [place] one drink equals one 12 oz bottle of beer (355 mL), one 5 oz glass of wine (148 mL), or one 1 oz glass of hard liquor (44 mL). Lifestyle  Work with your health care provider to maintain a healthy body weight or to lose weight. Ask what an ideal weight is for you. Get at least 30 minutes of exercise that causes your heart to beat faster (aerobic exercise) most days of the week. Activities may include walking, swimming, or biking. Include exercise to strengthen your muscles (resistance exercise), such as Pilates or lifting weights, as part of your weekly exercise routine. Try to do these types of exercises for 30 minutes at least 3 days a week. Do not use any products that contain nicotine or tobacco. These products include cigarettes, chewing tobacco, and vaping devices, such as e-cigarettes. If you need help quitting, ask your health care provider. Monitor your blood pressure at home as told by your health care provider. Keep all follow-up visits. This is important. Medicines Take over-the-counter and prescription medicines only as told by your health care provider. Follow directions carefully. Blood  pressure medicines must be taken as prescribed. Do not skip doses of blood pressure medicine. Doing this puts you at risk for problems and can make the medicine less effective. Ask your health care provider about side effects or reactions to medicines that you should watch for. Contact a health care provider if you: Think you are having a reaction to a medicine you are taking. Have headaches that keep coming back (recurring). Feel dizzy. Have swelling in your ankles. Have trouble with your vision. Get help right away if you: Develop a severe headache or confusion. Have unusual weakness or numbness. Feel faint. Have severe pain in your chest or abdomen. Vomit repeatedly. Have trouble breathing. These symptoms may be an emergency. Get help right away. Call 911. Do not wait to see if the symptoms will go away. Do not drive yourself to the hospital. Summary Hypertension is when the force of blood pumping through your arteries is too strong. If this condition is not controlled, it may put you at risk for serious complications. Your personal target blood pressure may vary depending on your medical conditions, your age, and other factors. For most people, a normal blood pressure is less than 120/80. Hypertension is treated with lifestyle changes, medicines, or a combination of both. Lifestyle changes include losing weight, eating a healthy,   low-sodium diet, exercising more, and limiting alcohol. This information is not intended to replace advice given to you by your health care provider. Make sure you discuss any questions you have with your health care provider. Document Revised: 06/15/2021 Document Reviewed: 06/15/2021 Elsevier Patient Education  2023 [employer]  

## 2022-07-04 NOTE — Progress Notes (Signed)
I,Dawn Rodriguez,acting as a Education administrator for [employer], FNP.,have documented all relevant documentation on the behalf of Dawn Brine, FNP,as directed by  Dawn Brine, FNP while in the presence of Dawn Rodriguez, [place].  Subjective:     Patient ID: Dawn Rodriguez , female    DOB: 06-22-1936 , 86 y.o.   MRN: [id number]   Chief Complaint  Patient presents with   Hypertension    HPI  Patient presents today for a f/u on her bp, hyperlipidemia and abnormal glucose. Her AWV is scheduled in December.   Hypertension This is a chronic problem. The current episode started more than 1 year ago. The problem is unchanged. Pertinent negatives include no chest pain, headaches or palpitations. Risk factors for coronary artery disease include dyslipidemia. Past treatments include calcium channel blockers. There are no compliance problems.  There is no history of angina.  Hyperlipidemia Pertinent negatives include no chest pain.     Past Medical History:  Diagnosis Date   Arthritis    Bilateral dry eyes    Cancer ([place]) 02/2018   left breast cancer   Cataracts, bilateral    Chronic headache    GERD (gastroesophageal reflux disease)    Hypertension    Personal history of radiation therapy    TMJ syndrome    Vaginal discharge 11/12/2018     Family History  Problem Relation Age of Onset   Pneumonia Mother      Current Outpatient Medications:    ALPRAZolam (XANAX) 0.25 MG tablet, Take 1 tablet (0.25 mg total) by mouth at bedtime as needed for anxiety., Disp: 30 tablet, Rfl: 0   amLODipine (NORVASC) 2.5 MG tablet, TAKE 1 TABLET EVERY DAY, Disp: 90 tablet, Rfl: 1   atorvastatin (LIPITOR) 10 MG tablet, TAKE 1 TABLET BY MOUTH EVERY DAY, Disp: 90 tablet, Rfl: 1   Calcium Carb-Cholecalciferol (CALCIUM-VITAMIN D3) 600-400 MG-UNIT TABS, Take by mouth 2 (two) times daily., Disp: , Rfl:    ciclopirox (PENLAC) 8 % solution, Apply topically at bedtime. Apply over nail and surrounding skin. Apply daily  over previous coat. After seven (7) days, may remove with alcohol and continue cycle., Disp: 6.6 mL, Rfl: 0   cycloSPORINE (RESTASIS) 0.05 % ophthalmic emulsion, Place 1 drop into both eyes 2 (two) times daily., Disp: 0.4 mL, Rfl:    Garlic 2694 MG CAPS, Take 1,000 mg by mouth daily. , Disp: , Rfl:    Glycerin-Polysorbate 80 (REFRESH DRY EYE THERAPY OP), Place 1-2 drops into both eyes 4 (four) times daily as needed (for dry eyes). , Disp: , Rfl:    ipratropium (ATROVENT) 0.06 % nasal spray, PLACE 2 SPRAYS IN EACH NOSTRIL 4 TIMES DAILY, Disp: 15 mL, Rfl: 1   KRILL OIL OMEGA-3 PO, Take 1 capsule by mouth daily., Disp: , Rfl:    letrozole (FEMARA) 2.5 MG tablet, Take 1 tablet (2.5 mg total) by mouth 3 (three) times a week., Disp: 90 tablet, Rfl: 3   Magnesium 250 MG TABS, Take 250 mg by mouth daily. , Disp: , Rfl:    meloxicam (MOBIC) 15 MG tablet, Take 1 tablet (15 mg total) by mouth daily as needed for pain., Disp: 30 tablet, Rfl: 0   mometasone (ELOCON) 0.1 % cream, APPLY TO AFFECTED AREA DAILY, Disp: 45 g, Rfl: 1   olopatadine (PATADAY) 0.1 % ophthalmic solution, Place 1 drop into both eyes 2 (two) times daily., Disp: 5 mL, Rfl: 12   omeprazole (PRILOSEC) 20 MG capsule, TAKE 1 CAPSULE EVERY DAY, Disp:  90 capsule, Rfl: 1   potassium gluconate 595 MG TABS, Take 595 mg by mouth daily., Disp: , Rfl:    vitamin B-12 (CYANOCOBALAMIN) 1000 MCG tablet, Take 1,000 mcg by mouth daily., Disp: , Rfl:    Allergies  Allergen Reactions   Tobramycin Itching, Swelling and Rash    Tobramycin eye drops caused swelling, itching, drainage, rash and peeling of the skin     Review of Systems  Constitutional: Negative.   HENT: Negative.    Respiratory: Negative.    Cardiovascular: Negative.  Negative for chest pain and palpitations.  Gastrointestinal: Negative.   Neurological: Negative.  Negative for headaches.  Psychiatric/Behavioral: Negative.       Today's Vitals   07/04/22 1551  BP: 108/62  Pulse: 83   Temp: 98.4 F (36.9 C)  TempSrc: Oral  Weight: 159 lb (72.1 kg)  Height: _0  (1.6 m)   Body mass index is 28.17 kg/m.  Wt Readings from Last 3 Encounters:  07/04/22 159 lb (72.1 kg)  07/04/22 159 lb 4.8 oz (72.3 kg)  03/01/22 159 lb (72.1 kg)    Objective:  Physical Exam Vitals reviewed.  Constitutional:      General: She is not in acute distress.    Appearance: Normal appearance.  Cardiovascular:     Rate and Rhythm: Normal rate and regular rhythm.     Pulses: Normal pulses.     Heart sounds: Normal heart sounds. No murmur heard. Pulmonary:     Effort: Pulmonary effort is normal. No respiratory distress.     Breath sounds: Normal breath sounds. No wheezing.  Musculoskeletal:        General: Normal range of motion.  Skin:    General: Skin is warm and dry.     Capillary Refill: Capillary refill takes less than 2 seconds.  Neurological:     General: No focal deficit present.     Mental Status: She is alert and oriented to person, place, and time.     Cranial Nerves: No cranial nerve deficit.     Motor: No weakness.  Psychiatric:        Mood and Affect: Mood normal.        Behavior: Behavior normal.        Thought Content: Thought content normal.        Judgment: Judgment normal.     Assessment And Plan:     1. Essential hypertension Comments: Blood pressure is well controlled, continue current medications - BMP8+EGFR  2. Mixed hyperlipidemia Comments: Continue statin, tolerating well. - Lipid panel  3. Abnormal glucose Comments: Diet controlled. Continue focusing on healthy diet low in sugar and starches. - Hemoglobin A1c  4. Influenza vaccination declined Patient declined influenza vaccination at this time. Patient is aware that influenza vaccine prevents illness in 70% of healthy people, and reduces hospitalizations to 30-70% in elderly. This vaccine is recommended annually. Education has been provided regarding the importance of this vaccine but patient  still declined. Advised may receive this vaccine at local pharmacy or Health Dept.or vaccine clinic. Aware to provide a copy of the vaccination record if obtained from local pharmacy or Health Dept.  Pt is willing to accept risk associated with refusing vaccination.  5. Herpes zoster vaccination declined  Declines shingrix, educated on disease process and is aware if he changes his mind to notify office   Patient was given opportunity to ask questions. Patient verbalized understanding of the plan and was able to repeat key elements of the  plan. All questions were answered to their satisfaction.  Dawn Brine, FNP   I, Dawn Brine, FNP, have reviewed all documentation for this visit. The documentation on 07/04/22 for the exam, diagnosis, procedures, and orders are all accurate and complete.   IF YOU HAVE BEEN REFERRED TO A SPECIALIST, IT MAY TAKE 1-2 WEEKS TO SCHEDULE/PROCESS THE REFERRAL. IF YOU HAVE NOT HEARD FROM US/SPECIALIST IN TWO WEEKS, PLEASE GIVE [place] A CALL AT [phone].   THE PATIENT IS ENCOURAGED TO PRACTICE SOCIAL DISTANCING DUE TO THE COVID-19 PANDEMIC.

## 2022-07-05 LAB — BMP8+EGFR
BUN/Creatinine Ratio: 9 — ABNORMAL LOW (ref 12–28)
BUN: 7 mg/dL — ABNORMAL LOW (ref 8–27)
CO2: 25 mmol/L (ref 20–29)
Calcium: 9.3 mg/dL (ref 8.7–10.3)
Chloride: 104 mmol/L (ref 96–106)
Creatinine, Ser: 0.77 mg/dL (ref 0.57–1.00)
Glucose: 103 mg/dL — ABNORMAL HIGH (ref 70–99)
Potassium: 3.9 mmol/L (ref 3.5–5.2)
Sodium: 143 mmol/L (ref 134–144)
eGFR: 76 mL/min/{1.73_m2} (ref >59)

## 2022-07-05 LAB — LIPID PANEL
Chol/HDL Ratio: 3.2 ratio (ref 0.0–4.4)
Cholesterol, Total: 190 mg/dL (ref 100–199)
HDL: 59 mg/dL (ref >39)
LDL Chol Calc (NIH): 117 mg/dL — ABNORMAL HIGH (ref 0–99)
Triglycerides: 79 mg/dL (ref 0–149)
VLDL Cholesterol Cal: 14 mg/dL (ref 5–40)

## 2022-07-05 LAB — HEMOGLOBIN A1C
Est. average glucose Bld gHb Est-mCnc: 114 mg/dL
Hgb A1c MFr Bld: 5.6 % (ref 4.8–5.6)

## 2022-07-27 ENCOUNTER — Ambulatory Visit (INDEPENDENT_AMBULATORY_CARE_PROVIDER_SITE_OTHER): Payer: Medicare PPO

## 2022-07-27 ENCOUNTER — Ambulatory Visit: Payer: Medicare PPO | Admitting: Nurse Practitioner

## 2022-07-27 VITALS — Ht 64.0 in | Wt 151.0 lb

## 2022-07-27 DIAGNOSIS — Z0000 Encounter for general adult medical examination without abnormal findings: Secondary | ICD-10-CM

## 2022-07-27 NOTE — Progress Notes (Signed)
I connected with Dawn Rodriguez today by telephone and verified that I am speaking with the correct person using two identifiers. Location patient: home Location provider: work Persons participating in the virtual visit: Dalyah, Pla LPN.   I discussed the limitations, risks, security and privacy concerns of performing an evaluation and management service by telephone and the availability of in person appointments. I also discussed with the patient that there may be a patient responsible charge related to this service. The patient expressed understanding and verbally consented to this telephonic visit.    Interactive audio and video telecommunications were attempted between this provider and patient, however failed, due to patient having technical difficulties OR patient did not have access to video capability.  We continued and completed visit with audio only.     Vital signs may be patient reported or missing.  Subjective:   Dawn Rodriguez is a 86 y.o. female who presents for Medicare Annual (Subsequent) preventive examination.  Review of Systems     Cardiac Risk Factors include: advanced age (>74mn, >>75women)     Objective:    Today's Vitals   07/27/22 1434  Weight: 151 lb (68.5 kg)  Height: '5\' 4"'$  (1.626 m)   Body mass index is 25.92 kg/m.     07/27/2022    2:40 PM 06/24/2021    3:15 PM 07/15/2020   11:55 AM 06/19/2019    3:47 PM 07/16/2018    4:07 PM 05/23/2018    2:46 PM 04/19/2018    3:14 PM  Advanced Directives  Does Patient Have a Medical Advance Directive? No No No No No No No  Would patient like information on creating a medical advance directive?  No - Patient declined    Yes (MAU/Ambulatory/Procedural Areas - Information given)     Current Medications (verified) Outpatient Encounter Medications as of 07/27/2022  Medication Sig   ALPRAZolam (XANAX) 0.25 MG tablet Take 1 tablet (0.25 mg total) by mouth at bedtime as needed for anxiety.    amLODipine (NORVASC) 2.5 MG tablet TAKE 1 TABLET EVERY DAY   atorvastatin (LIPITOR) 10 MG tablet TAKE 1 TABLET BY MOUTH EVERY DAY   Calcium Carb-Cholecalciferol (CALCIUM-VITAMIN D3) 600-400 MG-UNIT TABS Take by mouth 2 (two) times daily.   ciclopirox (PENLAC) 8 % solution Apply topically at bedtime. Apply over nail and surrounding skin. Apply daily over previous coat. After seven (7) days, may remove with alcohol and continue cycle.   cycloSPORINE (RESTASIS) 0.05 % ophthalmic emulsion Place 1 drop into both eyes 2 (two) times daily.   Garlic 10867MG CAPS Take 1,000 mg by mouth daily.    Glycerin-Polysorbate 80 (REFRESH DRY EYE THERAPY OP) Place 1-2 drops into both eyes 4 (four) times daily as needed (for dry eyes).    ipratropium (ATROVENT) 0.06 % nasal spray PLACE 2 SPRAYS IN EACH NOSTRIL 4 TIMES DAILY   KRILL OIL OMEGA-3 PO Take 1 capsule by mouth daily.   letrozole (FEMARA) 2.5 MG tablet Take 1 tablet (2.5 mg total) by mouth 3 (three) times a week.   Magnesium 250 MG TABS Take 250 mg by mouth daily.    meloxicam (MOBIC) 15 MG tablet Take 1 tablet (15 mg total) by mouth daily as needed for pain.   mometasone (ELOCON) 0.1 % cream APPLY TO AFFECTED AREA DAILY   olopatadine (PATADAY) 0.1 % ophthalmic solution Place 1 drop into both eyes 2 (two) times daily.   omeprazole (PRILOSEC) 20 MG capsule TAKE 1 CAPSULE EVERY DAY  potassium gluconate 595 MG TABS Take 595 mg by mouth daily.   vitamin B-12 (CYANOCOBALAMIN) 1000 MCG tablet Take 1,000 mcg by mouth daily.   No facility-administered encounter medications on file as of 07/27/2022.    Allergies (verified) Tobramycin   History: Past Medical History:  Diagnosis Date   Arthritis    Bilateral dry eyes    Cancer ([place]) 02/2018   left breast cancer   Cataracts, bilateral    Chronic headache    GERD (gastroesophageal reflux disease)    Hypertension    Personal history of radiation therapy    TMJ syndrome    Vaginal discharge 11/12/2018    Past Surgical History:  Procedure Laterality Date   BREAST BIOPSY Right 2012   stereotatic biopsy   BREAST BIOPSY Left 03/14/2018   BREAST BIOPSY Right 03/19/2020   BREAST LUMPECTOMY Left 04/06/2018   Procedure: LEFT BREAST LUMPECTOMY;  Surgeon: Excell Seltzer, MD;  Location: [facility];  Service: General;  Laterality: Left;   CATARACT EXTRACTION     Family History  Problem Relation Age of Onset   Pneumonia Mother    Social History   Socioeconomic History   Marital status: Divorced    Spouse name: Not on file   Number of children: 2   Years of education: Not on file   Highest education level: Not on file  Occupational History   Occupation: retired  Tobacco Use   Smoking status: Never   Smokeless tobacco: Never  Vaping Use   Vaping Use: Never used  Substance and Sexual Activity   Alcohol use: No   Drug use: No   Sexual activity: Not Currently    Birth control/protection: None  Other Topics Concern   Not on file  Social History Narrative   Not on file   Social Determinants of Health   Financial Resource Strain: [place]  (07/27/2022)   Overall Financial Resource Strain (CARDIA)    Difficulty of Paying Living Expenses: Not hard at all  Food Insecurity: No [place] (07/27/2022)   Hunger Vital Sign    Worried About Running Out of Food in the Last Year: Never true    [place] in the Last Year: Never true  Transportation Needs: No Transportation Needs (07/27/2022)   PRAPARE - Hydrologist (Medical): No    Lack of Transportation (Non-Medical): No  Physical Activity: Insufficiently Active (07/27/2022)   Exercise Vital Sign    Days of Exercise per Week: 3 days    Minutes of Exercise per Session: 30 min  Stress: No Stress Concern Present (07/27/2022)   [place]    Feeling of Stress : Not at all  Social Connections: Not on file    Tobacco  Counseling Counseling given: Not Answered   Clinical Intake:  Pre-visit preparation completed: Yes  Pain : No/denies pain     Nutritional Status: BMI 25 -29 Overweight Nutritional Risks: None Diabetes: No  How often do you need to have someone help you when you read instructions, pamphlets, or other written materials from your doctor or pharmacy?: 1 - Never  Diabetic? no  Interpreter Needed?: No  Information entered by :: NAllen LPN   Activities of Daily Living    07/27/2022    2:42 PM  In your present state of health, do you have any difficulty performing the following activities:  Hearing? 0  Vision? 0  Difficulty concentrating or making decisions?  0  Walking or climbing stairs? 0  Dressing or bathing? 0  Doing errands, shopping? 0  Preparing Food and eating ? N  Using the Toilet? N  In the past six months, have you accidently leaked urine? Y  Comment with sneezing sometimes  Do you have problems with loss of bowel control? N  Managing your Medications? N  Managing your Finances? N  Housekeeping or managing your Housekeeping? N    Patient Care Team: [name], FNP as PCP - General (General Practice) Excell Seltzer, MD (Inactive) as Consulting Physician (General Surgery) Nicholas Lose, MD as Consulting Physician (Hematology and Oncology) Gery Pray, MD as Consulting Physician (Radiation Oncology)  Indicate any recent Medical Services you may have received from other than [facility] providers in the past year (date may be approximate).     Assessment:   This is a routine wellness examination for Dawn Rodriguez.  Hearing/Vision screen Vision Screening - Comments:: Regular eye exams, [facility]  Dietary issues and exercise activities discussed: Current Exercise Habits: Home exercise routine, Type of exercise: walking, Time (Minutes): 30, Frequency (Times/Week): 3, Weekly Exercise (Minutes/Week): 90   Goals Addressed             This Visit's Progress     Patient Stated       07/27/2022, wants to get back in to a size 10       Depression Screen    07/27/2022    2:42 PM 07/04/2022    3:45 PM 06/24/2021    3:16 PM 07/15/2020   11:58 AM 12/23/2019    4:14 PM 09/12/2019    2:34 PM 06/19/2019    3:48 PM  PHQ 2/9 Scores  PHQ - 2 Score 0 0 0 0 0 0 0  PHQ- 9 Score       0    Fall Risk    07/27/2022    2:41 PM 07/04/2022    3:45 PM 06/24/2021    3:16 PM 07/15/2020   11:57 AM 12/23/2019    4:14 PM  Fall Risk   Falls in the past year? 0 0 0 1 0  Comment    tripped on brick   Number falls in past yr: 0 0  0 0  Injury with Fall? 0 0  0 0  Risk for fall due to : No Fall Risks No Fall Risks Medication side effect Medication side effect   Follow up Falls prevention discussed Falls evaluation completed Falls evaluation completed;Education provided;Falls prevention discussed Falls evaluation completed;Falls prevention discussed;Education provided     FALL RISK PREVENTION PERTAINING TO THE HOME:  Any stairs in or around the home? Yes  If so, are there any without handrails? No  Home free of loose throw rugs in walkways, pet beds, electrical cords, etc? Yes  Adequate lighting in your home to reduce risk of falls? Yes   ASSISTIVE DEVICES UTILIZED TO PREVENT FALLS:  Life alert? No  Use of a cane, walker or w/c? No  Grab bars in the bathroom? No  Shower chair or bench in shower? No  Elevated toilet seat or a handicapped toilet? No   TIMED UP AND GO:  Was the test performed? No .      Cognitive Function:        07/27/2022    2:45 PM 06/24/2021    3:17 PM 07/15/2020   12:01 PM 06/19/2019    3:53 PM 05/23/2018    2:50 PM  6CIT Screen  What Year? 0 points  0 points 0 points 0 points 0 points  What month? 0 points 0 points 0 points 0 points 0 points  What time? 0 points 0 points 0 points 0 points 0 points  Count back from 20 0 points 0 points 0 points 0 points 0 points  Months in reverse 0 points 0 points 0 points 2 points 2 points   Repeat phrase 2 points 6 points 2 points 6 points 0 points  Total Score 2 points 6 points 2 points 8 points 2 points    Immunizations Immunization History  Administered Date(s) Administered   Moderna Covid-19 Vaccine Bivalent Booster 52yr & up 07/13/2021   Moderna Sars-Covid-2 Vaccination 11/08/2019, 12/09/2019   PFIZER(Purple Top)SARS-COV-2 Vaccination 07/15/2020   Pneumococcal Conjugate-13 06/19/2019, 06/23/2019   Pneumococcal Polysaccharide-23 01/12/2021   Tdap 05/17/2013   Unspecified SARS-COV-2 Vaccination 07/23/2022    TDAP status: Up to date  Flu Vaccine status: Declined, Education has been provided regarding the importance of this vaccine but patient still declined. Advised may receive this vaccine at local pharmacy or Health Dept. Aware to provide a copy of the vaccination record if obtained from local pharmacy or Health Dept. Verbalized acceptance and understanding.  Pneumococcal vaccine status: Up to date  Covid-19 vaccine status: Completed vaccines  Qualifies for Shingles Vaccine? Yes   Zostavax completed No   Shingrix Completed?: No.    Education has been provided regarding the importance of this vaccine. Patient has been advised to call insurance company to determine out of pocket expense if they have not yet received this vaccine. Advised may also receive vaccine at local pharmacy or Health Dept. Verbalized acceptance and understanding.  Screening Tests Health Maintenance  Topic Date Due   Medicare Annual Wellness (AWV)  06/24/2022   Zoster Vaccines- Shingrix (1 of 2) 10/04/2022 (Originally 09/02/1954)   INFLUENZA VACCINE  11/20/2022 (Originally 03/22/2022)   COVID-19 Vaccine (6 - 2023-24 season) 09/17/2022   DTaP/Tdap/Td (2 - Td or Tdap) 05/18/2023   Pneumonia Vaccine 86 Years old  Completed   DEXA SCAN  Completed   HPV VACCINES  Aged Out    Health Maintenance  Health Maintenance Due  Topic Date Due   Medicare Annual Wellness (AWV)  06/24/2022     Colorectal cancer screening: No longer required.   Mammogram status: Completed 04/29/2022. Repeat every year  Bone Density status: Completed 07/21/2017.   Lung Cancer Screening: (Low Dose CT Chest recommended if Age 84-80years, 30 pack-year currently smoking OR have quit w/in 15years.) does not qualify.   Lung Cancer Screening Referral: no  Additional Screening:  Hepatitis C Screening: does not qualify;   Vision Screening: Recommended annual ophthalmology exams for early detection of glaucoma and other disorders of the eye. Is the patient up to date with their annual eye exam?  Yes  Who is the provider or what is the name of the [facility] which the patient [facility] If pt is not established with a provider, would they like to be referred to a provider to establish care? No .   Dental Screening: Recommended annual dental exams for proper oral hygiene  Community Resource Referral / Chronic Care Management: CRR required this visit?  No   CCM required this visit?  No      Plan:     I have personally reviewed and noted the following in the patient's chart:   Medical and social history Use of alcohol, tobacco or illicit drugs  Current medications and supplements  including opioid prescriptions. Patient is not currently taking opioid prescriptions. Functional ability and status Nutritional status Physical activity Advanced directives List of other physicians Hospitalizations, surgeries, and ER visits in previous 12 months Vitals Screenings to include cognitive, depression, and falls Referrals and appointments  In addition, I have reviewed and discussed with patient certain preventive protocols, quality metrics, and best practice recommendations. A written personalized care plan for preventive services as well as general preventive health recommendations were provided to patient.     Kellie Simmering, LPN   [id number]   Nurse Notes:  none  Due to this being a virtual visit, the after visit summary with patients personalized plan was offered to patient via mail or my-chart.  to pick up at office at next visit

## 2022-07-27 NOTE — Patient Instructions (Addendum)
Dawn Rodriguez , Thank you for taking time to come for your Medicare Wellness Visit. I appreciate your ongoing commitment to your health goals. Please review the following plan we discussed and let me know if I can assist you in the future.   These are the goals we discussed:  Goals       DIET - REDUCE SUGAR INTAKE (pt-stated)      Patient would like to cut back on snacks.      Exercise 150 min/wk Moderate Activity      06/19/2019, wants to start an exercise room      Patient Stated      07/15/2020, wants to get back to a size 10, eat healthy, cut back on pasta      Patient Stated      06/24/2021, wants to eat properly      Patient Stated      07/27/2022, wants to get back in to a size 10        This is a list of the screening recommended for you and due dates:  Health Maintenance  Topic Date Due   Zoster (Shingles) Vaccine (1 of 2) 10/04/2022*   Flu Shot  11/20/2022*   COVID-19 Vaccine (6 - 2023-24 season) 09/17/2022   DTaP/Tdap/Td vaccine (2 - Td or Tdap) 05/18/2023   Medicare Annual Wellness Visit  07/28/2023   Pneumonia Vaccine  Completed   DEXA scan (bone density measurement)  Completed   HPV Vaccine  Aged Out  *Topic was postponed. The date shown is not the original due date.    Advanced directives: Advance directive discussed with you today.   Conditions/risks identified: none  Next appointment: Follow up in one year for your annual wellness visit    Preventive Care 65 Years and Older, Female Preventive care refers to lifestyle choices and visits with your health care provider that can promote health and wellness. What does preventive care include? A yearly physical exam. This is also called an annual well check. Dental exams once or twice a year. Routine eye exams. Ask your health care provider how often you should have your eyes checked. Personal lifestyle choices, including: Daily care of your teeth and gums. Regular physical activity. Eating a healthy  diet. Avoiding tobacco and drug use. Limiting alcohol use. Practicing safe sex. Taking low-dose aspirin every day. Taking vitamin and mineral supplements as recommended by your health care provider. What happens during an annual well check? The services and screenings done by your health care provider during your annual well check will depend on your age, overall health, lifestyle risk factors, and family history of disease. Counseling  Your health care provider may ask you questions about your: Alcohol use. Tobacco use. Drug use. Emotional well-being. Home and relationship well-being. Sexual activity. Eating habits. History of falls. Memory and ability to understand (cognition). Work and work Statistician. Reproductive health. Screening  You may have the following tests or measurements: Height, weight, and BMI. Blood pressure. Lipid and cholesterol levels. These may be checked every 5 years, or more frequently if you are over 35 years old. Skin check. Lung cancer screening. You may have this screening every year starting at age 68 if you have a 30-pack-year history of smoking and currently smoke or have quit within the past 15 years. Fecal occult blood test (FOBT) of the stool. You may have this test every year starting at age 49. Flexible sigmoidoscopy or colonoscopy. You may have a sigmoidoscopy every 5 years or a  colonoscopy every 10 years starting at age 11. Hepatitis C blood test. Hepatitis B blood test. Sexually transmitted disease (STD) testing. Diabetes screening. This is done by checking your blood sugar (glucose) after you have not eaten for a while (fasting). You may have this done every 1-3 years. Bone density scan. This is done to screen for osteoporosis. You may have this done starting at age 41. Mammogram. This may be done every 1-2 years. Talk to your health care provider about how often you should have regular mammograms. Talk with your health care provider about  your test results, treatment options, and if necessary, the need for more tests. Vaccines  Your health care provider may recommend certain vaccines, such as: Influenza vaccine. This is recommended every year. Tetanus, diphtheria, and acellular pertussis (Tdap, Td) vaccine. You may need a Td booster every 10 years. Zoster vaccine. You may need this after age 65. Pneumococcal 13-valent conjugate (PCV13) vaccine. One dose is recommended after age 15. Pneumococcal polysaccharide (PPSV23) vaccine. One dose is recommended after age 67. Talk to your health care provider about which screenings and vaccines you need and how often you need them. This information is not intended to replace advice given to you by your health care provider. Make sure you discuss any questions you have with your health care provider. Document Released: 09/04/2015 Document Revised: 04/27/2016 Document Reviewed: 06/09/2015 Elsevier Interactive Patient Education  2017 [place] Prevention in the Home Falls can cause injuries. They can happen to people of all ages. There are many things you can do to make your home safe and to help prevent falls. What can I do on the outside of my home? Regularly fix the edges of walkways and driveways and fix any cracks. Remove anything that might make you trip as you walk through a door, such as a raised step or threshold. Trim any bushes or trees on the path to your home. Use bright outdoor lighting. Clear any walking paths of anything that might make someone trip, such as rocks or tools. Regularly check to see if handrails are loose or broken. Make sure that both sides of any steps have handrails. Any raised decks and porches should have guardrails on the edges. Have any leaves, snow, or ice cleared regularly. Use sand or salt on walking paths during winter. Clean up any spills in your garage right away. This includes oil or grease spills. What can I do in the bathroom? Use  night lights. Install grab bars by the toilet and in the tub and shower. Do not use towel bars as grab bars. Use non-skid mats or decals in the tub or shower. If you need to sit down in the shower, use a plastic, non-slip stool. Keep the floor dry. Clean up any water that spills on the floor as soon as it happens. Remove soap buildup in the tub or shower regularly. Attach bath mats securely with double-sided non-slip rug tape. Do not have throw rugs and other things on the floor that can make you trip. What can I do in the bedroom? Use night lights. Make sure that you have a light by your bed that is easy to reach. Do not use any sheets or blankets that are too big for your bed. They should not hang down onto the floor. Have a firm chair that has side arms. You can use this for support while you get dressed. Do not have throw rugs and other things on the floor that can  make you trip. What can I do in the kitchen? Clean up any spills right away. Avoid walking on wet floors. Keep items that you use a lot in easy-to-reach places. If you need to reach something above you, use a strong step stool that has a grab bar. Keep electrical cords out of the way. Do not use floor polish or wax that makes floors slippery. If you must use wax, use non-skid floor wax. Do not have throw rugs and other things on the floor that can make you trip. What can I do with my stairs? Do not leave any items on the stairs. Make sure that there are handrails on both sides of the stairs and use them. Fix handrails that are broken or loose. Make sure that handrails are as long as the stairways. Check any carpeting to make sure that it is firmly attached to the stairs. Fix any carpet that is loose or worn. Avoid having throw rugs at the top or bottom of the stairs. If you do have throw rugs, attach them to the floor with carpet tape. Make sure that you have a light switch at the top of the stairs and the bottom of the  stairs. If you do not have them, ask someone to add them for you. What else can I do to help prevent falls? Wear shoes that: Do not have high heels. Have rubber bottoms. Are comfortable and fit you well. Are closed at the toe. Do not wear sandals. If you use a stepladder: Make sure that it is fully opened. Do not climb a closed stepladder. Make sure that both sides of the stepladder are locked into place. Ask someone to hold it for you, if possible. Clearly mark and make sure that you can see: Any grab bars or handrails. First and last steps. Where the edge of each step is. Use tools that help you move around (mobility aids) if they are needed. These include: Canes. Walkers. Scooters. Crutches. Turn on the lights when you go into a dark area. Replace any light bulbs as soon as they burn out. Set up your furniture so you have a clear path. Avoid moving your furniture around. If any of your floors are uneven, fix them. If there are any pets around you, be aware of where they are. Review your medicines with your doctor. Some medicines can make you feel dizzy. This can increase your chance of falling. Ask your doctor what other things that you can do to help prevent falls. This information is not intended to replace advice given to you by your health care provider. Make sure you discuss any questions you have with your health care provider. Document Released: 06/04/2009 Document Revised: 01/14/2016 Document Reviewed: 09/12/2014 Elsevier Interactive Patient Education  2017 [employer].

## 2022-11-02 ENCOUNTER — Encounter: Payer: Medicare PPO | Admitting: Nurse Practitioner

## 2022-11-04 ENCOUNTER — Ambulatory Visit (INDEPENDENT_AMBULATORY_CARE_PROVIDER_SITE_OTHER): Payer: Medicare PPO | Admitting: Nurse Practitioner

## 2022-11-04 ENCOUNTER — Encounter: Payer: Self-pay | Admitting: Nurse Practitioner

## 2022-11-04 VITALS — BP 140/68 | HR 72 | Temp 98.2°F | Ht 64.0 in | Wt 162.2 lb

## 2022-11-04 DIAGNOSIS — E782 Mixed hyperlipidemia: Secondary | ICD-10-CM | POA: Diagnosis not present

## 2022-11-04 DIAGNOSIS — Z0001 Encounter for general adult medical examination with abnormal findings: Secondary | ICD-10-CM | POA: Diagnosis not present

## 2022-11-04 DIAGNOSIS — C50212 Malignant neoplasm of upper-inner quadrant of left female breast: Secondary | ICD-10-CM | POA: Diagnosis not present

## 2022-11-04 DIAGNOSIS — Z0000 Encounter for general adult medical examination without abnormal findings: Secondary | ICD-10-CM

## 2022-11-04 DIAGNOSIS — Z170 Estrogen receptor positive status [ER+]: Secondary | ICD-10-CM | POA: Diagnosis not present

## 2022-11-04 DIAGNOSIS — Z2821 Immunization not carried out because of patient refusal: Secondary | ICD-10-CM

## 2022-11-04 DIAGNOSIS — Z79899 Other long term (current) drug therapy: Secondary | ICD-10-CM | POA: Diagnosis not present

## 2022-11-04 DIAGNOSIS — I10 Essential (primary) hypertension: Secondary | ICD-10-CM | POA: Insufficient documentation

## 2022-11-04 DIAGNOSIS — R7309 Other abnormal glucose: Secondary | ICD-10-CM | POA: Insufficient documentation

## 2022-11-04 DIAGNOSIS — F419 Anxiety disorder, unspecified: Secondary | ICD-10-CM | POA: Insufficient documentation

## 2022-11-04 LAB — POCT URINALYSIS DIPSTICK
Bilirubin, UA: NEGATIVE
Blood, UA: NEGATIVE
Glucose, UA: NEGATIVE
Ketones, UA: NEGATIVE
Leukocytes, UA: NEGATIVE
Nitrite, UA: NEGATIVE
Protein, UA: NEGATIVE
Spec Grav, UA: 1.025 (ref 1.010–1.025)
Urobilinogen, UA: 2 E.U./dL — AB
pH, UA: 7 (ref 5.0–8.0)

## 2022-11-04 MED ORDER — ATORVASTATIN CALCIUM 10 MG PO TABS: 10.0000 mg | ORAL_TABLET | Freq: Every day | ORAL | 1 refills | Status: DC

## 2022-11-04 MED ORDER — ALPRAZOLAM 0.25 MG PO TABS: 0.2500 mg | ORAL_TABLET | Freq: Every evening | ORAL | 0 refills | Status: DC | PRN

## 2022-11-04 MED ORDER — AMLODIPINE BESYLATE 5 MG PO TABS: 5.0000 mg | ORAL_TABLET | Freq: Every day | ORAL | 1 refills | Status: DC

## 2022-11-04 MED ORDER — MAGNESIUM 250 MG PO TABS: 250.0000 mg | ORAL_TABLET | Freq: Every day | ORAL | 1 refills | Status: AC

## 2022-11-04 NOTE — Progress Notes (Signed)
Barnet Glasgow Martin,acting as a Education administrator for [name], FNP.,have documented all relevant documentation on the behalf of [name], FNP,as directed by  [name], FNP while in the presence of [name], [place].   Subjective:     Patient ID: Dawn Rodriguez , female    DOB: 1936/07/10 , 87 y.o.   MRN: [id number]   Chief Complaint  Patient presents with   Annual Exam    HPI  Patient presents today for HM. Patient that's compliance with medications and has no other concerns today. She is taking her medication from the oncologist MWF. This is her last year to take the chemo medication. Her leg cramps are not as bad since decreasing the dose.   BP Readings from Last 3 Encounters: 11/04/22 : (!) 140/68 07/04/22 : 108/62 07/04/22 : 135/61    Hypertension This is a chronic problem. The current episode started more than 1 year ago. The problem is unchanged. Pertinent negatives include no chest pain, headaches or palpitations. Risk factors for coronary artery disease include dyslipidemia. Past treatments include calcium channel blockers. There are no compliance problems.  There is no history of angina.  Hyperlipidemia Pertinent negatives include no chest pain.     Past Medical History:  Diagnosis Date   Arthritis    Bilateral dry eyes    Cancer ([place]) 02/2018   left breast cancer   Cataracts, bilateral    Chronic headache    GERD (gastroesophageal reflux disease)    Hypertension    Personal history of radiation therapy    TMJ syndrome    Vaginal discharge 11/12/2018     Family History  Problem Relation Age of Onset   Pneumonia Mother      Current Outpatient Medications:    Calcium Carb-Cholecalciferol (CALCIUM-VITAMIN D3) 600-400 MG-UNIT TABS, Take by mouth 2 (two) times daily., Disp: , Rfl:    ciclopirox (PENLAC) 8 % solution, Apply topically at bedtime. Apply over nail and surrounding skin. Apply daily over previous coat. After seven (7) days, may remove with  alcohol and continue cycle., Disp: 6.6 mL, Rfl: 0   cycloSPORINE (RESTASIS) 0.05 % ophthalmic emulsion, Place 1 drop into both eyes 2 (two) times daily., Disp: 0.4 mL, Rfl:    Garlic 123XX123 MG CAPS, Take 1,000 mg by mouth daily. , Disp: , Rfl:    Glycerin-Polysorbate 80 (REFRESH DRY EYE THERAPY OP), Place 1-2 drops into both eyes 4 (four) times daily as needed (for dry eyes). , Disp: , Rfl:    ipratropium (ATROVENT) 0.06 % nasal spray, PLACE 2 SPRAYS IN EACH NOSTRIL 4 TIMES DAILY, Disp: 15 mL, Rfl: 1   KRILL OIL OMEGA-3 PO, Take 1 capsule by mouth daily., Disp: , Rfl:    letrozole (FEMARA) 2.5 MG tablet, Take 1 tablet (2.5 mg total) by mouth 3 (three) times a week., Disp: 90 tablet, Rfl: 3   meloxicam (MOBIC) 15 MG tablet, Take 1 tablet (15 mg total) by mouth daily as needed for pain., Disp: 30 tablet, Rfl: 0   mometasone (ELOCON) 0.1 % cream, APPLY TO AFFECTED AREA DAILY, Disp: 45 g, Rfl: 1   olopatadine (PATADAY) 0.1 % ophthalmic solution, Place 1 drop into both eyes 2 (two) times daily., Disp: 5 mL, Rfl: 12   omeprazole (PRILOSEC) 20 MG capsule, TAKE 1 CAPSULE EVERY DAY, Disp: 90 capsule, Rfl: 1   potassium gluconate 595 MG TABS, Take 595 mg by mouth daily., Disp: , Rfl:    vitamin B-12 (CYANOCOBALAMIN) 1000 MCG tablet, Take  1,000 mcg by mouth daily., Disp: , Rfl:    ALPRAZolam (XANAX) 0.25 MG tablet, Take 1 tablet (0.25 mg total) by mouth at bedtime as needed for anxiety., Disp: 10 tablet, Rfl: 0   amLODipine (NORVASC) 5 MG tablet, Take 1 tablet (5 mg total) by mouth daily., Disp: 90 tablet, Rfl: 1   atorvastatin (LIPITOR) 10 MG tablet, Take 1 tablet (10 mg total) by mouth daily., Disp: 90 tablet, Rfl: 1   Magnesium 250 MG TABS, Take 1 tablet (250 mg total) by mouth daily., Disp: 90 tablet, Rfl: 1   Allergies  Allergen Reactions   Tobramycin Itching, Swelling and Rash    Tobramycin eye drops caused swelling, itching, drainage, rash and peeling of the skin      The patient states she is  post menopausal status.  No LMP recorded. Patient is postmenopausal.. Negative for Dysmenorrhea and Negative for Menorrhagia. Negative for: breast discharge, breast lump(s), breast pain and breast self exam. Associated symptoms include abnormal vaginal bleeding. Pertinent negatives include abnormal bleeding (hematology), anxiety, decreased libido, depression, difficulty falling sleep, dyspareunia, history of infertility, nocturia, sexual dysfunction, sleep disturbances, urinary incontinence, urinary urgency, vaginal discharge and vaginal itching. Diet regular; she tries to avoid the sweet drinks and eating more vegetables and fruits. She tries to avoid fried fish. The patient states her exercise level is minimal - goes to gym 2-3 times a week. Continues to volunteer at [employer] so is outside with the children.   The patient's tobacco use is:  Social History   Tobacco Use  Smoking Status Never  Smokeless Tobacco Never   She has been exposed to passive smoke. The patient's alcohol use is:  Social History   Substance and Sexual Activity  Alcohol Use No    Review of Systems  Constitutional: Negative.   HENT: Negative.    Eyes: Negative.   Respiratory: Negative.    Cardiovascular: Negative.  Negative for chest pain and palpitations.  Gastrointestinal: Negative.   Endocrine: Negative.   Genitourinary: Negative.   Musculoskeletal: Negative.   Skin: Negative.   Allergic/Immunologic: Negative.   Neurological: Negative.  Negative for headaches.  Hematological: Negative.   Psychiatric/Behavioral: Negative.       Today's Vitals   11/04/22 1211 11/04/22 1224  BP: (!) 140/68 (!) 140/68  Pulse: 72   Temp: 98.2 F (36.8 C)   TempSrc: Oral   Weight: 162 lb 3.2 oz (73.6 kg)   Height: 5\' 4"  (1.626 m)    Body mass index is 27.84 kg/m.  Wt Readings from Last 3 Encounters:  11/04/22 162 lb 3.2 oz (73.6 kg)  07/27/22 151 lb (68.5 kg)  07/04/22 159 lb (72.1 kg)    Objective:  Physical  Exam Vitals reviewed.  Constitutional:      General: She is not in acute distress.    Appearance: Normal appearance. She is well-developed.  HENT:     Head: Normocephalic and atraumatic.     Right Ear: Hearing, tympanic membrane, ear canal and external ear normal. There is no impacted cerumen.     Left Ear: Hearing, tympanic membrane, ear canal and external ear normal. There is no impacted cerumen.     Nose: Nose normal.     Mouth/Throat:     Mouth: Mucous membranes are moist.     Dentition: Has dentures.  Eyes:     General: Lids are normal.     Extraocular Movements: Extraocular movements intact.     Conjunctiva/sclera: Conjunctivae normal.  Pupils: Pupils are equal, round, and reactive to light.     Funduscopic exam:    Right eye: No papilledema.        Left eye: No papilledema.  Neck:     Thyroid: No thyroid mass.     Vascular: No carotid bruit.  Cardiovascular:     Rate and Rhythm: Normal rate and regular rhythm.     Pulses: Normal pulses.     Heart sounds: Normal heart sounds. No murmur heard. Pulmonary:     Effort: Pulmonary effort is normal. No respiratory distress.     Breath sounds: Normal breath sounds. No wheezing.  Abdominal:     General: Abdomen is flat. Bowel sounds are normal. There is no distension.     Palpations: Abdomen is soft.     Tenderness: There is no abdominal tenderness.  Musculoskeletal:        General: No swelling or tenderness. Normal range of motion.     Cervical back: Full passive range of motion without pain, normal range of motion and neck supple.     Right lower leg: No edema.     Left lower leg: No edema.  Skin:    General: Skin is warm and dry.     Capillary Refill: Capillary refill takes less than 2 seconds.  Neurological:     General: No focal deficit present.     Mental Status: She is alert and oriented to person, place, and time.     Cranial Nerves: No cranial nerve deficit.     Sensory: No sensory deficit.     Motor: No  weakness.  Psychiatric:        Mood and Affect: Mood normal.        Behavior: Behavior normal.        Thought Content: Thought content normal.        Judgment: Judgment normal.         Assessment And Plan:     1. Annual physical exam Behavior modifications discussed and diet history reviewed.   Pt will continue to exercise regularly and modify diet with low GI, plant based foods and decrease intake of processed foods.  Recommend intake of daily multivitamin, Vitamin D, and calcium.  Recommend mammogram for preventive screenings, as well as recommend immunizations that include influenza, TDAP, and Shingles (decline)  2. Herpes zoster vaccination declined Declines shingrix, educated on disease process and is aware if he changes his mind to notify office  3. Essential hypertension Comments: Blood pressure fairly elevated, will increase amlodopine to 5 mg daily, take 2 of 2.5 mg until gone then get 5 mg dose. - EKG 12-Lead - Microalbumin / creatinine urine ratio - POCT urinalysis dipstick - CMP14+EGFR - amLODipine (NORVASC) 5 MG tablet; Take 1 tablet (5 mg total) by mouth daily.  Dispense: 90 tablet; Refill: 1  4. Mixed hyperlipidemia Comments: Cholesterol levels are stable. - Lipid panel - atorvastatin (LIPITOR) 10 MG tablet; Take 1 tablet (10 mg total) by mouth daily.  Dispense: 90 tablet; Refill: 1  5. Abnormal glucose Comments: HgbA1c is stable, continue focusing on diet low in sugar and starches. - Hemoglobin A1c  6. Anxiety Comments: Has had to use intermittently - ALPRAZolam (XANAX) 0.25 MG tablet; Take 1 tablet (0.25 mg total) by mouth at bedtime as needed for anxiety.  Dispense: 10 tablet; Refill: 0  7. Malignant neoplasm of upper-inner quadrant of left breast in female, estrogen receptor positive ([place]) Comments: continue chemo medication, this may be her last  year taking the medication.  8. Other long term (current) drug therapy - CBC   Patient was given  opportunity to ask questions. Patient verbalized understanding of the plan and was able to repeat key elements of the plan. All questions were answered to their satisfaction.   [name], FNP   I, [name], FNP, have reviewed all documentation for this visit. The documentation on 11/04/22 for the exam, diagnosis, procedures, and orders are all accurate and complete.   THE PATIENT IS ENCOURAGED TO PRACTICE SOCIAL DISTANCING DUE TO THE COVID-19 PANDEMIC.

## 2022-11-04 NOTE — Patient Instructions (Signed)

## 2022-11-06 LAB — CMP14+EGFR
ALT: 19 IU/L (ref 0–32)
AST: 24 IU/L (ref 0–40)
Albumin/Globulin Ratio: 2 (ref 1.2–2.2)
Albumin: 4.5 g/dL (ref 3.7–4.7)
Alkaline Phosphatase: 77 IU/L (ref 44–121)
BUN/Creatinine Ratio: 12 (ref 12–28)
BUN: 10 mg/dL (ref 8–27)
Bilirubin Total: 0.6 mg/dL (ref 0.0–1.2)
CO2: 27 mmol/L (ref 20–29)
Calcium: 9.5 mg/dL (ref 8.7–10.3)
Chloride: 104 mmol/L (ref 96–106)
Creatinine, Ser: 0.85 mg/dL (ref 0.57–1.00)
Globulin, Total: 2.3 g/dL (ref 1.5–4.5)
Glucose: 92 mg/dL (ref 70–99)
Potassium: 4.2 mmol/L (ref 3.5–5.2)
Sodium: 143 mmol/L (ref 134–144)
Total Protein: 6.8 g/dL (ref 6.0–8.5)
eGFR: 67 mL/min/{1.73_m2} (ref >59)

## 2022-11-06 LAB — CBC
Hematocrit: 37 % (ref 34.0–46.6)
Hemoglobin: 12 g/dL (ref 11.1–15.9)
MCH: 28.2 pg (ref 26.6–33.0)
MCHC: 32.4 g/dL (ref 31.5–35.7)
MCV: 87 fL (ref 79–97)
Platelets: 246 10*3/uL (ref 150–450)
RBC: 4.25 x10E6/uL (ref 3.77–5.28)
RDW: 13.7 % (ref 11.7–15.4)
WBC: 3.9 10*3/uL (ref 3.4–10.8)

## 2022-11-06 LAB — MICROALBUMIN / CREATININE URINE RATIO
Creatinine, Urine: 73.2 mg/dL
Microalb/Creat Ratio: 17 mg/g creat (ref 0–29)
Microalbumin, Urine: 12.5 ug/mL

## 2022-11-06 LAB — LIPID PANEL
Chol/HDL Ratio: 3.5 ratio (ref 0.0–4.4)
Cholesterol, Total: 220 mg/dL — ABNORMAL HIGH (ref 100–199)
HDL: 62 mg/dL (ref >39)
LDL Chol Calc (NIH): 148 mg/dL — ABNORMAL HIGH (ref 0–99)
Triglycerides: 57 mg/dL (ref 0–149)
VLDL Cholesterol Cal: 10 mg/dL (ref 5–40)

## 2022-11-06 LAB — HEMOGLOBIN A1C
Est. average glucose Bld gHb Est-mCnc: 120 mg/dL
Hgb A1c MFr Bld: 5.8 % — ABNORMAL HIGH (ref 4.8–5.6)

## 2023-02-06 ENCOUNTER — Ambulatory Visit (INDEPENDENT_AMBULATORY_CARE_PROVIDER_SITE_OTHER): Payer: Medicare PPO | Admitting: Nurse Practitioner

## 2023-02-06 ENCOUNTER — Encounter: Payer: Self-pay | Admitting: Nurse Practitioner

## 2023-02-06 VITALS — BP 120/64 | HR 68 | Temp 98.5°F | Ht 64.0 in | Wt 158.4 lb

## 2023-02-06 DIAGNOSIS — C50212 Malignant neoplasm of upper-inner quadrant of left female breast: Secondary | ICD-10-CM

## 2023-02-06 DIAGNOSIS — R7309 Other abnormal glucose: Secondary | ICD-10-CM

## 2023-02-06 DIAGNOSIS — Z2821 Immunization not carried out because of patient refusal: Secondary | ICD-10-CM | POA: Diagnosis not present

## 2023-02-06 DIAGNOSIS — I10 Essential (primary) hypertension: Secondary | ICD-10-CM

## 2023-02-06 DIAGNOSIS — E559 Vitamin D deficiency, unspecified: Secondary | ICD-10-CM | POA: Diagnosis not present

## 2023-02-06 DIAGNOSIS — E782 Mixed hyperlipidemia: Secondary | ICD-10-CM | POA: Diagnosis not present

## 2023-02-06 DIAGNOSIS — Z170 Estrogen receptor positive status [ER+]: Secondary | ICD-10-CM

## 2023-02-06 DIAGNOSIS — E2839 Other primary ovarian failure: Secondary | ICD-10-CM

## 2023-02-06 NOTE — Assessment & Plan Note (Signed)
Declines shingrix, educated on disease process and is aware if he changes his mind to notify office  

## 2023-02-06 NOTE — Assessment & Plan Note (Signed)
HgbA1c is slightly elevated at last visit. Continue focusing on diet low in sugar and starches.

## 2023-02-06 NOTE — Assessment & Plan Note (Signed)
Blood pressure is well controlled, continue current medications.  

## 2023-02-06 NOTE — Assessment & Plan Note (Signed)
Last done in 2018, T score -2.9 to femur.

## 2023-02-06 NOTE — Assessment & Plan Note (Signed)
Currently taking over the counter vitamin d, will check levels to make sure does not need a higher dose.

## 2023-02-06 NOTE — Progress Notes (Signed)
I,Jabriel Vanduyne,acting as a Neurosurgeon for Arnette Felts, FNP.,have documented all relevant documentation on the behalf of Arnette Felts, FNP,as directed by  Arnette Felts, FNP while in the presence of Arnette Felts, FNP.  Subjective:  Patient ID: Dawn Rodriguez , female    DOB: April 09, 1936 , 87 y.o.   MRN: [id number]  Chief Complaint  Patient presents with   Hypertension    HPI  Patient presents today for a bp and chol check, patient reports compliance with medications and has no other concerns today. Patient denies any chest pain, SOB, and headaches. Continues to have cramps to her legs likely related to her anitcancer drug, she will be stopping this medication in December, she is taking 3 days a week.   BP Readings from Last 3 Encounters: 02/06/23 : 120/64 11/04/22 : (!) 140/68 07/04/22 : 108/62       Past Medical History:  Diagnosis Date   Arthritis    Bilateral dry eyes    Cancer ([facility]) 02/2018   left breast cancer   Cataracts, bilateral    Chronic headache    GERD (gastroesophageal reflux disease)    Hypertension    Personal history of radiation therapy    TMJ syndrome    Vaginal discharge 11/12/2018     Family History  Problem Relation Age of Onset   Pneumonia Mother      Current Outpatient Medications:    ALPRAZolam (XANAX) 0.25 MG tablet, Take 1 tablet (0.25 mg total) by mouth at bedtime as needed for anxiety., Disp: 10 tablet, Rfl: 0   amLODipine (NORVASC) 5 MG tablet, Take 1 tablet (5 mg total) by mouth daily., Disp: 90 tablet, Rfl: 1   atorvastatin (LIPITOR) 10 MG tablet, Take 1 tablet (10 mg total) by mouth daily., Disp: 90 tablet, Rfl: 1   Calcium Carb-Cholecalciferol (CALCIUM-VITAMIN D3) 600-400 MG-UNIT TABS, Take by mouth 2 (two) times daily., Disp: , Rfl:    cycloSPORINE (RESTASIS) 0.05 % ophthalmic emulsion, Place 1 drop into both eyes 2 (two) times daily., Disp: 0.4 mL, Rfl:    Garlic 1000 MG CAPS, Take 1,000 mg by mouth daily. , Disp: , Rfl:     Glycerin-Polysorbate 80 (REFRESH DRY EYE THERAPY OP), Place 1-2 drops into both eyes 4 (four) times daily as needed (for dry eyes). , Disp: , Rfl:    KRILL OIL OMEGA-3 PO, Take 1 capsule by mouth daily., Disp: , Rfl:    letrozole (FEMARA) 2.5 MG tablet, Take 1 tablet (2.5 mg total) by mouth 3 (three) times a week., Disp: 90 tablet, Rfl: 3   Magnesium 250 MG TABS, Take 1 tablet (250 mg total) by mouth daily., Disp: 90 tablet, Rfl: 1   olopatadine (PATADAY) 0.1 % ophthalmic solution, Place 1 drop into both eyes 2 (two) times daily., Disp: 5 mL, Rfl: 12   potassium gluconate 595 MG TABS, Take 595 mg by mouth daily., Disp: , Rfl:    vitamin B-12 (CYANOCOBALAMIN) 1000 MCG tablet, Take 1,000 mcg by mouth daily., Disp: , Rfl:    Allergies  Allergen Reactions   Tobramycin Itching, Swelling and Rash    Tobramycin eye drops caused swelling, itching, drainage, rash and peeling of the skin     Review of Systems  Constitutional: Negative.   HENT: Negative.    Eyes: Negative.   Respiratory: Negative.    Cardiovascular: Negative.   Gastrointestinal: Negative.   Musculoskeletal:        Occasional leg cramps.      Today's Vitals  02/06/23 1416  BP: 120/64  Pulse: 68  Temp: 98.5 F (36.9 C)  TempSrc: Oral  Weight: 158 lb 6.4 oz (71.8 kg)  Height: 5\' 4"  (1.626 m)  PainSc: 0-No pain   Body mass index is 27.19 kg/m.  Wt Readings from Last 3 Encounters:  02/06/23 158 lb 6.4 oz (71.8 kg)  11/04/22 162 lb 3.2 oz (73.6 kg)  07/27/22 151 lb (68.5 kg)    The ASCVD Risk score (Arnett DK, et al., 2019) failed to calculate for the following reasons:   The 2019 ASCVD risk score is only valid for ages 73 to 73  Objective:  Physical Exam Vitals reviewed.  Constitutional:      General: She is not in acute distress.    Appearance: Normal appearance. She is normal weight.  Cardiovascular:     Rate and Rhythm: Normal rate and regular rhythm.     Pulses: Normal pulses.     Heart sounds: Normal  heart sounds. No murmur heard. Pulmonary:     Effort: Pulmonary effort is normal. No respiratory distress.     Breath sounds: Normal breath sounds. No wheezing.  Musculoskeletal:        General: No tenderness. Normal range of motion.  Skin:    General: Skin is warm and dry.     Capillary Refill: Capillary refill takes less than 2 seconds.  Neurological:     General: No focal deficit present.     Mental Status: She is alert and oriented to person, place, and time.     Cranial Nerves: No cranial nerve deficit.     Motor: No weakness.  Psychiatric:        Mood and Affect: Mood normal.        Behavior: Behavior normal.        Thought Content: Thought content normal.        Judgment: Judgment normal.         Assessment And Plan:   Problem List Items Addressed This Visit       Cardiovascular and Mediastinum   Essential hypertension - Primary    Blood pressure is well controlled, continue current medications      Relevant Orders   Basic metabolic panel     Other   Malignant neoplasm of upper-inner quadrant of left breast in female, estrogen receptor positive ([facility])    Continue f/u with Oncology, she is to possibly come off her anticancer agent at her 5 year mark in December.       Abnormal glucose    HgbA1c is slightly elevated at last visit. Continue focusing on diet low in sugar and starches.       Mixed hyperlipidemia    Cholesterol levels (total) elevated at last visit. Continue statin. Tolerating well.       Relevant Orders   Lipid panel   Herpes zoster vaccination declined    Declines shingrix, educated on disease process and is aware if he changes his mind to notify office        Vitamin D deficiency    Currently taking over the counter vitamin d, will check levels to make sure does not need a higher dose.       Relevant Orders   Vitamin D (25 hydroxy)   Decreased estrogen level    Last done in 2018, T score -2.9 to femur.       Relevant Orders   DG  Bone Density    Return for 6 month bp check.  Patient was given opportunity to ask questions. Patient verbalized understanding of the plan and was able to repeat key elements of the plan. All questions were answered to their satisfaction.   Arnette Felts, FNP  I, Arnette Felts, FNP, have reviewed all documentation for this visit. The documentation on 02/06/23 for the exam, diagnosis, procedures, and orders are all accurate and complete.   IF YOU HAVE BEEN REFERRED TO A SPECIALIST, IT MAY TAKE 1-2 WEEKS TO SCHEDULE/PROCESS THE REFERRAL. IF YOU HAVE NOT HEARD FROM US/SPECIALIST IN TWO WEEKS, PLEASE GIVE [place] A CALL AT [phone].

## 2023-02-06 NOTE — Assessment & Plan Note (Signed)
Cholesterol levels (total) elevated at last visit. Continue statin. Tolerating well.

## 2023-02-06 NOTE — Assessment & Plan Note (Addendum)
Continue f/u with Oncology, she is to possibly come off her anticancer agent at her 5 year mark in December.

## 2023-02-16 ENCOUNTER — Other Ambulatory Visit: Payer: Self-pay | Admitting: Nurse Practitioner

## 2023-02-16 ENCOUNTER — Ambulatory Visit: Payer: Medicare PPO | Admitting: Hematology and Oncology

## 2023-03-03 ENCOUNTER — Other Ambulatory Visit: Payer: Self-pay | Admitting: Hematology and Oncology

## 2023-04-25 ENCOUNTER — Other Ambulatory Visit: Payer: Self-pay | Admitting: Nurse Practitioner

## 2023-04-25 DIAGNOSIS — Z1231 Encounter for screening mammogram for malignant neoplasm of breast: Secondary | ICD-10-CM

## 2023-04-27 ENCOUNTER — Inpatient Hospital Stay: Admission: RE | Admit: 2023-04-27 | Payer: Medicare PPO | Source: Ambulatory Visit

## 2023-04-27 ENCOUNTER — Encounter: Payer: Self-pay | Admitting: Family Medicine

## 2023-04-27 ENCOUNTER — Ambulatory Visit
Admission: RE | Admit: 2023-04-27 | Discharge: 2023-04-27 | Disposition: A | Discharge status: Home / Self Care | Payer: Medicare PPO | Source: Ambulatory Visit | Attending: Nurse Practitioner | Admitting: Nurse Practitioner

## 2023-04-27 ENCOUNTER — Ambulatory Visit: Payer: Medicare PPO | Admitting: Family Medicine

## 2023-04-27 VITALS — BP 120/70 | HR 85 | Temp 98.1°F | Ht 64.0 in | Wt 157.0 lb

## 2023-04-27 DIAGNOSIS — K6289 Other specified diseases of anus and rectum: Secondary | ICD-10-CM | POA: Diagnosis not present

## 2023-04-27 DIAGNOSIS — E349 Endocrine disorder, unspecified: Secondary | ICD-10-CM | POA: Diagnosis not present

## 2023-04-27 DIAGNOSIS — R82998 Other abnormal findings in urine: Secondary | ICD-10-CM

## 2023-04-27 DIAGNOSIS — M8588 Other specified disorders of bone density and structure, other site: Secondary | ICD-10-CM | POA: Diagnosis not present

## 2023-04-27 DIAGNOSIS — R300 Dysuria: Secondary | ICD-10-CM | POA: Diagnosis not present

## 2023-04-27 DIAGNOSIS — Z853 Personal history of malignant neoplasm of breast: Secondary | ICD-10-CM | POA: Diagnosis not present

## 2023-04-27 DIAGNOSIS — E2839 Other primary ovarian failure: Secondary | ICD-10-CM

## 2023-04-27 DIAGNOSIS — L989 Disorder of the skin and subcutaneous tissue, unspecified: Secondary | ICD-10-CM

## 2023-04-27 DIAGNOSIS — N958 Other specified menopausal and perimenopausal disorders: Secondary | ICD-10-CM | POA: Diagnosis not present

## 2023-04-27 LAB — POCT URINALYSIS DIPSTICK
Bilirubin, UA: NEGATIVE
Blood, UA: NEGATIVE
Glucose, UA: NEGATIVE
Ketones, UA: NEGATIVE
Nitrite, UA: NEGATIVE
Protein, UA: NEGATIVE
Spec Grav, UA: 1.02 (ref 1.010–1.025)
Urobilinogen, UA: 0.2 U/dL
pH, UA: 5.5 (ref 5.0–8.0)

## 2023-04-27 MED ORDER — HYDROCORTISONE (PERIANAL) 2.5 % EX CREA: 1.0000 | TOPICAL_CREAM | Freq: Two times a day (BID) | CUTANEOUS | 1 refills | Status: DC

## 2023-04-27 NOTE — Progress Notes (Signed)
I,Jameka J Llittleton, CMA,acting as a Neurosurgeon for [employer], NP.,have documented all relevant documentation on the behalf of Ellender Hose, NP,as directed by  Ellender Hose, NP while in the presence of Ellender Hose, NP.  Subjective:  Patient ID: Dawn Rodriguez , female    DOB: Oct 15, 1935 , 87 y.o.   MRN: [id number]  Chief Complaint  Patient presents with   Mass    HPI  Patient presents today for an itching bump on her bottom that appeared on 8/18. She reported she felt eery when she seen it. She reported the bump is just about gone now but you can see where it was initially. Patient states that she could not tell if it was hemorrhoid? Or not but at times it felt like one. She denies any fever, pain etc. Patient also c/o slight discomfort with urination., denies flank pain or any incontinence episodes.     Past Medical History:  Diagnosis Date   Arthritis    Bilateral dry eyes    Cancer ([facility]) 02/2018   left breast cancer   Cataracts, bilateral    Chronic headache    GERD (gastroesophageal reflux disease)    Hypertension    Personal history of radiation therapy    TMJ syndrome    Vaginal discharge 11/12/2018     Family History  Problem Relation Age of Onset   Pneumonia Mother      Current Outpatient Medications:    ALPRAZolam (XANAX) 0.25 MG tablet, Take 1 tablet (0.25 mg total) by mouth at bedtime as needed for anxiety., Disp: 10 tablet, Rfl: 0   amLODipine (NORVASC) 5 MG tablet, Take 1 tablet (5 mg total) by mouth daily., Disp: 90 tablet, Rfl: 1   atorvastatin (LIPITOR) 10 MG tablet, Take 1 tablet (10 mg total) by mouth daily., Disp: 90 tablet, Rfl: 1   Calcium Carb-Cholecalciferol (CALCIUM-VITAMIN D3) 600-400 MG-UNIT TABS, Take by mouth 2 (two) times daily., Disp: , Rfl:    cycloSPORINE (RESTASIS) 0.05 % ophthalmic emulsion, Place 1 drop into both eyes 2 (two) times daily., Disp: 0.4 mL, Rfl:    Garlic 1000 MG CAPS, Take 1,000 mg by mouth daily. , Disp: , Rfl:     Glycerin-Polysorbate 80 (REFRESH DRY EYE THERAPY OP), Place 1-2 drops into both eyes 4 (four) times daily as needed (for dry eyes). , Disp: , Rfl:    hydrocortisone (ANUSOL-HC) 2.5 % rectal cream, Place 1 Application rectally 2 (two) times daily., Disp: 30 g, Rfl: 1   KRILL OIL OMEGA-3 PO, Take 1 capsule by mouth daily., Disp: , Rfl:    letrozole (FEMARA) 2.5 MG tablet, TAKE 1 TABLET BY MOUTH EVERY DAY, Disp: 90 tablet, Rfl: 3   Magnesium 250 MG TABS, Take 1 tablet (250 mg total) by mouth daily., Disp: 90 tablet, Rfl: 1   olopatadine (PATADAY) 0.1 % ophthalmic solution, Place 1 drop into both eyes 2 (two) times daily., Disp: 5 mL, Rfl: 12   potassium gluconate 595 MG TABS, Take 595 mg by mouth daily., Disp: , Rfl:    vitamin B-12 (CYANOCOBALAMIN) 1000 MCG tablet, Take 1,000 mcg by mouth daily., Disp: , Rfl:    Allergies  Allergen Reactions   Tobramycin Itching, Swelling and Rash    Tobramycin eye drops caused swelling, itching, drainage, rash and peeling of the skin     Review of Systems  Constitutional: Negative.   Eyes: Negative.   Respiratory: Negative.    Genitourinary:  Positive for dysuria.  Musculoskeletal: Negative.   Skin:  Rash in the rectal area has resolved  Neurological: Negative.   Psychiatric/Behavioral: Negative.       Today's Vitals   04/27/23 1436  BP: 120/70  Pulse: 85  Temp: 98.1 F (36.7 C)  Weight: 157 lb (71.2 kg)  Height: 5\' 4"  (1.626 m)  PainSc: 0-No pain   Body mass index is 26.95 kg/m.  Wt Readings from Last 3 Encounters:  04/27/23 157 lb (71.2 kg)  02/06/23 158 lb 6.4 oz (71.8 kg)  11/04/22 162 lb 3.2 oz (73.6 kg)     Objective:  Physical Exam Exam conducted with a chaperone present.  Cardiovascular:     Rate and Rhythm: Normal rate and regular rhythm.  Abdominal:     General: Bowel sounds are normal.  Genitourinary:    Rectum: Normal. No tenderness or anal fissure.  Skin:    General: Skin is warm and dry.     Findings: Rash  present.  Neurological:     General: No focal deficit present.     Mental Status: She is alert and oriented to person, place, and time.         Assessment And Plan:  Rectal pain -     Hydrocortisone (Perianal); Place 1 Application rectally 2 (two) times daily.  Dispense: 30 g; Refill: 1  Dysuria -     POCT urinalysis dipstick  Leukocytes in urine -     Urine Culture    Return if symptoms worsen or fail to improve, for keep Appt.  Patient was given opportunity to ask questions. Patient verbalized understanding of the plan and was able to repeat key elements of the plan. All questions were answered to their satisfaction.    I, Ellender Hose, NP, have reviewed all documentation for this visit. The documentation on 05/10/23 for the exam, diagnosis, procedures, and orders are all accurate and complete.   IF YOU HAVE BEEN REFERRED TO A SPECIALIST, IT MAY TAKE 1-2 WEEKS TO SCHEDULE/PROCESS THE REFERRAL. IF YOU HAVE NOT HEARD FROM US/SPECIALIST IN TWO WEEKS, PLEASE GIVE [place] A CALL AT [phone].

## 2023-04-29 LAB — URINE CULTURE

## 2023-05-10 ENCOUNTER — Ambulatory Visit
Admission: RE | Admit: 2023-05-10 | Discharge: 2023-05-10 | Disposition: A | Discharge status: Home / Self Care | Payer: Medicare PPO | Source: Ambulatory Visit | Attending: Nurse Practitioner | Admitting: Nurse Practitioner

## 2023-05-10 DIAGNOSIS — K6289 Other specified diseases of anus and rectum: Secondary | ICD-10-CM | POA: Insufficient documentation

## 2023-05-10 DIAGNOSIS — R82998 Other abnormal findings in urine: Secondary | ICD-10-CM | POA: Insufficient documentation

## 2023-05-10 DIAGNOSIS — Z1231 Encounter for screening mammogram for malignant neoplasm of breast: Secondary | ICD-10-CM | POA: Diagnosis not present

## 2023-05-10 DIAGNOSIS — R300 Dysuria: Secondary | ICD-10-CM | POA: Insufficient documentation

## 2023-05-10 DIAGNOSIS — L989 Disorder of the skin and subcutaneous tissue, unspecified: Secondary | ICD-10-CM | POA: Insufficient documentation

## 2023-05-16 DIAGNOSIS — E782 Mixed hyperlipidemia: Secondary | ICD-10-CM | POA: Diagnosis not present

## 2023-05-16 DIAGNOSIS — Z86010 Personal history of colonic polyps: Secondary | ICD-10-CM | POA: Diagnosis not present

## 2023-05-16 DIAGNOSIS — I10 Essential (primary) hypertension: Secondary | ICD-10-CM | POA: Diagnosis not present

## 2023-05-16 DIAGNOSIS — K5900 Constipation, unspecified: Secondary | ICD-10-CM | POA: Diagnosis not present

## 2023-05-16 DIAGNOSIS — K5730 Diverticulosis of large intestine without perforation or abscess without bleeding: Secondary | ICD-10-CM | POA: Diagnosis not present

## 2023-06-04 ENCOUNTER — Other Ambulatory Visit: Payer: Self-pay | Admitting: Nurse Practitioner

## 2023-06-04 DIAGNOSIS — I10 Essential (primary) hypertension: Secondary | ICD-10-CM

## 2023-07-14 ENCOUNTER — Ambulatory Visit (INDEPENDENT_AMBULATORY_CARE_PROVIDER_SITE_OTHER): Payer: Medicare PPO

## 2023-07-14 ENCOUNTER — Encounter (HOSPITAL_COMMUNITY): Payer: Self-pay

## 2023-07-14 ENCOUNTER — Ambulatory Visit (HOSPITAL_COMMUNITY)
Admission: EM | Admit: 2023-07-14 | Discharge: 2023-07-14 | Disposition: A | Discharge status: Home / Self Care | Payer: Medicare PPO | Attending: Internal Medicine | Admitting: Internal Medicine

## 2023-07-14 DIAGNOSIS — M545 Low back pain, unspecified: Secondary | ICD-10-CM | POA: Diagnosis not present

## 2023-07-14 DIAGNOSIS — M5136 Other intervertebral disc degeneration, lumbar region with discogenic back pain only: Secondary | ICD-10-CM | POA: Diagnosis not present

## 2023-07-14 DIAGNOSIS — I700 Atherosclerosis of aorta: Secondary | ICD-10-CM | POA: Diagnosis not present

## 2023-07-14 NOTE — ED Provider Notes (Signed)
[facility]    CSN: [id number] Arrival date & time: 07/14/23  1227      History   Chief Complaint Chief Complaint  Patient presents with   Back Pain    HPI Dawn Rodriguez is a 87 y.o. female.    Back Pain Associated symptoms: no abdominal pain, no chest pain, no dysuria, no fever, no numbness and no weakness   Back pain left side greater than right, gradual onset last p.m. around 11.  Took half of an 800 mg ibuprofen was able to sleep had some relief.  This morning however pain recurred, pain is constant, left greater than right, sharp with certain movements, especially twisting, and nonradiating. Has done some lifting of Christmas decorations and clothing out of her storage unit earlier this week but denies a specific injury.  Went to [employer] yesterday but did not lift anything heavy.  Denies history of similar back pain in the past. Denies abdominal pain, nausea, vomiting, diarrhea, urinary symptoms, paresthesias, weakness. Past medical history includes breast cancer arthritis, GERD, hypertension.  Allergy to tobramycin  Past Medical History:  Diagnosis Date   Arthritis    Bilateral dry eyes    Cancer (HCC) 02/2018   left breast cancer   Cataracts, bilateral    Chronic headache    GERD (gastroesophageal reflux disease)    Hypertension    Personal history of radiation therapy    TMJ syndrome    Vaginal discharge 11/12/2018    Patient Active Problem List   Diagnosis Date Noted   Bumps on skin 05/10/2023   Dysuria 05/10/2023   Rectal pain 05/10/2023   Leukocytes in urine 05/10/2023   Herpes zoster vaccination declined 02/06/2023   Vitamin D deficiency 02/06/2023   Decreased estrogen level 02/06/2023   Anxiety 11/04/2022   Abnormal glucose 11/04/2022   Mixed hyperlipidemia 11/04/2022   Essential hypertension 11/04/2022   Encounter for Papanicolaou smear of cervix 11/12/2018   Malignant neoplasm of upper-inner quadrant of left breast in female,  estrogen receptor positive (HCC) 03/20/2018   Cataracts, bilateral    GERD (gastroesophageal reflux disease)    Osteoarthritis 10/04/2007   Internal hemorrhoids 09/14/2007   Diverticulosis of colon 09/14/2007    Past Surgical History:  Procedure Laterality Date   BREAST BIOPSY Right 2012   stereotatic biopsy   BREAST BIOPSY Left 03/14/2018   BREAST BIOPSY Right 03/19/2020   BREAST LUMPECTOMY Left 04/06/2018   Procedure: LEFT BREAST LUMPECTOMY;  Surgeon: Glenna Fellows, MD;  Location: [facility];  Service: General;  Laterality: Left;   CATARACT EXTRACTION      OB History   No obstetric history on file.      Home Medications    Prior to Admission medications   Medication Sig Start Date End Date Taking? Authorizing Provider  amLODipine (NORVASC) 5 MG tablet TAKE 1 TABLET (5 MG TOTAL) BY MOUTH DAILY. 06/05/23  Yes Arnette Felts, FNP  atorvastatin (LIPITOR) 10 MG tablet Take 1 tablet (10 mg total) by mouth daily. 11/04/22  Yes Arnette Felts, FNP  Calcium Carb-Cholecalciferol (CALCIUM-VITAMIN D3) 600-400 MG-UNIT TABS Take by mouth 2 (two) times daily.   Yes [provider]  cycloSPORINE (RESTASIS) 0.05 % ophthalmic emulsion Place 1 drop into both eyes 2 (two) times daily. 12/31/20  Yes Serena Croissant, MD  Garlic 1000 MG CAPS Take 1,000 mg by mouth daily.    Yes [provider]  Glycerin-Polysorbate 80 (REFRESH DRY EYE THERAPY OP) Place 1-2 drops into both eyes 4 (  four) times daily as needed (for dry eyes).    Yes [provider]  hydrocortisone (ANUSOL-HC) 2.5 % rectal cream Place 1 Application rectally 2 (two) times daily. 04/27/23  Yes Ellender Hose, NP  KRILL OIL OMEGA-3 PO Take 1 capsule by mouth daily.   Yes [provider]  letrozole (FEMARA) 2.5 MG tablet TAKE 1 TABLET BY MOUTH EVERY DAY 03/06/23  Yes Serena Croissant, MD  Magnesium 250 MG TABS Take 1 tablet (250 mg total) by mouth daily. 11/04/22  Yes Arnette Felts, FNP  olopatadine  (PATADAY) 0.1 % ophthalmic solution Place 1 drop into both eyes 2 (two) times daily. 12/31/20  Yes Serena Croissant, MD  potassium gluconate 595 MG TABS Take 595 mg by mouth daily.   Yes [provider]  vitamin B-12 (CYANOCOBALAMIN) 1000 MCG tablet Take 1,000 mcg by mouth daily.   Yes [provider]  ALPRAZolam (XANAX) 0.25 MG tablet Take 1 tablet (0.25 mg total) by mouth at bedtime as needed for anxiety. 11/04/22   Arnette Felts, FNP    Family History Family History  Problem Relation Age of Onset   Pneumonia Mother     Social History Social History   Tobacco Use   Smoking status: Never   Smokeless tobacco: Never  Vaping Use   Vaping status: Never Used  Substance Use Topics   Alcohol use: No   Drug use: No     Allergies   Tobramycin   Review of Systems Review of Systems  Constitutional:  Negative for appetite change and fever.  Respiratory:  Negative for shortness of breath.   Cardiovascular:  Negative for chest pain.  Gastrointestinal:  Negative for abdominal pain, diarrhea, nausea and vomiting.  Genitourinary:  Negative for dysuria, frequency and urgency.  Musculoskeletal:  Positive for back pain.  Skin:  Negative for rash.  Neurological:  Negative for weakness and numbness.     Physical Exam Triage Vital Signs ED Triage Vitals  Encounter Vitals Group     BP 07/14/23 1325 (!) 147/76     Systolic BP Percentile --      Diastolic BP Percentile --      Pulse Rate 07/14/23 1325 64     Resp 07/14/23 1325 16     Temp 07/14/23 1325 97.7 F (36.5 C)     Temp Source 07/14/23 1325 Oral     SpO2 07/14/23 1325 96 %     Weight 07/14/23 1324 154 lb (69.9 kg)     Height 07/14/23 1324 5\' 4"  (1.626 m)     Head Circumference --      Peak Flow --      Pain Score 07/14/23 1322 10     Pain Loc --      Pain Education --      Exclude from Growth Chart --    No data found.  Updated Vital Signs BP (!) 147/76 (BP Location: Left Arm)   Pulse 64   Temp 97.7  F (36.5 C) (Oral)   Resp 16   Ht 5\' 4"  (1.626 m)   Wt 154 lb (69.9 kg)   SpO2 96%   BMI 26.43 kg/m   Visual Acuity Right Eye Distance:   Left Eye Distance:   Bilateral Distance:    Right Eye Near:   Left Eye Near:    Bilateral Near:     Physical Exam Vitals and nursing note reviewed.  Constitutional:      Appearance: Normal appearance. She is not ill-appearing.  Comments: Sitting in a wheelchair, winces with movement  HENT:     Head: Normocephalic.  Eyes:     Conjunctiva/sclera: Conjunctivae normal.  Cardiovascular:     Rate and Rhythm: Normal rate and regular rhythm.     Heart sounds: Normal heart sounds.  Pulmonary:     Effort: Pulmonary effort is normal.     Breath sounds: Normal breath sounds. No wheezing.  Neurological:     Mental Status: She is alert and oriented to person, place, and time.     Motor: No weakness.  Psychiatric:        Mood and Affect: Mood normal.      UC Treatments / Results  Labs (all labs ordered are listed, but only abnormal results are displayed) Labs Reviewed - No data to display  EKG   Radiology No results found.  Procedures Procedures (including critical care time)  Medications Ordered in UC Medications - No data to display  Initial Impression / Assessment and Plan / UC Course  I have reviewed the triage vital signs and the nursing notes.  Pertinent labs & imaging results that were available during my care of the patient were reviewed by me and considered in my medical decision making (see chart for details).     87 year old with nontraumatic low back pain onset last p.m.  She has no bony tenderness on exam however has a lot of pain with movement.  No red flags.  LS-spine x-ray independently viewed by me loss of height L2  consistent with compression fracture seen on previous x-ray dated 12/14/2021, Fuhs degenerative changes  Final Clinical Impressions(s) / UC Diagnoses   Final diagnoses:  None   Discharge  Instructions   None    ED Prescriptions   None    PDMP not reviewed this encounter.   Meliton Rattan, [place] 07/14/23 1440

## 2023-07-14 NOTE — Discharge Instructions (Addendum)
Pick up over-the-counter lidocaine 4 % pain patch Over-the-counter extra strength Tylenol 2 tablets every 6 hours Follow-up with your doctor  The x-ray reading we discussed is preliminary. Your x-ray will be read by a radiologist in next few hours. If there is a discrepancy, you will be contacted, and instructed on a new plan for you care.

## 2023-07-14 NOTE — ED Triage Notes (Signed)
Back Spasm with any movement that shoots through her lower back. Onset this morning, last night was just minor pain.   Took half of a 800 mg ibuprofen with slight relief last night. Took an entire 800 mg Ibuprofen and hot shower with moderate relief but pain came back 30 minutes after that.   No urinary symptoms.

## 2023-07-25 DIAGNOSIS — H1132 Conjunctival hemorrhage, left eye: Secondary | ICD-10-CM | POA: Diagnosis not present

## 2023-08-07 ENCOUNTER — Inpatient Hospital Stay: Payer: Medicare PPO | Attending: Hematology and Oncology | Admitting: Hematology and Oncology

## 2023-08-07 VITALS — BP 139/74 | HR 76 | Temp 97.4°F | Resp 18 | Ht 64.0 in | Wt 161.5 lb

## 2023-08-07 DIAGNOSIS — Z923 Personal history of irradiation: Secondary | ICD-10-CM | POA: Diagnosis not present

## 2023-08-07 DIAGNOSIS — C50212 Malignant neoplasm of upper-inner quadrant of left female breast: Secondary | ICD-10-CM | POA: Insufficient documentation

## 2023-08-07 DIAGNOSIS — Z79811 Long term (current) use of aromatase inhibitors: Secondary | ICD-10-CM | POA: Insufficient documentation

## 2023-08-07 DIAGNOSIS — M8580 Other specified disorders of bone density and structure, unspecified site: Secondary | ICD-10-CM | POA: Diagnosis not present

## 2023-08-07 DIAGNOSIS — Z170 Estrogen receptor positive status [ER+]: Secondary | ICD-10-CM | POA: Insufficient documentation

## 2023-08-07 NOTE — Progress Notes (Signed)
Patient Care Team: Arnette Felts, FNP as PCP - General (General Practice) Glenna Fellows, MD (Inactive) as Consulting Physician (General Surgery) Serena Croissant, MD as Consulting Physician (Hematology and Oncology) Antony Blackbird, MD as Consulting Physician (Radiation Oncology)  DIAGNOSIS:  Encounter Diagnosis  Name Primary?   Malignant neoplasm of upper-inner quadrant of left breast in female, estrogen receptor positive (HCC) Yes    SUMMARY OF ONCOLOGIC HISTORY: Oncology History  Malignant neoplasm of upper-inner quadrant of left breast in female, estrogen receptor positive (HCC)  03/14/2018 Initial Diagnosis   Screening detected left breast mass 2.3 cm at 9:30 position biopsy revealed invasive ductal carcinoma grade 2 ER 90%, PR 5%, Ki-67 5%, HER-2 negative ratio 1.19, axilla negative, T2N0 stage Ib clinical stage   03/21/2018 Cancer Staging   Staging form: Breast, AJCC 8th Edition - Clinical stage from 03/21/2018: Stage IB (cT2, cN0, cM0, G2, ER+, PR+, HER2-) - Signed by Serena Croissant, MD on 03/21/2018   04/06/2018 Surgery   Left lumpectomy: IDC with extravasated mucin, grade 2, 2.8 cm, intermediate grade DCIS, margins negative, ER 90%, PR 5%, HER-2 negative ratio 1.19, Ki-67 5%, T2N0 stage IA AJCC 8   04/25/2018 Cancer Staging   Staging form: Breast, AJCC 8th Edition - Pathologic: Stage IA (pT2, pN0, cM0, G2, ER+, PR+, HER2-) - Signed by Loa Socks, NP on 04/25/2018   05/16/2018 - 06/11/2018 Radiation Therapy   Adjuvant radiation therapy   07/2018 -  Anti-estrogen oral therapy   Letrozole daily      CHIEF COMPLIANT: Follow-up on letrozole after completion of therapy  HISTORY OF PRESENT ILLNESS:   History of Present Illness   Dawn Rodriguez, a patient with a history of hypertension and osteopenia, presents for a follow-up visit after completing anti estrogen therapy. She was on the medication three times a week and experienced side effects in the last year of treatment.  She is also on a hypertension medication, which was recently increased from 2.5 to 5 due to fluctuating blood pressure levels.  Dawn Rodriguez has been diligent with her mammograms, with the most recent one in September showing no abnormalities. Her bone density, while not normal, is in the osteopenia range with a score of -2. She has seen improvement in her bone density compared to 2018, which she attributes to resuming calcium supplements after a period of not taking them.  She also mentions a previous pain, which she now believes was due to a seatbelt rubbing against her and an ill-fitting bra. She has not felt this pain recently.         ALLERGIES:  is allergic to tobramycin.  MEDICATIONS:  Current Outpatient Medications  Medication Sig Dispense Refill   amLODipine (NORVASC) 5 MG tablet TAKE 1 TABLET (5 MG TOTAL) BY MOUTH DAILY. 90 tablet 1   atorvastatin (LIPITOR) 10 MG tablet Take 1 tablet (10 mg total) by mouth daily. 90 tablet 1   Calcium Carb-Cholecalciferol (CALCIUM-VITAMIN D3) 600-400 MG-UNIT TABS Take by mouth 2 (two) times daily.     cycloSPORINE (RESTASIS) 0.05 % ophthalmic emulsion Place 1 drop into both eyes 2 (two) times daily. 0.4 mL    Garlic 1000 MG CAPS Take 1,000 mg by mouth daily.      Glycerin-Polysorbate 80 (REFRESH DRY EYE THERAPY OP) Place 1-2 drops into both eyes 4 (four) times daily as needed (for dry eyes).      hydrocortisone (ANUSOL-HC) 2.5 % rectal cream Place 1 Application rectally 2 (two) times daily. 30 g 1   KRILL OIL  OMEGA-3 PO Take 1 capsule by mouth daily.     Magnesium 250 MG TABS Take 1 tablet (250 mg total) by mouth daily. 90 tablet 1   olopatadine (PATADAY) 0.1 % ophthalmic solution Place 1 drop into both eyes 2 (two) times daily. 5 mL 12   potassium gluconate 595 MG TABS Take 595 mg by mouth daily.     vitamin B-12 (CYANOCOBALAMIN) 1000 MCG tablet Take 1,000 mcg by mouth daily.     No current facility-administered medications for this visit.     PHYSICAL EXAMINATION: ECOG PERFORMANCE STATUS: 1 - Symptomatic but completely ambulatory  Vitals:   08/07/23 1416  BP: 139/74  Pulse: 76  Resp: 18  Temp: (!) 97.4 F (36.3 C)  SpO2: 100%   Filed Weights   08/07/23 1416  Weight: 161 lb 8 oz (73.3 kg)    Physical Exam   MEASUREMENTS: Ht- 5'4"      (exam performed in the presence of a chaperone)  LABORATORY DATA:  I have reviewed the data as listed    Latest Ref Rng & Units 11/04/2022    1:04 PM 07/04/2022    4:25 PM 03/01/2022    3:27 PM  CMP  Glucose 70 - 99 mg/dL 92  829  92   BUN 8 - 27 mg/dL 10  7  12    Creatinine 0.57 - 1.00 mg/dL 5.62  1.30  8.65   Sodium 134 - 144 mmol/L 143  143  142   Potassium 3.5 - 5.2 mmol/L 4.2  3.9  4.3   Chloride 96 - 106 mmol/L 104  104  104   CO2 20 - 29 mmol/L 27  25  25    Calcium 8.7 - 10.3 mg/dL 9.5  9.3  9.4   Total Protein 6.0 - 8.5 g/dL 6.8     Total Bilirubin 0.0 - 1.2 mg/dL 0.6     Alkaline Phos 44 - 121 IU/L 77     AST 0 - 40 IU/L 24     ALT 0 - 32 IU/L 19       Lab Results  Component Value Date   WBC 3.9 11/04/2022   HGB 12.0 11/04/2022   HCT 37.0 11/04/2022   MCV 87 11/04/2022   PLT 246 11/04/2022   NEUTROABS 1.5 03/21/2018    ASSESSMENT & PLAN:  Malignant neoplasm of upper-inner quadrant of left breast in female, estrogen receptor positive (HCC) 04/06/2018:Left lumpectomy: IDC with extravasated mucin, grade 2, 2.8 cm, intermediate grade DCIS, margins negative, ER 90%, PR 5%, HER-2 negative ratio 1.19, Ki-67 5%, T2N0 stage IA AJCC 8 Adjuvant radiation therapy 05/16/2018 to 06/11/2018   Treatment plan: Letrozole 2.5 mg daily started 07/16/2018, switching to 3 times a week starting 07/04/2022.   Letrozole toxicities:  Severe bilateral leg cramping Generalized pruritus   We discussed options including switching to anastrozole but she does not want to do that.  She wants to rather take the letrozole 3 times a week.   Osteoporosis: T score -2.9: on Fosamax.   She takes calcium and vitamin D.   Right breast discomfort:  03/12/2020: Mammogram and ultrasound: Indeterminate calcifications 1.5 cm right breast: Biopsy: Fibroadenomatoid change.   Breast cancer surveillance: 1.  Mammogram 01/27/21: Benign, density cat C 2.  Breast exam 08/07/2023: Benign   Return to clinic on an as-needed basis    No orders of the defined types were placed in this encounter.  The patient has a good understanding of the overall plan. she agrees  with it. she will call with any problems that may develop before the next visit here. Total time spent: 30 mins including face to face time and time spent for planning, charting and co-ordination of care   Tamsen Meek, MD 08/07/23

## 2023-08-07 NOTE — Assessment & Plan Note (Addendum)
04/06/2018:Left lumpectomy: IDC with extravasated mucin, grade 2, 2.8 cm, intermediate grade DCIS, margins negative, ER 90%, PR 5%, HER-2 negative ratio 1.19, Ki-67 5%, T2N0 stage IA AJCC 8 Adjuvant radiation therapy 05/16/2018 to 06/11/2018   Treatment plan: Letrozole 2.5 mg daily started 07/16/2018, switching to 3 times a week starting 07/04/2022.   Letrozole toxicities:  Severe bilateral leg cramping Generalized pruritus   We discussed options including switching to anastrozole but she does not want to do that.  She wants to rather take the letrozole 3 times a week.   Osteoporosis: T score -2.9: on Fosamax.  She takes calcium and vitamin D.   Right breast discomfort:  03/12/2020: Mammogram and ultrasound: Indeterminate calcifications 1.5 cm right breast: Biopsy: Fibroadenomatoid change.   Breast cancer surveillance: 1.  Mammogram 01/27/21: Benign, density cat C 2.  Breast exam 08/07/2023: Benign   Return to clinic on an as-needed basis

## 2023-08-30 ENCOUNTER — Ambulatory Visit (INDEPENDENT_AMBULATORY_CARE_PROVIDER_SITE_OTHER): Payer: Medicare HMO | Admitting: Nurse Practitioner

## 2023-08-30 ENCOUNTER — Encounter: Payer: Self-pay | Admitting: Nurse Practitioner

## 2023-08-30 ENCOUNTER — Ambulatory Visit (INDEPENDENT_AMBULATORY_CARE_PROVIDER_SITE_OTHER): Payer: Medicare HMO

## 2023-08-30 VITALS — BP 110/60 | HR 70 | Temp 97.7°F | Ht 64.0 in | Wt 162.0 lb

## 2023-08-30 DIAGNOSIS — I700 Atherosclerosis of aorta: Secondary | ICD-10-CM | POA: Insufficient documentation

## 2023-08-30 DIAGNOSIS — R7309 Other abnormal glucose: Secondary | ICD-10-CM

## 2023-08-30 DIAGNOSIS — I10 Essential (primary) hypertension: Secondary | ICD-10-CM | POA: Diagnosis not present

## 2023-08-30 DIAGNOSIS — E559 Vitamin D deficiency, unspecified: Secondary | ICD-10-CM | POA: Diagnosis not present

## 2023-08-30 DIAGNOSIS — R202 Paresthesia of skin: Secondary | ICD-10-CM

## 2023-08-30 DIAGNOSIS — E782 Mixed hyperlipidemia: Secondary | ICD-10-CM

## 2023-08-30 DIAGNOSIS — Z2821 Immunization not carried out because of patient refusal: Secondary | ICD-10-CM

## 2023-08-30 DIAGNOSIS — Z853 Personal history of malignant neoplasm of breast: Secondary | ICD-10-CM

## 2023-08-30 DIAGNOSIS — M8589 Other specified disorders of bone density and structure, multiple sites: Secondary | ICD-10-CM

## 2023-08-30 DIAGNOSIS — Z0000 Encounter for general adult medical examination without abnormal findings: Secondary | ICD-10-CM | POA: Diagnosis not present

## 2023-08-30 DIAGNOSIS — Z6827 Body mass index (BMI) 27.0-27.9, adult: Secondary | ICD-10-CM

## 2023-08-30 DIAGNOSIS — M150 Primary generalized (osteo)arthritis: Secondary | ICD-10-CM | POA: Diagnosis not present

## 2023-08-30 DIAGNOSIS — E663 Overweight: Secondary | ICD-10-CM | POA: Diagnosis not present

## 2023-08-30 NOTE — Progress Notes (Addendum)
 LILLETTE Kristeen JINNY [name], CMA,acting as a neurosurgeon for Dawn Ada, FNP.,have documented all relevant documentation on the behalf of Dawn Ada, FNP,as directed by  Dawn Ada, FNP while in the presence of Dawn Ada, FNP.  Subjective:  Patient ID: Dawn Rodriguez , female    DOB: [birth year] , 88 y.o.   MRN: [id number]  Chief Complaint  Patient presents with   Hypertension    HPI  Patient presents today for a bp and chol follow up, Patient reports compliance with medication. Patient denies any chest pain, SOB, or headaches. Patient has no concerns today.   AWV done today by [facility]      Past Medical History:  Diagnosis Date   Arthritis    Bilateral dry eyes    Cancer (HCC) 02/2018   left breast cancer   Cataracts, bilateral    Chronic headache    GERD (gastroesophageal reflux disease)    Hypertension    Personal history of radiation therapy    TMJ syndrome    Vaginal discharge 11/12/2018     Family History  Problem Relation Age of Onset   Pneumonia Mother      Current Outpatient Medications:    amLODipine  (NORVASC ) 5 MG tablet, TAKE 1 TABLET (5 MG TOTAL) BY MOUTH DAILY., Disp: 90 tablet, Rfl: 1   atorvastatin  (LIPITOR) 10 MG tablet, Take 1 tablet (10 mg total) by mouth daily., Disp: 90 tablet, Rfl: 1   Calcium  Carb-Cholecalciferol (CALCIUM -VITAMIN D3) 600-400 MG-UNIT TABS, Take by mouth 2 (two) times daily., Disp: , Rfl:    cycloSPORINE  (RESTASIS ) 0.05 % ophthalmic emulsion, Place 1 drop into both eyes 2 (two) times daily., Disp: 0.4 mL, Rfl:    Garlic 1000 MG CAPS, Take 1,000 mg by mouth daily. , Disp: , Rfl:    Glycerin-Polysorbate 80 (REFRESH DRY EYE THERAPY OP), Place 1-2 drops into both eyes 4 (four) times daily as needed (for dry eyes). , Disp: , Rfl:    hydrocortisone  (ANUSOL -HC) 2.5 % rectal cream, Place 1 Application rectally 2 (two) times daily., Disp: 30 g, Rfl: 1   KRILL OIL OMEGA-3 PO, Take 1 capsule by mouth daily., Disp: , Rfl:    Magnesium  250 MG TABS,  Take 1 tablet (250 mg total) by mouth daily., Disp: 90 tablet, Rfl: 1   olopatadine  (PATADAY ) 0.1 % ophthalmic solution, Place 1 drop into both eyes 2 (two) times daily., Disp: 5 mL, Rfl: 12   potassium gluconate 595 MG TABS, Take 595 mg by mouth daily., Disp: , Rfl:    vitamin B-12 (CYANOCOBALAMIN ) 1000 MCG tablet, Take 1,000 mcg by mouth daily., Disp: , Rfl:    Allergies  Allergen Reactions   Tobramycin Itching, Swelling and Rash    Tobramycin eye drops caused swelling, itching, drainage, rash and peeling of the skin     Review of Systems  Constitutional: Negative.   HENT: Negative.    Respiratory: Negative.    Cardiovascular: Negative.  Negative for chest pain and palpitations.  Gastrointestinal: Negative.   Neurological: Negative.  Negative for headaches.  Psychiatric/Behavioral: Negative.       Today's Vitals   08/30/23 1010  BP: 110/60  Pulse: 70  Temp: 97.7 F (36.5 C)  TempSrc: Oral  Weight: 162 lb (73.5 kg)  Height: 5' 4 (1.626 m)  PainSc: 0-No pain   Body mass index is 27.81 kg/m.  Wt Readings from Last 3 Encounters:  08/30/23 162 lb (73.5 kg)  08/30/23 162 lb (73.5 kg)  08/07/23 161 lb 8 oz (  73.3 kg)     Objective:  Physical Exam Vitals reviewed.  Constitutional:      General: She is not in acute distress.    Appearance: Normal appearance.  Cardiovascular:     Rate and Rhythm: Normal rate and regular rhythm.     Pulses: Normal pulses.     Heart sounds: Normal heart sounds. No murmur heard. Pulmonary:     Effort: Pulmonary effort is normal. No respiratory distress.     Breath sounds: Normal breath sounds. No wheezing.  Skin:    General: Skin is warm and dry.     Capillary Refill: Capillary refill takes less than 2 seconds.  Neurological:     General: No focal deficit present.     Mental Status: She is alert and oriented to person, place, and time.     Cranial Nerves: No cranial nerve deficit.     Motor: No weakness.  Psychiatric:        Mood  and Affect: Mood normal.        Behavior: Behavior normal.        Thought Content: Thought content normal.        Judgment: Judgment normal.       Assessment And Plan:  Essential hypertension Assessment & Plan: Blood pressure is well controlled, continue current medications.   Orders: -     Basic metabolic panel -     Microalbumin / creatinine urine ratio  Mixed hyperlipidemia Assessment & Plan: Cholesterol levels (total) elevated. Continue statin. Tolerating well. She did not stop for labs at last visit  Orders: -     Lipid panel  Abnormal glucose Assessment & Plan: HgbA1c improved at last visit. Continue focusing on diet low in sugar and starches.   Orders: -     Hemoglobin A1c  Atherosclerosis of aorta (HCC) Assessment & Plan: Continue statin   Primary osteoarthritis involving multiple joints Assessment & Plan: She is to f/u with Orthopedics   Tingling sensation Assessment & Plan: To bilateral hands mostly when trying to open jars. She is to follow up with her orthopedic. Will add a vitamin B12    History of breast cancer Assessment & Plan: She is no longer taking chemo drugs and is to f/u with Dr. Gudena as needed.    Herpes zoster vaccination declined Assessment & Plan: Declines shingrix , educated on disease process and is aware if he changes his mind to notify office    Influenza vaccination declined Assessment & Plan: Patient declined influenza vaccination at this time. Patient is aware that influenza vaccine prevents illness in 70% of healthy people, and reduces hospitalizations to 30-70% in elderly. This vaccine is recommended annually. Education has been provided regarding the importance of this vaccine but patient still declined. Advised may receive this vaccine at local pharmacy or Health Dept.or vaccine clinic. Aware to provide a copy of the vaccination record if obtained from local pharmacy or Health Dept.  Pt is willing to accept risk associated  with refusing vaccination.    COVID-19 vaccination declined Assessment & Plan: Declines covid 19 vaccine. Discussed risk of covid 82 and if she changes her mind about the vaccine to call the office. Education has been provided regarding the importance of this vaccine but patient still declined. Advised may receive this vaccine at local pharmacy or Health Dept.or vaccine clinic. Aware to provide a copy of the vaccination record if obtained from local pharmacy or Health Dept.  Encouraged to take multivitamin, vitamin d , vitamin c and  zinc to increase immune system. Aware can call office if would like to have vaccine here at office. Verbalized acceptance and understanding.    Tetanus, diphtheria, and acellular pertussis (Tdap) vaccination declined  Overweight with body mass index (BMI) of 27 to 27.9 in adult  Vitamin D  deficiency Assessment & Plan: Will check vitamin D  level and supplement as needed.    Also encouraged to spend 15 minutes in the sun daily.     Osteopenia of multiple sites Assessment & Plan: This has improved and is now in osteopenia range, continue with vitamin d  and calcium .      Return for keep same next..   Patient was given opportunity to ask questions. Patient verbalized understanding of the plan and was able to repeat key elements of the plan. All questions were answered to their satisfaction.    LILLETTE Dawn Ada, FNP, have reviewed all documentation for this visit. The documentation on 08/30/23 for the exam, diagnosis, procedures, and orders are all accurate and complete.   IF YOU HAVE BEEN REFERRED TO A SPECIALIST, IT MAY TAKE 1-2 WEEKS TO SCHEDULE/PROCESS THE REFERRAL. IF YOU HAVE NOT HEARD FROM US /SPECIALIST IN TWO WEEKS, PLEASE GIVE US  A CALL AT [phone].

## 2023-08-30 NOTE — Assessment & Plan Note (Signed)
 HgbA1c improved at last visit. Continue focusing on diet low in sugar and starches.

## 2023-08-30 NOTE — Assessment & Plan Note (Signed)
 Declines shingrix, educated on disease process and is aware if he changes his mind to notify office

## 2023-08-30 NOTE — Addendum Note (Signed)
 Addended by: Arnette Felts F on: 08/30/2023 12:44 PM   Modules accepted: Level of Service

## 2023-08-30 NOTE — Assessment & Plan Note (Signed)
 Blood pressure is well controlled, continue current medications.

## 2023-08-30 NOTE — Assessment & Plan Note (Signed)

## 2023-08-30 NOTE — Assessment & Plan Note (Signed)
 Will check vitamin D level and supplement as needed.    Also encouraged to spend 15 minutes in the sun daily.

## 2023-08-30 NOTE — Assessment & Plan Note (Signed)
 Cholesterol levels (total) elevated. Continue statin. Tolerating well. She did not stop for labs at last visit

## 2023-08-30 NOTE — Patient Instructions (Addendum)
 Ms. Counihan , Thank you for taking time to come for your Medicare Wellness Visit. I appreciate your ongoing commitment to your health goals. Please review the following plan we discussed and let me know if I can assist you in the future.   Referrals/Orders/Follow-Ups/Clinician Recommendations: none  This is a list of the screening recommended for you and due dates:  Health Maintenance  Topic Date Due   COVID-19 Vaccine (7 - 2024-25 season) 09/15/2023*   Flu Shot  11/20/2023*   Zoster (Shingles) Vaccine (1 of 2) 11/28/2023*   DTaP/Tdap/Td vaccine (2 - Td or Tdap) 08/29/2024*   Medicare Annual Wellness Visit  08/29/2024   Pneumonia Vaccine  Completed   DEXA scan (bone density measurement)  Completed   HPV Vaccine  Aged Out  *Topic was postponed. The date shown is not the original due date.    Advanced directives: (Declined) Advance directive discussed with you today. Even though you declined this today, please call our office should you change your mind, and we can give you the proper paperwork for you to fill out.  Next Medicare Annual Wellness Visit scheduled for next year: No, office will schedule  Insert Preventive Care attachment Insert FALL PREVENTION attachment if needed

## 2023-08-30 NOTE — Assessment & Plan Note (Signed)
 She is no longer taking chemo drugs and is to f/u with Dr. Pamelia Hoit as needed.

## 2023-08-30 NOTE — Assessment & Plan Note (Signed)
 She is to f/u with Orthopedics

## 2023-08-30 NOTE — Assessment & Plan Note (Signed)
 Continue statin.

## 2023-08-30 NOTE — Assessment & Plan Note (Signed)
 This has improved and is now in osteopenia range, continue with vitamin d and calcium.

## 2023-08-30 NOTE — Assessment & Plan Note (Signed)

## 2023-08-30 NOTE — Assessment & Plan Note (Signed)
 To bilateral hands mostly when trying to open jars. She is to follow up with her orthopedic. Will add a vitamin B12

## 2023-08-30 NOTE — Progress Notes (Signed)
 Subjective:   Dawn Rodriguez is a 88 y.o. female who presents for Medicare Annual (Subsequent) preventive examination.  Visit Complete: In person    Cardiac Risk Factors include: advanced age (>19men, >30 women)     Objective:    Today's Vitals   08/30/23 1019  BP: 110/60  Pulse: 70  Temp: 97.7 F (36.5 C)  TempSrc: Oral  Weight: 162 lb (73.5 kg)  Height: 5' 4 (1.626 m)   Body mass index is 27.81 kg/m.     08/30/2023   10:25 AM 07/27/2022    2:40 PM 06/24/2021    3:15 PM 07/15/2020   11:55 AM 06/19/2019    3:47 PM 07/16/2018    4:07 PM 05/23/2018    2:46 PM  Advanced Directives  Does Patient Have a Medical Advance Directive? No No No No No No No  Would patient like information on creating a medical advance directive?   No - Patient declined    Yes (MAU/Ambulatory/Procedural Areas - Information given)    Current Medications (verified) Outpatient Encounter Medications as of 08/30/2023  Medication Sig   amLODipine  (NORVASC ) 5 MG tablet TAKE 1 TABLET (5 MG TOTAL) BY MOUTH DAILY.   atorvastatin  (LIPITOR) 10 MG tablet Take 1 tablet (10 mg total) by mouth daily.   Calcium  Carb-Cholecalciferol (CALCIUM -VITAMIN D3) 600-400 MG-UNIT TABS Take by mouth 2 (two) times daily.   cycloSPORINE  (RESTASIS ) 0.05 % ophthalmic emulsion Place 1 drop into both eyes 2 (two) times daily.   Garlic 1000 MG CAPS Take 1,000 mg by mouth daily.    Glycerin-Polysorbate 80 (REFRESH DRY EYE THERAPY OP) Place 1-2 drops into both eyes 4 (four) times daily as needed (for dry eyes).    hydrocortisone  (ANUSOL -HC) 2.5 % rectal cream Place 1 Application rectally 2 (two) times daily.   KRILL OIL OMEGA-3 PO Take 1 capsule by mouth daily.   Magnesium  250 MG TABS Take 1 tablet (250 mg total) by mouth daily.   olopatadine  (PATADAY ) 0.1 % ophthalmic solution Place 1 drop into both eyes 2 (two) times daily.   potassium gluconate 595 MG TABS Take 595 mg by mouth daily.   vitamin B-12 (CYANOCOBALAMIN ) 1000 MCG  tablet Take 1,000 mcg by mouth daily.   No facility-administered encounter medications on file as of 08/30/2023.    Allergies (verified) Tobramycin   History: Past Medical History:  Diagnosis Date   Arthritis    Bilateral dry eyes    Cancer (HCC) 02/2018   left breast cancer   Cataracts, bilateral    Chronic headache    GERD (gastroesophageal reflux disease)    Hypertension    Personal history of radiation therapy    TMJ syndrome    Vaginal discharge 11/12/2018   Past Surgical History:  Procedure Laterality Date   BREAST BIOPSY Right 2012   stereotatic biopsy   BREAST BIOPSY Left 03/14/2018   BREAST BIOPSY Right 03/19/2020   BREAST LUMPECTOMY Left 04/06/2018   Procedure: LEFT BREAST LUMPECTOMY;  Surgeon: Mikell Katz, MD;  Location: [facility];  Service: General;  Laterality: Left;   CATARACT EXTRACTION     Family History  Problem Relation Age of Onset   Pneumonia Mother    Social History   Socioeconomic History   Marital status: Divorced    Spouse name: Not on file   Number of children: 2   Years of education: Not on file   Highest education level: Not on file  Occupational History   Occupation: retired  Tobacco  Use   Smoking status: Never   Smokeless tobacco: Never  Vaping Use   Vaping status: Never Used  Substance and Sexual Activity   Alcohol use: No   Drug use: No   Sexual activity: Not Currently    Birth control/protection: None  Other Topics Concern   Not on file  Social History Narrative   Not on file   Social Drivers of Health   Financial Resource Strain: Low Risk  (08/30/2023)   Overall Financial Resource Strain (CARDIA)    Difficulty of Paying Living Expenses: Not hard at all  Food Insecurity: No Food Insecurity (08/30/2023)   Hunger Vital Sign    Worried About Running Out of Food in the Last Year: Never true    Ran Out of Food in the Last Year: Never true  Transportation Needs: No Transportation Needs (08/30/2023)    PRAPARE - Administrator, Civil Service (Medical): No    Lack of Transportation (Non-Medical): No  Physical Activity: Insufficiently Active (08/30/2023)   Exercise Vital Sign    Days of Exercise per Week: 7 days    Minutes of Exercise per Session: 20 min  Stress: No Stress Concern Present (08/30/2023)   [employer] of Occupational Health - Occupational Stress Questionnaire    Feeling of Stress : Not at all  Social Connections: Moderately Isolated (08/30/2023)   Social Connection and Isolation Panel [NHANES]    Frequency of Communication with Friends and Family: More than three times a week    Frequency of Social Gatherings with Friends and Family: More than three times a week    Attends Religious Services: More than 4 times per year    Active Member of [employer] or Organizations: No    Attends Engineer, Structural: Never    Marital Status: Divorced    Tobacco Counseling Counseling given: Not Answered   Clinical Intake:  Pre-visit preparation completed: Yes  Pain : No/denies pain     Nutritional Status: BMI 25 -29 Overweight Nutritional Risks: None Diabetes: No  How often do you need to have someone help you when you read instructions, pamphlets, or other written materials from your doctor or pharmacy?: 1 - Never  Interpreter Needed?: No  Information entered by :: NAllen LPN   Activities of Daily Living    08/30/2023   10:20 AM  In your present state of health, do you have any difficulty performing the following activities:  Hearing? 0  Vision? 0  Difficulty concentrating or making decisions? 0  Walking or climbing stairs? 0  Dressing or bathing? 0  Doing errands, shopping? 0  Preparing Food and eating ? N  Using the Toilet? N  In the past six months, have you accidently leaked urine? Y  Comment with a sneeze or cough  Do you have problems with loss of bowel control? N  Managing your Medications? N  Managing your Finances? N  Housekeeping or  managing your Housekeeping? N    Patient Care Team: Georgina Speaks, FNP as PCP - General (General Practice) Mikell Katz, MD (Inactive) as Consulting Physician (General Surgery) Odean Potts, MD as Consulting Physician (Hematology and Oncology) Shannon Agent, MD as Consulting Physician (Radiation Oncology)  Indicate any recent Medical Services you may have received from other than [facility] providers in the past year (date may be approximate).     Assessment:   This is a routine wellness examination for Kensy.  Hearing/Vision screen Hearing Screening - Comments:: Denies hearing issues Vision Screening - Comments::  Regular eye exams, [facility]   Goals Addressed             This Visit's Progress    Patient Stated       08/30/2023, continue exercising       Depression Screen    08/30/2023   10:26 AM 02/06/2023    2:20 PM 11/04/2022   12:10 PM 07/27/2022    2:42 PM 07/04/2022    3:45 PM 06/24/2021    3:16 PM 07/15/2020   11:58 AM  PHQ 2/9 Scores  PHQ - 2 Score 0 0 0 0 0 0 0  PHQ- 9 Score  0         Fall Risk    08/30/2023   10:26 AM 02/06/2023    2:20 PM 11/04/2022   12:10 PM 07/27/2022    2:41 PM 07/04/2022    3:45 PM  Fall Risk   Falls in the past year? 0 0 0 0 0  Number falls in past yr: 0 0 0 0 0  Injury with Fall? 0 0 0 0 0  Risk for fall due to : Medication side effect No Fall Risks No Fall Risks No Fall Risks No Fall Risks  Follow up Falls prevention discussed;Falls evaluation completed Falls evaluation completed Falls evaluation completed Falls prevention discussed Falls evaluation completed    MEDICARE RISK AT HOME: Medicare Risk at Home Any stairs in or around the home?: Yes If so, are there any without handrails?: No Home free of loose throw rugs in walkways, pet beds, electrical cords, etc?: Yes Adequate lighting in your home to reduce risk of falls?: Yes Life alert?: No Use of a cane, walker or w/c?: No Grab bars in the bathroom?: No Shower  chair or bench in shower?: No Elevated toilet seat or a handicapped toilet?: No  TIMED UP AND GO:  Was the test performed?  Yes  Length of time to ambulate 10 feet: 5 sec Gait steady and fast without use of assistive device    Cognitive Function:        08/30/2023   10:26 AM 07/27/2022    2:45 PM 06/24/2021    3:17 PM 07/15/2020   12:01 PM 06/19/2019    3:53 PM  6CIT Screen  What Year? 0 points 0 points 0 points 0 points 0 points  What month? 0 points 0 points 0 points 0 points 0 points  What time? 0 points 0 points 0 points 0 points 0 points  Count back from 20 0 points 0 points 0 points 0 points 0 points  Months in reverse 0 points 0 points 0 points 0 points 2 points  Repeat phrase 0 points 2 points 6 points 2 points 6 points  Total Score 0 points 2 points 6 points 2 points 8 points    Immunizations Immunization History  Administered Date(s) Administered   Moderna Covid-19 Fall Seasonal Vaccine 38yrs & older 06/08/2023   Moderna Covid-19 Vaccine Bivalent Booster 84yrs & up 07/13/2021   Moderna Sars-Covid-2 Vaccination 11/08/2019, 12/09/2019   PFIZER(Purple Top)SARS-COV-2 Vaccination 07/15/2020   Pneumococcal Conjugate-13 06/19/2019, 06/23/2019   Pneumococcal Polysaccharide-23 01/12/2021   Tdap 05/17/2013   Unspecified SARS-COV-2 Vaccination 07/23/2022    TDAP status: Due, Education has been provided regarding the importance of this vaccine. Advised may receive this vaccine at local pharmacy or Health Dept. Aware to provide a copy of the vaccination record if obtained from local pharmacy or Health Dept. Verbalized acceptance and understanding.  Flu Vaccine status:  Declined, Education has been provided regarding the importance of this vaccine but patient still declined. Advised may receive this vaccine at local pharmacy or Health Dept. Aware to provide a copy of the vaccination record if obtained from local pharmacy or Health Dept. Verbalized acceptance and  understanding.  Pneumococcal vaccine status: Up to date  Covid-19 vaccine status: Completed vaccines  Qualifies for Shingles Vaccine? Yes   Zostavax completed No   Shingrix  Completed?: No.    Education has been provided regarding the importance of this vaccine. Patient has been advised to call insurance company to determine out of pocket expense if they have not yet received this vaccine. Advised may also receive vaccine at local pharmacy or Health Dept. Verbalized acceptance and understanding.  Screening Tests Health Maintenance  Topic Date Due   COVID-19 Vaccine (7 - 2024-25 season) 09/15/2023 (Originally 08/03/2023)   INFLUENZA VACCINE  11/20/2023 (Originally 03/23/2023)   Zoster Vaccines- Shingrix  (1 of 2) 11/28/2023 (Originally 09/02/1954)   DTaP/Tdap/Td (2 - Td or Tdap) 08/29/2024 (Originally 05/18/2023)   Medicare Annual Wellness (AWV)  08/29/2024   Pneumonia Vaccine 25+ Years old  Completed   DEXA SCAN  Completed   HPV VACCINES  Aged Out    Health Maintenance  There are no preventive care reminders to display for this patient.   Colorectal cancer screening: No longer required.   Mammogram status: Completed 05/10/2023. Repeat every year  Bone Density status: Completed 04/27/2023.   Lung Cancer Screening: (Low Dose CT Chest recommended if Age 72-80 years, 20 pack-year currently smoking OR have quit w/in 15years.) does not qualify.   Lung Cancer Screening Referral: no  Additional Screening:  Hepatitis C Screening: does not qualify;   Vision Screening: Recommended annual ophthalmology exams for early detection of glaucoma and other disorders of the eye. Is the patient up to date with their annual eye exam?  Yes  Who is the provider or what is the name of the office in which the patient attends annual eye exams? [facility] If pt is not established with a provider, would they like to be referred to a provider to establish care? No .   Dental Screening: Recommended annual  dental exams for proper oral hygiene  Diabetic Foot Exam: n/a  Community Resource Referral / Chronic Care Management: CRR required this visit?  No   CCM required this visit?  No     Plan:     I have personally reviewed and noted the following in the patient's chart:   Medical and social history Use of alcohol, tobacco or illicit drugs  Current medications and supplements including opioid prescriptions. Patient is not currently taking opioid prescriptions. Functional ability and status Nutritional status Physical activity Advanced directives List of other physicians Hospitalizations, surgeries, and ER visits in previous 12 months Vitals Screenings to include cognitive, depression, and falls Referrals and appointments  In addition, I have reviewed and discussed with patient certain preventive protocols, quality metrics, and best practice recommendations. A written personalized care plan for preventive services as well as general preventive health recommendations were provided to patient.     Ardella FORBES Dawn, LPN   03/22/7973   After Visit Summary: (In Person-Printed) AVS printed and given to the patient  Nurse Notes: none

## 2023-09-01 LAB — MICROALBUMIN / CREATININE URINE RATIO
Creatinine, Urine: 46.1 mg/dL
Microalb/Creat Ratio: 16 mg/g{creat} (ref 0–29)
Microalbumin, Urine: 7.3 ug/mL

## 2023-09-01 LAB — BASIC METABOLIC PANEL
BUN/Creatinine Ratio: 15 (ref 12–28)
BUN: 12 mg/dL (ref 8–27)
CO2: 26 mmol/L (ref 20–29)
Calcium: 9.4 mg/dL (ref 8.7–10.3)
Chloride: 105 mmol/L (ref 96–106)
Creatinine, Ser: 0.82 mg/dL (ref 0.57–1.00)
Glucose: 87 mg/dL (ref 70–99)
Potassium: 4 mmol/L (ref 3.5–5.2)
Sodium: 145 mmol/L — ABNORMAL HIGH (ref 134–144)
eGFR: 70 mL/min/{1.73_m2} (ref >59)

## 2023-09-01 LAB — LIPID PANEL
Chol/HDL Ratio: 3.4 {ratio} (ref 0.0–4.4)
Cholesterol, Total: 208 mg/dL — ABNORMAL HIGH (ref 100–199)
HDL: 62 mg/dL (ref >39)
LDL Chol Calc (NIH): 138 mg/dL — ABNORMAL HIGH (ref 0–99)
Triglycerides: 44 mg/dL (ref 0–149)
VLDL Cholesterol Cal: 8 mg/dL (ref 5–40)

## 2023-09-01 LAB — HEMOGLOBIN A1C
Est. average glucose Bld gHb Est-mCnc: 120 mg/dL
Hgb A1c MFr Bld: 5.8 % — ABNORMAL HIGH (ref 4.8–5.6)

## 2023-09-17 ENCOUNTER — Ambulatory Visit (HOSPITAL_COMMUNITY)
Admission: EM | Admit: 2023-09-17 | Discharge: 2023-09-17 | Disposition: A | Discharge status: Home / Self Care | Payer: Medicare HMO | Attending: Physician Assistant | Admitting: Physician Assistant

## 2023-09-17 ENCOUNTER — Encounter (HOSPITAL_COMMUNITY): Payer: Self-pay | Admitting: Emergency Medicine

## 2023-09-17 ENCOUNTER — Other Ambulatory Visit: Payer: Self-pay

## 2023-09-17 DIAGNOSIS — K219 Gastro-esophageal reflux disease without esophagitis: Secondary | ICD-10-CM

## 2023-09-17 MED ORDER — OMEPRAZOLE 20 MG PO CPDR: 20.0000 mg | DELAYED_RELEASE_CAPSULE | Freq: Every day | ORAL | 0 refills | Status: DC

## 2023-09-17 NOTE — ED Triage Notes (Signed)
Ran out of omeprazole several months ago.  Patient has a history of  acid reflux.  Takes pepto bismal, and has been watching her diet.  Denies nausea or vomiting.  Usually drinks ginger ale or coke and belches and heart burn goes away.this time is different.

## 2023-09-17 NOTE — Discharge Instructions (Addendum)
Resume Omeprazole Drink plenty of water If no improvement follow up with your primary care physician

## 2023-09-17 NOTE — ED Provider Notes (Signed)
[facility]    CSN: [id number] Arrival date & time: 09/17/23  1010      History   Chief Complaint Chief Complaint  Patient presents with   Gastroesophageal Reflux    HPI Dawn Rodriguez is a 88 y.o. female.   Patient presents with several days of acid reflux and chest discomfort.  She reports he has a history of GERD but has been out of her omeprazole several months now and on Friday night had a heavy meal of cheeseburger.  She reports feeling like she needs to burp.  She denies radiation of chest pain, shortness of breath, wheezing, lower extremity swelling.  She has tried Pepto-Bismol which she reports provided some temporary relief.  Denies nausea, vomiting.  Reports normal bowel movements with last bowel movement this morning.  Denies abdominal pain.    Past Medical History:  Diagnosis Date   Arthritis    Bilateral dry eyes    Cancer (HCC) 02/2018   left breast cancer   Cataracts, bilateral    Chronic headache    GERD (gastroesophageal reflux disease)    Hypertension    Personal history of radiation therapy    TMJ syndrome    Vaginal discharge 11/12/2018    Patient Active Problem List   Diagnosis Date Noted   Influenza vaccination declined 08/30/2023   COVID-19 vaccination declined 08/30/2023   Atherosclerosis of aorta (HCC) 08/30/2023   Tingling sensation 08/30/2023   History of breast cancer 08/30/2023   Osteopenia of multiple sites 08/30/2023   Bumps on skin 05/10/2023   Dysuria 05/10/2023   Rectal pain 05/10/2023   Leukocytes in urine 05/10/2023   Herpes zoster vaccination declined 02/06/2023   Vitamin D deficiency 02/06/2023   Decreased estrogen level 02/06/2023   Anxiety 11/04/2022   Abnormal glucose 11/04/2022   Mixed hyperlipidemia 11/04/2022   Essential hypertension 11/04/2022   Encounter for Papanicolaou smear of cervix 11/12/2018   Malignant neoplasm of upper-inner quadrant of left breast in female, estrogen receptor positive  (HCC) 03/20/2018   Cataracts, bilateral    GERD (gastroesophageal reflux disease)    Osteoarthritis 10/04/2007   Internal hemorrhoids 09/14/2007   Diverticulosis of colon 09/14/2007    Past Surgical History:  Procedure Laterality Date   BREAST BIOPSY Right 2012   stereotatic biopsy   BREAST BIOPSY Left 03/14/2018   BREAST BIOPSY Right 03/19/2020   BREAST LUMPECTOMY Left 04/06/2018   Procedure: LEFT BREAST LUMPECTOMY;  Surgeon: Glenna Fellows, MD;  Location: [facility];  Service: General;  Laterality: Left;   CATARACT EXTRACTION      OB History   No obstetric history on file.      Home Medications    Prior to Admission medications   Medication Sig Start Date End Date Taking? Authorizing Provider  omeprazole (PRILOSEC) 20 MG capsule Take 1 capsule (20 mg total) by mouth daily. 09/17/23  Yes Ward, Tylene Fantasia, PA-C  amLODipine (NORVASC) 5 MG tablet TAKE 1 TABLET (5 MG TOTAL) BY MOUTH DAILY. 06/05/23   Arnette Felts, FNP  atorvastatin (LIPITOR) 10 MG tablet Take 1 tablet (10 mg total) by mouth daily. Patient not taking: Reported on 09/17/2023 11/04/22   Arnette Felts, FNP  Calcium Carb-Cholecalciferol (CALCIUM-VITAMIN D3) 600-400 MG-UNIT TABS Take by mouth 2 (two) times daily.    [provider]  cycloSPORINE (RESTASIS) 0.05 % ophthalmic emulsion Place 1 drop into both eyes 2 (two) times daily. 12/31/20   Serena Croissant, MD  Garlic 1000 MG CAPS Take 1,000 mg  by mouth daily.     [provider]  Glycerin-Polysorbate 80 (REFRESH DRY EYE THERAPY OP) Place 1-2 drops into both eyes 4 (four) times daily as needed (for dry eyes).     [provider]  hydrocortisone (ANUSOL-HC) 2.5 % rectal cream Place 1 Application rectally 2 (two) times daily. 04/27/23   Ellender Hose, NP  KRILL OIL OMEGA-3 PO Take 1 capsule by mouth daily.    [provider]  Magnesium 250 MG TABS Take 1 tablet (250 mg total) by mouth daily. 11/04/22   Arnette Felts, FNP   olopatadine (PATADAY) 0.1 % ophthalmic solution Place 1 drop into both eyes 2 (two) times daily. 12/31/20   Serena Croissant, MD  potassium gluconate 595 MG TABS Take 595 mg by mouth daily.    [provider]  vitamin B-12 (CYANOCOBALAMIN) 1000 MCG tablet Take 1,000 mcg by mouth daily.    [provider]    Family History Family History  Problem Relation Age of Onset   Pneumonia Mother     Social History Social History   Tobacco Use   Smoking status: Never   Smokeless tobacco: Never  Vaping Use   Vaping status: Never Used  Substance Use Topics   Alcohol use: No   Drug use: No     Allergies   Tobramycin   Review of Systems Review of Systems  Constitutional:  Negative for chills and fever.  HENT:  Negative for ear pain and sore throat.   Eyes:  Negative for pain and visual disturbance.  Respiratory:  Negative for cough and shortness of breath.   Cardiovascular:  Negative for chest pain and palpitations.  Gastrointestinal:  Negative for abdominal pain and vomiting.  Genitourinary:  Negative for dysuria and hematuria.  Musculoskeletal:  Negative for arthralgias and back pain.  Skin:  Negative for color change and rash.  Neurological:  Negative for seizures and syncope.  All other systems reviewed and are negative.    Physical Exam Triage Vital Signs ED Triage Vitals  Encounter Vitals Group     BP 09/17/23 1100 (!) 150/69     Systolic BP Percentile --      Diastolic BP Percentile --      Pulse Rate 09/17/23 1100 75     Resp 09/17/23 1100 18     Temp 09/17/23 1100 98 F (36.7 C)     Temp Source 09/17/23 1100 Oral     SpO2 09/17/23 1100 96 %     Weight --      Height --      Head Circumference --      Peak Flow --      Pain Score 09/17/23 1057 5     Pain Loc --      Pain Education --      Exclude from Growth Chart --    No data found.  Updated Vital Signs BP (!) 153/70 (BP Location: Left Arm) Comment: patient is borderline between regular  cuff and large cuff Comment (BP Location): large cuff  Pulse 75   Temp 98 F (36.7 C) (Oral)   Resp 18   SpO2 96%   Visual Acuity Right Eye Distance:   Left Eye Distance:   Bilateral Distance:    Right Eye Near:   Left Eye Near:    Bilateral Near:     Physical Exam Vitals and nursing note reviewed.  Constitutional:      General: She is not in acute distress.  Appearance: She is well-developed.  HENT:     Head: Normocephalic and atraumatic.  Eyes:     Conjunctiva/sclera: Conjunctivae normal.  Cardiovascular:     Rate and Rhythm: Normal rate and regular rhythm.     Heart sounds: No murmur heard. Pulmonary:     Effort: Pulmonary effort is normal. No respiratory distress.     Breath sounds: Normal breath sounds.  Abdominal:     Palpations: Abdomen is soft.     Tenderness: There is no abdominal tenderness.  Musculoskeletal:        General: No swelling.     Cervical back: Neck supple.  Skin:    General: Skin is warm and dry.     Capillary Refill: Capillary refill takes less than 2 seconds.  Neurological:     Mental Status: She is alert.  Psychiatric:        Mood and Affect: Mood normal.      UC Treatments / Results  Labs (all labs ordered are listed, but only abnormal results are displayed) Labs Reviewed - No data to display  EKG   Radiology No results found.  Procedures Procedures (including critical care time)  Medications Ordered in UC Medications - No data to display  Initial Impression / Assessment and Plan / UC Course  I have reviewed the triage vital signs and the nursing notes.  Pertinent labs & imaging results that were available during my care of the patient were reviewed by me and considered in my medical decision making (see chart for details).     Patient without epigastric pain just reports increased acid reflux and belching.  EKG normal in clinic today.  Reports Pepto-Bismol has provided some relief.  Will restart omeprazole.   Supportive care discussed.  Advised to follow-up with PCP if no improvement.  ED precautions given. Final Clinical Impressions(s) / UC Diagnoses   Final diagnoses:  Gastroesophageal reflux disease without esophagitis     Discharge Instructions      Resume Omeprazole Drink plenty of water If no improvement follow up with your primary care physician     ED Prescriptions     Medication Sig Dispense Auth. Provider   omeprazole (PRILOSEC) 20 MG capsule Take 1 capsule (20 mg total) by mouth daily. 30 capsule Ward, Tylene Fantasia, PA-C      PDMP not reviewed this encounter.   Ward, Tylene Fantasia, PA-C 09/17/23 1248

## 2023-11-01 ENCOUNTER — Other Ambulatory Visit: Payer: Medicare PPO

## 2023-11-06 ENCOUNTER — Encounter: Payer: Self-pay | Admitting: Nurse Practitioner

## 2023-11-06 ENCOUNTER — Ambulatory Visit: Payer: Self-pay | Admitting: Nurse Practitioner

## 2023-11-06 VITALS — BP 100/80 | HR 75 | Temp 99.0°F | Ht 64.0 in | Wt 159.0 lb

## 2023-11-06 DIAGNOSIS — R319 Hematuria, unspecified: Secondary | ICD-10-CM | POA: Diagnosis not present

## 2023-11-06 DIAGNOSIS — E782 Mixed hyperlipidemia: Secondary | ICD-10-CM | POA: Diagnosis not present

## 2023-11-06 DIAGNOSIS — I10 Essential (primary) hypertension: Secondary | ICD-10-CM | POA: Diagnosis not present

## 2023-11-06 DIAGNOSIS — Z853 Personal history of malignant neoplasm of breast: Secondary | ICD-10-CM

## 2023-11-06 DIAGNOSIS — R7309 Other abnormal glucose: Secondary | ICD-10-CM | POA: Diagnosis not present

## 2023-11-06 DIAGNOSIS — Z0000 Encounter for general adult medical examination without abnormal findings: Secondary | ICD-10-CM | POA: Diagnosis not present

## 2023-11-06 DIAGNOSIS — Z79899 Other long term (current) drug therapy: Secondary | ICD-10-CM | POA: Diagnosis not present

## 2023-11-06 DIAGNOSIS — Z23 Encounter for immunization: Secondary | ICD-10-CM | POA: Diagnosis not present

## 2023-11-06 DIAGNOSIS — E559 Vitamin D deficiency, unspecified: Secondary | ICD-10-CM | POA: Diagnosis not present

## 2023-11-06 LAB — POCT URINALYSIS DIP (CLINITEK)
Bilirubin, UA: NEGATIVE
Glucose, UA: NEGATIVE mg/dL
Ketones, POC UA: NEGATIVE mg/dL
Nitrite, UA: NEGATIVE
POC PROTEIN,UA: NEGATIVE
Spec Grav, UA: 1.015 (ref 1.010–1.025)
Urobilinogen, UA: 1 U/dL
pH, UA: 6.5 (ref 5.0–8.0)

## 2023-11-06 NOTE — Assessment & Plan Note (Signed)
 Shingrix #1 given with [employer] signed

## 2023-11-06 NOTE — Progress Notes (Signed)
 Madelaine Bhat, CMA,acting as a Neurosurgeon for Arnette Felts, FNP.,have documented all relevant documentation on the behalf of Arnette Felts, FNP,as directed by  Arnette Felts, FNP while in the presence of Arnette Felts, FNP.  Subjective:    Patient ID: Dawn Rodriguez , female    DOB: 12-30-1935 , 88 y.o.   MRN: [id number]  Chief Complaint  Patient presents with   Annual Exam    HPI  Patient presents today for HM, Patient reports compliance with medication. Patient denies any chest pain, SOB, or headaches. Patient has no concerns today. Patient previously had EKG. She is working on losing weight by cutting back on her sweets. She reports she had the flu 3 weeks ago and will get her flu vaccine next season.  Hypertension This is a chronic problem. The current episode started more than 1 year ago. The problem is unchanged. Pertinent negatives include no chest pain, headaches or palpitations. Risk factors for coronary artery disease include dyslipidemia. Past treatments include calcium channel blockers. There are no compliance problems.  There is no history of angina.  Hyperlipidemia Pertinent negatives include no chest pain.     Past Medical History:  Diagnosis Date   Arthritis    Bilateral dry eyes    Cancer (HCC) 02/2018   left breast cancer   Cataracts, bilateral    Chronic headache    GERD (gastroesophageal reflux disease)    Hypertension    Personal history of radiation therapy    TMJ syndrome    Vaginal discharge 11/12/2018     Family History  Problem Relation Age of Onset   Pneumonia Mother      Current Outpatient Medications:    amLODipine (NORVASC) 5 MG tablet, TAKE 1 TABLET (5 MG TOTAL) BY MOUTH DAILY., Disp: 90 tablet, Rfl: 1   Calcium Carb-Cholecalciferol (CALCIUM-VITAMIN D3) 600-400 MG-UNIT TABS, Take by mouth 2 (two) times daily., Disp: , Rfl:    cycloSPORINE (RESTASIS) 0.05 % ophthalmic emulsion, Place 1 drop into both eyes 2 (two) times daily., Disp: 0.4 mL,  Rfl:    Garlic 1000 MG CAPS, Take 1,000 mg by mouth daily. , Disp: , Rfl:    Glycerin-Polysorbate 80 (REFRESH DRY EYE THERAPY OP), Place 1-2 drops into both eyes 4 (four) times daily as needed (for dry eyes). , Disp: , Rfl:    hydrocortisone (ANUSOL-HC) 2.5 % rectal cream, Place 1 Application rectally 2 (two) times daily., Disp: 30 g, Rfl: 1   KRILL OIL OMEGA-3 PO, Take 1 capsule by mouth daily., Disp: , Rfl:    Magnesium 250 MG TABS, Take 1 tablet (250 mg total) by mouth daily., Disp: 90 tablet, Rfl: 1   olopatadine (PATADAY) 0.1 % ophthalmic solution, Place 1 drop into both eyes 2 (two) times daily., Disp: 5 mL, Rfl: 12   omeprazole (PRILOSEC) 20 MG capsule, Take 1 capsule (20 mg total) by mouth daily., Disp: 30 capsule, Rfl: 0   potassium gluconate 595 MG TABS, Take 595 mg by mouth daily., Disp: , Rfl:    vitamin B-12 (CYANOCOBALAMIN) 1000 MCG tablet, Take 1,000 mcg by mouth daily., Disp: , Rfl:    atorvastatin (LIPITOR) 10 MG tablet, Take 1 tablet (10 mg total) by mouth daily. (Patient not taking: Reported on 11/06/2023), Disp: 90 tablet, Rfl: 1   Allergies  Allergen Reactions   Tobramycin Itching, Swelling and Rash    Tobramycin eye drops caused swelling, itching, drainage, rash and peeling of the skin      The patient states  she is post menopausal status.  Negative for: breast discharge, breast lump(s), breast pain and breast self exam. Associated symptoms include abnormal vaginal bleeding. Pertinent negatives include abnormal bleeding (hematology), anxiety, decreased libido, depression, difficulty falling sleep, dyspareunia, history of infertility, nocturia, sexual dysfunction, sleep disturbances, urinary incontinence, urinary urgency, vaginal discharge and vaginal itching. Diet regular. The patient states her exercise level is moderate exercises daily.   The patient's tobacco use is:  Social History   Tobacco Use  Smoking Status Never  Smokeless Tobacco Never   She has been exposed  to passive smoke. The patient's alcohol use is:  Social History   Substance and Sexual Activity  Alcohol Use No   Review of Systems  Constitutional: Negative.   HENT: Negative.    Eyes: Negative.   Respiratory: Negative.    Cardiovascular: Negative.  Negative for chest pain and palpitations.  Gastrointestinal: Negative.   Endocrine: Negative.   Genitourinary: Negative.   Musculoskeletal: Negative.   Skin: Negative.   Allergic/Immunologic: Negative.   Neurological: Negative.  Negative for headaches.  Hematological: Negative.   Psychiatric/Behavioral: Negative.       Today's Vitals   11/06/23 1453  BP: 100/80  Pulse: 75  Temp: 99 F (37.2 C)  TempSrc: Oral  Weight: 159 lb (72.1 kg)  Height: 5\' 4"  (1.626 m)  PainSc: 0-No pain   Body mass index is 27.29 kg/m.  Wt Readings from Last 3 Encounters:  11/06/23 159 lb (72.1 kg)  08/30/23 162 lb (73.5 kg)  08/30/23 162 lb (73.5 kg)     Objective:  Physical Exam Vitals reviewed.  Constitutional:      General: She is not in acute distress.    Appearance: Normal appearance. She is well-developed.  HENT:     Head: Normocephalic and atraumatic.     Right Ear: Hearing, tympanic membrane, ear canal and external ear normal. There is no impacted cerumen.     Left Ear: Hearing, tympanic membrane, ear canal and external ear normal. There is no impacted cerumen.     Nose: Nose normal.     Mouth/Throat:     Mouth: Mucous membranes are moist.     Dentition: Has dentures.  Eyes:     General: Lids are normal.     Extraocular Movements: Extraocular movements intact.     Conjunctiva/sclera: Conjunctivae normal.     Pupils: Pupils are equal, round, and reactive to light.     Funduscopic exam:    Right eye: No papilledema.        Left eye: No papilledema.  Neck:     Thyroid: No thyroid mass.     Vascular: No carotid bruit.  Cardiovascular:     Rate and Rhythm: Normal rate and regular rhythm.     Pulses: Normal pulses.     Heart  sounds: Normal heart sounds. No murmur heard. Pulmonary:     Effort: Pulmonary effort is normal. No respiratory distress.     Breath sounds: Normal breath sounds. No wheezing.  Abdominal:     General: Abdomen is flat. Bowel sounds are normal. There is no distension.     Palpations: Abdomen is soft.     Tenderness: There is no abdominal tenderness.  Musculoskeletal:        General: No swelling or tenderness. Normal range of motion.     Cervical back: Full passive range of motion without pain, normal range of motion and neck supple.     Right lower leg: No edema.  Left lower leg: No edema.  Skin:    General: Skin is warm and dry.     Capillary Refill: Capillary refill takes less than 2 seconds.  Neurological:     General: No focal deficit present.     Mental Status: She is alert and oriented to person, place, and time.     Cranial Nerves: No cranial nerve deficit.     Sensory: No sensory deficit.     Motor: No weakness.  Psychiatric:        Mood and Affect: Mood normal.        Behavior: Behavior normal.        Thought Content: Thought content normal.        Judgment: Judgment normal.     Assessment And Plan:     Encounter for annual health examination Assessment & Plan: Behavior modifications discussed and diet history reviewed.   Pt will continue to exercise regularly and modify diet with low GI, plant based foods and decrease intake of processed foods.  Recommend intake of daily multivitamin, Vitamin D, and calcium.  Recommend mammogram for preventive screenings, as well as recommend immunizations that include influenza, TDAP, and Shingles (#1 given today)    Essential hypertension Assessment & Plan: Blood pressure is well controlled, continue current medications.   Orders: -     POCT URINALYSIS DIP (CLINITEK) -     Microalbumin / creatinine urine ratio -     CMP14+EGFR  Mixed hyperlipidemia Assessment & Plan: Cholesterol levels (total) elevated. Continue statin.  Tolerating well.   Orders: -     Lipid panel  Abnormal glucose Assessment & Plan: HgbA1c improved at last visit. Continue focusing on diet low in sugar and starches.   Orders: -     Hemoglobin A1c  Vitamin D deficiency Assessment & Plan: Will check vitamin D level and supplement as needed.    Also encouraged to spend 15 minutes in the sun daily.    Orders: -     VITAMIN D 25 Hydroxy (Vit-D Deficiency, Fractures)  Hematuria, unspecified type Assessment & Plan: Moderate blood will send for urine culture  Orders: -     Urine Culture  Need for zoster vaccination Assessment & Plan: Shingrix #1 given with TransRx signed  Orders: -     Varicella-zoster vaccine IM  History of breast cancer Assessment & Plan: Per note by Dr Pamelia Hoit in Dec 2024 she is to f/u on an as needed basis. She is no longer taking her chemo medications.    Other long term (current) drug therapy -     CBC with Differential/Platelet   Return for 1 year physical, 6 month bp check. Patient was given opportunity to ask questions. Patient verbalized understanding of the plan and was able to repeat key elements of the plan. All questions were answered to their satisfaction.   Arnette Felts, FNP  I, Arnette Felts, FNP, have reviewed all documentation for this visit. The documentation on 11/06/23 for the exam, diagnosis, procedures, and orders are all accurate and complete.

## 2023-11-06 NOTE — Assessment & Plan Note (Signed)
 Behavior modifications discussed and diet history reviewed.   Pt will continue to exercise regularly and modify diet with low GI, plant based foods and decrease intake of processed foods.  Recommend intake of daily multivitamin, Vitamin D, and calcium.  Recommend mammogram for preventive screenings, as well as recommend immunizations that include influenza, TDAP, and Shingles (#1 given today)

## 2023-11-06 NOTE — Assessment & Plan Note (Signed)
 Will check vitamin D level and supplement as needed.    Also encouraged to spend 15 minutes in the sun daily.

## 2023-11-06 NOTE — Assessment & Plan Note (Signed)
 Blood pressure is well controlled, continue current medications.

## 2023-11-06 NOTE — Assessment & Plan Note (Signed)
 Cholesterol levels (total) elevated. Continue statin. Tolerating well.

## 2023-11-06 NOTE — Assessment & Plan Note (Signed)
 HgbA1c improved at last visit. Continue focusing on diet low in sugar and starches.

## 2023-11-06 NOTE — Assessment & Plan Note (Signed)
 Per note by Dr Pamelia Hoit in Dec 2024 she is to f/u on an as needed basis. She is no longer taking her chemo medications.

## 2023-11-07 LAB — CBC WITH DIFFERENTIAL/PLATELET
Basophils Absolute: 0 10*3/uL (ref 0.0–0.2)
Basos: 1 %
EOS (ABSOLUTE): 0.1 10*3/uL (ref 0.0–0.4)
Eos: 2 %
Hematocrit: 37.3 % (ref 34.0–46.6)
Hemoglobin: 12.1 g/dL (ref 11.1–15.9)
Immature Grans (Abs): 0 10*3/uL (ref 0.0–0.1)
Immature Granulocytes: 0 %
Lymphocytes Absolute: 1.3 10*3/uL (ref 0.7–3.1)
Lymphs: 37 %
MCH: 28.3 pg (ref 26.6–33.0)
MCHC: 32.4 g/dL (ref 31.5–35.7)
MCV: 87 fL (ref 79–97)
Monocytes Absolute: 0.4 10*3/uL (ref 0.1–0.9)
Monocytes: 11 %
Neutrophils Absolute: 1.7 10*3/uL (ref 1.4–7.0)
Neutrophils: 49 %
Platelets: 248 10*3/uL (ref 150–450)
RBC: 4.28 x10E6/uL (ref 3.77–5.28)
RDW: 14.1 % (ref 11.7–15.4)
WBC: 3.6 10*3/uL (ref 3.4–10.8)

## 2023-11-07 LAB — URINE CULTURE

## 2023-11-07 LAB — CMP14+EGFR
ALT: 13 IU/L (ref 0–32)
AST: 19 IU/L (ref 0–40)
Albumin: 4.3 g/dL (ref 3.7–4.7)
Alkaline Phosphatase: 81 IU/L (ref 44–121)
BUN/Creatinine Ratio: 11 — ABNORMAL LOW (ref 12–28)
BUN: 9 mg/dL (ref 8–27)
Bilirubin Total: 0.6 mg/dL (ref 0.0–1.2)
CO2: 26 mmol/L (ref 20–29)
Calcium: 9.3 mg/dL (ref 8.7–10.3)
Chloride: 102 mmol/L (ref 96–106)
Creatinine, Ser: 0.82 mg/dL (ref 0.57–1.00)
Globulin, Total: 2.6 g/dL (ref 1.5–4.5)
Glucose: 77 mg/dL (ref 70–99)
Potassium: 3.8 mmol/L (ref 3.5–5.2)
Sodium: 141 mmol/L (ref 134–144)
Total Protein: 6.9 g/dL (ref 6.0–8.5)
eGFR: 70 mL/min/{1.73_m2} (ref >59)

## 2023-11-07 LAB — VITAMIN D 25 HYDROXY (VIT D DEFICIENCY, FRACTURES): Vit D, 25-Hydroxy: 55.9 ng/mL (ref 30.0–100.0)

## 2023-11-07 LAB — LIPID PANEL
Chol/HDL Ratio: 3.7 ratio (ref 0.0–4.4)
Cholesterol, Total: 212 mg/dL — ABNORMAL HIGH (ref 100–199)
HDL: 58 mg/dL (ref >39)
LDL Chol Calc (NIH): 145 mg/dL — ABNORMAL HIGH (ref 0–99)
Triglycerides: 50 mg/dL (ref 0–149)
VLDL Cholesterol Cal: 9 mg/dL (ref 5–40)

## 2023-11-07 LAB — HEMOGLOBIN A1C
Est. average glucose Bld gHb Est-mCnc: 120 mg/dL
Hgb A1c MFr Bld: 5.8 % — ABNORMAL HIGH (ref 4.8–5.6)

## 2023-11-07 LAB — MICROALBUMIN / CREATININE URINE RATIO
Creatinine, Urine: 112.6 mg/dL
Microalb/Creat Ratio: 8 mg/g{creat} (ref 0–29)
Microalbumin, Urine: 9.5 ug/mL

## 2023-11-19 DIAGNOSIS — R319 Hematuria, unspecified: Secondary | ICD-10-CM | POA: Insufficient documentation

## 2023-11-19 MED ORDER — ATORVASTATIN CALCIUM 10 MG PO TABS: 10.0000 mg | ORAL_TABLET | Freq: Every day | ORAL | 1 refills | Status: DC

## 2023-11-19 NOTE — Assessment & Plan Note (Signed)
 Moderate blood will send for urine culture

## 2023-11-20 ENCOUNTER — Other Ambulatory Visit: Payer: Self-pay

## 2023-11-20 DIAGNOSIS — R319 Hematuria, unspecified: Secondary | ICD-10-CM

## 2023-11-21 ENCOUNTER — Other Ambulatory Visit: Payer: Self-pay

## 2023-11-21 DIAGNOSIS — R319 Hematuria, unspecified: Secondary | ICD-10-CM

## 2023-11-22 ENCOUNTER — Encounter (HOSPITAL_COMMUNITY): Payer: Self-pay

## 2023-11-22 ENCOUNTER — Ambulatory Visit (HOSPITAL_COMMUNITY)
Admission: EM | Admit: 2023-11-22 | Discharge: 2023-11-22 | Disposition: A | Discharge status: Home / Self Care | Attending: Emergency Medicine | Admitting: Emergency Medicine

## 2023-11-22 DIAGNOSIS — H9202 Otalgia, left ear: Secondary | ICD-10-CM | POA: Diagnosis not present

## 2023-11-22 DIAGNOSIS — H6122 Impacted cerumen, left ear: Secondary | ICD-10-CM | POA: Diagnosis not present

## 2023-11-22 MED ORDER — CARBAMIDE PEROXIDE 6.5 % OT SOLN: 5.0000 [drp] | Freq: Every day | OTIC | 0 refills | Status: AC

## 2023-11-22 NOTE — ED Provider Notes (Signed)
 [facility]    CSN: [id number] Arrival date & time: 11/22/23  0805      History   Chief Complaint Chief Complaint  Patient presents with   Otalgia    HPI Dawn Rodriguez is a 88 y.o. female.   Patient presents to clinic over concerns of left ear pain that has been present for the past few days.  She knows she has some wax buildup so she got an over-the-counter kit from the store where you put in some drops and then warm water, did this yesterday and the day before.  Did not use the kit today because she is having ear pain.  She has not had any drainage from the ear.  Reports the ear pain is causing a left-sided headache.  Denies right ear pain. Without fevers.   The history is provided by the patient and medical records.  Otalgia   Past Medical History:  Diagnosis Date   Arthritis    Bilateral dry eyes    Cancer (HCC) 02/2018   left breast cancer   Cataracts, bilateral    Chronic headache    GERD (gastroesophageal reflux disease)    Hypertension    Personal history of radiation therapy    TMJ syndrome    Vaginal discharge 11/12/2018    Patient Active Problem List   Diagnosis Date Noted   Hematuria 11/19/2023   Need for zoster vaccination 11/06/2023   Encounter for annual health examination 11/06/2023   Influenza vaccination declined 08/30/2023   COVID-19 vaccination declined 08/30/2023   Atherosclerosis of aorta (HCC) 08/30/2023   Tingling sensation 08/30/2023   History of breast cancer 08/30/2023   Osteopenia of multiple sites 08/30/2023   Bumps on skin 05/10/2023   Dysuria 05/10/2023   Rectal pain 05/10/2023   Leukocytes in urine 05/10/2023   Herpes zoster vaccination declined 02/06/2023   Vitamin D deficiency 02/06/2023   Decreased estrogen level 02/06/2023   Anxiety 11/04/2022   Abnormal glucose 11/04/2022   Mixed hyperlipidemia 11/04/2022   Essential hypertension 11/04/2022   Encounter for Papanicolaou smear of cervix 11/12/2018    Cataracts, bilateral    GERD (gastroesophageal reflux disease)    Osteoarthritis 10/04/2007   Internal hemorrhoids 09/14/2007   Diverticulosis of colon 09/14/2007    Past Surgical History:  Procedure Laterality Date   BREAST BIOPSY Right 2012   stereotatic biopsy   BREAST BIOPSY Left 03/14/2018   BREAST BIOPSY Right 03/19/2020   BREAST LUMPECTOMY Left 04/06/2018   Procedure: LEFT BREAST LUMPECTOMY;  Surgeon: Glenna Fellows, MD;  Location: [facility];  Service: General;  Laterality: Left;   CATARACT EXTRACTION      OB History   No obstetric history on file.      Home Medications    Prior to Admission medications   Medication Sig Start Date End Date Taking? Authorizing Provider  amLODipine (NORVASC) 5 MG tablet TAKE 1 TABLET (5 MG TOTAL) BY MOUTH DAILY. 06/05/23  Yes Arnette Felts, FNP  atorvastatin (LIPITOR) 10 MG tablet Take 1 tablet (10 mg total) by mouth daily. 11/19/23  Yes Arnette Felts, FNP  Calcium Carb-Cholecalciferol (CALCIUM-VITAMIN D3) 600-400 MG-UNIT TABS Take by mouth 2 (two) times daily.   Yes [provider]  carbamide peroxide (DEBROX) 6.5 % OTIC solution Place 5 drops into both ears at bedtime. 11/22/23  Yes Rinaldo Ratel, [place] N, FNP  Garlic 1000 MG CAPS Take 1,000 mg by mouth daily.    Yes [provider]  KRILL OIL OMEGA-3 PO  Take 1 capsule by mouth daily.   Yes [provider]  Magnesium 250 MG TABS Take 1 tablet (250 mg total) by mouth daily. 11/04/22  Yes Arnette Felts, FNP  olopatadine (PATADAY) 0.1 % ophthalmic solution Place 1 drop into both eyes 2 (two) times daily. 12/31/20  Yes Serena Croissant, MD  omeprazole (PRILOSEC) 20 MG capsule Take 1 capsule (20 mg total) by mouth daily. 09/17/23  Yes Ward, Tylene Fantasia, PA-C  potassium gluconate 595 MG TABS Take 595 mg by mouth daily.   Yes [provider]  vitamin B-12 (CYANOCOBALAMIN) 1000 MCG tablet Take 1,000 mcg by mouth daily.   Yes [provider]     Family History Family History  Problem Relation Age of Onset   Pneumonia Mother     Social History Social History   Tobacco Use   Smoking status: Never   Smokeless tobacco: Never  Vaping Use   Vaping status: Never Used  Substance Use Topics   Alcohol use: No   Drug use: No     Allergies   Tobramycin   Review of Systems Review of Systems  Per HPI  Physical Exam Triage Vital Signs ED Triage Vitals  Encounter Vitals Group     BP 11/22/23 0817 (!) 153/70     Systolic BP Percentile --      Diastolic BP Percentile --      Pulse Rate 11/22/23 0819 76     Resp 11/22/23 0817 18     Temp 11/22/23 0817 98.7 F (37.1 C)     Temp src --      SpO2 11/22/23 0817 95 %     Weight --      Height --      Head Circumference --      Peak Flow --      Pain Score --      Pain Loc --      Pain Education --      Exclude from Growth Chart --    No data found.  Updated Vital Signs BP (!) 153/70 (BP Location: Left Arm)   Pulse 76   Temp 98.7 F (37.1 C)   Resp 18   SpO2 95%   Visual Acuity Right Eye Distance:   Left Eye Distance:   Bilateral Distance:    Right Eye Near:   Left Eye Near:    Bilateral Near:     Physical Exam Vitals and nursing note reviewed.  Constitutional:      Appearance: Normal appearance.  HENT:     Head: Normocephalic and atraumatic.     Right Ear: Tympanic membrane normal.     Left Ear: Tympanic membrane normal.     Ears:     Comments: Cerumen bilaterally, without impaction.    Nose: Nose normal.     Mouth/Throat:     Mouth: Mucous membranes are moist.  Eyes:     General: No scleral icterus. Cardiovascular:     Rate and Rhythm: Normal rate.  Pulmonary:     Effort: Pulmonary effort is normal. No respiratory distress.  Skin:    General: Skin is warm and dry.  Neurological:     General: No focal deficit present.     Mental Status: She is alert.  Psychiatric:        Mood and Affect: Mood normal.        Behavior: Behavior is  cooperative.      UC Treatments / Results  Labs (all labs ordered  are listed, but only abnormal results are displayed) Labs Reviewed - No data to display  EKG   Radiology No results found.  Procedures Procedures (including critical care time)  Medications Ordered in UC Medications - No data to display  Initial Impression / Assessment and Plan / UC Course  I have reviewed the triage vital signs and the nursing notes.  Pertinent labs & imaging results that were available during my care of the patient were reviewed by me and considered in my medical decision making (see chart for details).  Vitals and triage reviewed, patient is hemodynamically stable.  Left ear with significantly more wax than the right, without cerumen impaction.  Tympanic membranes intact and pearly gray bilaterally.  Staff performed cerumen removal with irrigation, manually removed some excess wax with curette, external ear canal without erythema or drainage, low concern for otitis externa or media at this time.  Symptomatic management wax buildup discussed. POC, f/u care and reviewed, patient is hemodynamically stable.      Final Clinical Impressions(s) / UC Diagnoses   Final diagnoses:  Otalgia of left ear  Excessive cerumen in left ear canal     Discharge Instructions      We cleaned out both of your ears today, they do not appear infected.  For any headache you can take 500 mg of Tylenol every 6-8 hours.  Use the wax softening drops daily.  Return to clinic for any new or urgent symptoms.     ED Prescriptions     Medication Sig Dispense Auth. Provider   carbamide peroxide (DEBROX) 6.5 % OTIC solution Place 5 drops into both ears at bedtime. 15 mL Trenace Coughlin, [place] N, FNP      PDMP not reviewed this encounter.   Rinaldo Ratel [place] N, [place] 11/22/23 [phone]

## 2023-11-22 NOTE — Discharge Instructions (Addendum)
 We cleaned out both of your ears today, they do not appear infected.  For any headache you can take 500 mg of Tylenol every 6-8 hours.  Use the wax softening drops daily.  Return to clinic for any new or urgent symptoms.

## 2023-11-22 NOTE — ED Triage Notes (Signed)
 Here for left ear pain and pressure x 2 days.

## 2023-11-23 LAB — URINE CULTURE

## 2023-12-10 ENCOUNTER — Other Ambulatory Visit: Payer: Self-pay | Admitting: Nurse Practitioner

## 2023-12-10 DIAGNOSIS — I10 Essential (primary) hypertension: Secondary | ICD-10-CM

## 2024-01-05 DIAGNOSIS — G5603 Carpal tunnel syndrome, bilateral upper limbs: Secondary | ICD-10-CM | POA: Diagnosis not present

## 2024-01-26 DIAGNOSIS — M25572 Pain in left ankle and joints of left foot: Secondary | ICD-10-CM | POA: Diagnosis not present

## 2024-01-26 DIAGNOSIS — G8929 Other chronic pain: Secondary | ICD-10-CM | POA: Diagnosis not present

## 2024-01-26 DIAGNOSIS — M79672 Pain in left foot: Secondary | ICD-10-CM | POA: Diagnosis not present

## 2024-01-26 DIAGNOSIS — M25571 Pain in right ankle and joints of right foot: Secondary | ICD-10-CM | POA: Diagnosis not present

## 2024-01-26 DIAGNOSIS — M79671 Pain in right foot: Secondary | ICD-10-CM | POA: Diagnosis not present

## 2024-01-29 DIAGNOSIS — H40013 Open angle with borderline findings, low risk, bilateral: Secondary | ICD-10-CM | POA: Diagnosis not present

## 2024-01-29 DIAGNOSIS — H18513 Endothelial corneal dystrophy, bilateral: Secondary | ICD-10-CM | POA: Diagnosis not present

## 2024-01-29 DIAGNOSIS — H1045 Other chronic allergic conjunctivitis: Secondary | ICD-10-CM | POA: Diagnosis not present

## 2024-02-20 ENCOUNTER — Encounter: Payer: Self-pay | Admitting: Internal Medicine

## 2024-02-20 ENCOUNTER — Other Ambulatory Visit (HOSPITAL_COMMUNITY)
Admission: RE | Admit: 2024-02-20 | Discharge: 2024-02-20 | Disposition: A | Discharge status: Home / Self Care | Source: Ambulatory Visit | Attending: Internal Medicine | Admitting: Internal Medicine

## 2024-02-20 ENCOUNTER — Ambulatory Visit: Payer: Self-pay | Admitting: Internal Medicine

## 2024-02-20 VITALS — BP 124/70 | HR 73 | Temp 98.7°F | Ht 64.0 in | Wt 158.6 lb

## 2024-02-20 DIAGNOSIS — Z01411 Encounter for gynecological examination (general) (routine) with abnormal findings: Secondary | ICD-10-CM | POA: Insufficient documentation

## 2024-02-20 DIAGNOSIS — I10 Essential (primary) hypertension: Secondary | ICD-10-CM

## 2024-02-20 DIAGNOSIS — Z1151 Encounter for screening for human papillomavirus (HPV): Secondary | ICD-10-CM | POA: Diagnosis not present

## 2024-02-20 DIAGNOSIS — E782 Mixed hyperlipidemia: Secondary | ICD-10-CM | POA: Diagnosis not present

## 2024-02-20 DIAGNOSIS — N760 Acute vaginitis: Secondary | ICD-10-CM | POA: Diagnosis not present

## 2024-02-20 DIAGNOSIS — R7309 Other abnormal glucose: Secondary | ICD-10-CM | POA: Diagnosis not present

## 2024-02-20 DIAGNOSIS — Z1211 Encounter for screening for malignant neoplasm of colon: Secondary | ICD-10-CM | POA: Diagnosis not present

## 2024-02-20 LAB — POCT URINALYSIS DIP (CLINITEK)
Bilirubin, UA: NEGATIVE
Glucose, UA: NEGATIVE mg/dL
Ketones, POC UA: NEGATIVE mg/dL
Leukocytes, UA: NEGATIVE
Nitrite, UA: NEGATIVE
POC PROTEIN,UA: NEGATIVE
Spec Grav, UA: 1.02 (ref 1.010–1.025)
Urobilinogen, UA: 1 U/dL
pH, UA: 7 (ref 5.0–8.0)

## 2024-02-20 NOTE — Progress Notes (Unsigned)
 I,Dawn Rodriguez, CMA,acting as a Neurosurgeon for Dawn LOISE Slocumb, MD.,have documented all relevant documentation on the behalf of Dawn LOISE Slocumb, MD,as directed by  Dawn LOISE Slocumb, MD while in the presence of Dawn LOISE Slocumb, MD.  Subjective:  Patient ID: Dawn Rodriguez , female    DOB: 1936/05/26 , 88 y.o.   MRN: [id number]  Chief Complaint  Patient presents with   Vaginal Discharge    Patient presents today for vaginal discharge. This has been an ongoing issue for a month. When she does her night routine she notices a dry brown crusty like discharge, or sometimes a dry  pale pink in her underwear. Denies any smell, itching, discomfort, increased urination.     HPI Discussed the use of AI scribe software for clinical note transcription with the patient, who gave verbal consent to proceed.  History of Present Illness Dawn Rodriguez is an 88 year old female who presents with vaginal discharge.  She has been experiencing a brown or yellow dry discharge on the lining of her underwear for the past month. There is no fresh blood, pain, or odor associated with the discharge. She is not sexually active and has not experienced any burning during urination. No history of recent yeast or urinary infections.  Her past medical history includes high blood pressure, managed with amlodipine  5 mg daily, and high cholesterol, managed with atorvastatin  10 mg daily. She is six years cancer-free from left breast cancer. She has had two children via vaginal delivery. Her last Pap smear was conducted in 2020.  No family history of cervical, uterine, or other cancers.  No itching, cramping, or blood in stools.   Vaginal Discharge The patient's primary symptoms include vaginal discharge. The current episode started in the past 7 days. The patient is experiencing no pain. She is not pregnant. Pertinent negatives include no dysuria, fever or flank pain.     Past Medical History:  Diagnosis Date    Arthritis    Bilateral dry eyes    Cancer (HCC) 02/2018   left breast cancer   Cataracts, bilateral    Chronic headache    GERD (gastroesophageal reflux disease)    Hypertension    Personal history of radiation therapy    TMJ syndrome    Vaginal discharge 11/12/2018     Family History  Problem Relation Age of Onset   Pneumonia Mother      Current Outpatient Medications:    amLODipine  (NORVASC ) 5 MG tablet, TAKE 1 TABLET (5 MG TOTAL) BY MOUTH DAILY., Disp: 90 tablet, Rfl: 1   atorvastatin  (LIPITOR) 10 MG tablet, Take 1 tablet (10 mg total) by mouth daily., Disp: 90 tablet, Rfl: 1   Calcium  Carb-Cholecalciferol (CALCIUM -VITAMIN D3) 600-400 MG-UNIT TABS, Take by mouth 2 (two) times daily., Disp: , Rfl:    carbamide peroxide (DEBROX) 6.5 % OTIC solution, Place 5 drops into both ears at bedtime., Disp: 15 mL, Rfl: 0   Garlic 1000 MG CAPS, Take 1,000 mg by mouth daily. , Disp: , Rfl:    KRILL OIL OMEGA-3 PO, Take 1 capsule by mouth daily., Disp: , Rfl:    Magnesium  250 MG TABS, Take 1 tablet (250 mg total) by mouth daily., Disp: 90 tablet, Rfl: 1   olopatadine  (PATADAY ) 0.1 % ophthalmic solution, Place 1 drop into both eyes 2 (two) times daily., Disp: 5 mL, Rfl: 12   omeprazole  (PRILOSEC) 20 MG capsule, Take 1 capsule (20 mg total) by mouth daily., Disp: 30 capsule,  Rfl: 0   potassium gluconate 595 MG TABS, Take 595 mg by mouth daily., Disp: , Rfl:    vitamin B-12 (CYANOCOBALAMIN ) 1000 MCG tablet, Take 1,000 mcg by mouth daily., Disp: , Rfl:    Allergies  Allergen Reactions   Tobramycin Itching, Swelling and Rash    Tobramycin eye drops caused swelling, itching, drainage, rash and peeling of the skin     Review of Systems  Constitutional: Negative.  Negative for fever.  Respiratory: Negative.    Cardiovascular: Negative.   Genitourinary:  Positive for vaginal discharge. Negative for dysuria and flank pain.  Neurological: Negative.   Psychiatric/Behavioral: Negative.        Today's Vitals   02/20/24 1442  BP: 124/70  Pulse: 73  Temp: 98.7 F (37.1 C)  SpO2: 98%  Weight: 158 lb 9.6 oz (71.9 kg)  Height: 5' 4 (1.626 m)   Body mass index is 27.22 kg/m.  Wt Readings from Last 3 Encounters:  02/20/24 158 lb 9.6 oz (71.9 kg)  11/06/23 159 lb (72.1 kg)  08/30/23 162 lb (73.5 kg)     Objective:  Physical Exam Vitals and nursing note reviewed. Exam conducted with a chaperone present.  Constitutional:      Appearance: Normal appearance.  HENT:     Head: Normocephalic and atraumatic.  Cardiovascular:     Rate and Rhythm: Normal rate and regular rhythm.     Heart sounds: Normal heart sounds.  Pulmonary:     Effort: Pulmonary effort is normal.     Breath sounds: Normal breath sounds.  Abdominal:     Hernia: A hernia is present.  Genitourinary:    Exam position: Supine.     Labia:        Right: No rash or tenderness.        Left: No rash or tenderness.      Cervix: Discharge and friability present.     Rectum: Guaiac result negative. No tenderness.     Comments: Brownish discharge in vault Atrophic vaginal mucosa Lymphadenopathy:     Lower Body: No right inguinal adenopathy. No left inguinal adenopathy.  Skin:    General: Skin is warm.  Neurological:     General: No focal deficit present.     Mental Status: She is alert.  Psychiatric:        Mood and Affect: Mood normal.        Behavior: Behavior normal.    Assessment And Plan:  Acute vaginitis Assessment & Plan: Pap smear performed.  - Check urinalysis  Orders: -     POCT URINALYSIS DIP (CLINITEK) -     Cytology - PAP  Essential hypertension Assessment & Plan: Chronic, well controlled. No med changes. She will continue with amlodipine  5mg  daily.  - Follow low sodium diet .   Mixed hyperlipidemia Assessment & Plan: Chronic, currently on atorvastatin   - Follow heart health lifestyle - Avoid processed meats and fried foods  Orders: -     TSH  Abnormal  glucose Assessment & Plan: Previous labs reviewed, her A1c has been elevated in the past. I will check an A1c today. Reminded to avoid refined sugars including sugary drinks/foods and processed meats including bacon, sausages and deli meats.    Orders: -     Hemoglobin A1c  Encounter for Hemoccult screening -     POCT occult blood stool  Return if symptoms worsen or fail to improve.  Patient was given opportunity to ask questions. Patient verbalized understanding of the  plan and was able to repeat key elements of the plan. All questions were answered to their satisfaction.    I, Dawn LOISE Slocumb, MD, have reviewed all documentation for this visit. The documentation on 02/20/24 for the exam, diagnosis, procedures, and orders are all accurate and complete.   IF YOU HAVE BEEN REFERRED TO A SPECIALIST, IT MAY TAKE 1-2 WEEKS TO SCHEDULE/PROCESS THE REFERRAL. IF YOU HAVE NOT HEARD FROM US /SPECIALIST IN TWO WEEKS, PLEASE GIVE US  A CALL AT [phone].   THE PATIENT IS ENCOURAGED TO PRACTICE SOCIAL DISTANCING DUE TO THE COVID-19 PANDEMIC.

## 2024-02-21 ENCOUNTER — Encounter: Payer: Self-pay | Admitting: Internal Medicine

## 2024-02-21 LAB — HEMOGLOBIN A1C
Est. average glucose Bld gHb Est-mCnc: 114 mg/dL
Hgb A1c MFr Bld: 5.6 % (ref 4.8–5.6)

## 2024-02-21 LAB — TSH: TSH: 0.85 u[IU]/mL (ref 0.450–4.500)

## 2024-02-24 ENCOUNTER — Ambulatory Visit: Payer: Self-pay | Admitting: Internal Medicine

## 2024-02-25 DIAGNOSIS — N760 Acute vaginitis: Secondary | ICD-10-CM | POA: Insufficient documentation

## 2024-02-25 NOTE — Assessment & Plan Note (Addendum)
 Intermittent discharge with possible vaginal hematuria.  Differential includes postmenopausal bleeding or atrophic vaginitis. Further investigation required. - Perform pelvic exam. - Conduct Pap smear. - Consider referral to gynecologist if Pap smear is abnormal. - Consider ultrasound of the uterus if indicated by Pap smear results.

## 2024-02-25 NOTE — Assessment & Plan Note (Signed)
 Chronic, currently on atorvastatin   - Follow heart health lifestyle - Avoid processed meats and fried foods

## 2024-02-25 NOTE — Assessment & Plan Note (Signed)
 Previous labs reviewed, her A1c has been elevated in the past. I will check an A1c today. Reminded to avoid refined sugars including sugary drinks/foods and processed meats including bacon, sausages and deli meats.

## 2024-02-25 NOTE — Assessment & Plan Note (Addendum)
 Chronic, well controlled. No med changes. She will continue with amlodipine  5mg  daily.  - Follow low sodium diet .

## 2024-02-26 LAB — HEMOCCULT GUIAC POC 1CARD (OFFICE): Fecal Occult Blood, POC: NEGATIVE

## 2024-02-27 LAB — CYTOLOGY - PAP
Comment: NEGATIVE
Comment: NEGATIVE
Diagnosis: NEGATIVE
High risk HPV: NEGATIVE
Trichomonas: NEGATIVE

## 2024-05-03 ENCOUNTER — Other Ambulatory Visit: Payer: Self-pay | Admitting: Nurse Practitioner

## 2024-05-03 DIAGNOSIS — Z1231 Encounter for screening mammogram for malignant neoplasm of breast: Secondary | ICD-10-CM

## 2024-05-08 ENCOUNTER — Ambulatory Visit (INDEPENDENT_AMBULATORY_CARE_PROVIDER_SITE_OTHER): Admitting: Nurse Practitioner

## 2024-05-08 ENCOUNTER — Encounter: Payer: Self-pay | Admitting: Nurse Practitioner

## 2024-05-08 VITALS — BP 120/60 | HR 61 | Temp 98.2°F | Ht 64.0 in | Wt 149.0 lb

## 2024-05-08 DIAGNOSIS — M150 Primary generalized (osteo)arthritis: Secondary | ICD-10-CM | POA: Diagnosis not present

## 2024-05-08 DIAGNOSIS — R7309 Other abnormal glucose: Secondary | ICD-10-CM | POA: Diagnosis not present

## 2024-05-08 DIAGNOSIS — I700 Atherosclerosis of aorta: Secondary | ICD-10-CM | POA: Diagnosis not present

## 2024-05-08 DIAGNOSIS — M8589 Other specified disorders of bone density and structure, multiple sites: Secondary | ICD-10-CM | POA: Diagnosis not present

## 2024-05-08 DIAGNOSIS — G5603 Carpal tunnel syndrome, bilateral upper limbs: Secondary | ICD-10-CM

## 2024-05-08 DIAGNOSIS — Z23 Encounter for immunization: Secondary | ICD-10-CM

## 2024-05-08 DIAGNOSIS — I119 Hypertensive heart disease without heart failure: Secondary | ICD-10-CM

## 2024-05-08 DIAGNOSIS — Z2821 Immunization not carried out because of patient refusal: Secondary | ICD-10-CM

## 2024-05-08 DIAGNOSIS — E782 Mixed hyperlipidemia: Secondary | ICD-10-CM

## 2024-05-08 MED ORDER — MOMETASONE FUROATE 0.1 % EX CREA: TOPICAL_CREAM | CUTANEOUS | 1 refills | Status: AC

## 2024-05-08 NOTE — Progress Notes (Signed)
 Dawn Rodriguez, CMA,acting as a Neurosurgeon for [name], FNP.,have documented all relevant documentation on the behalf of [name], FNP,as directed by  [name], FNP while in the presence of [name], FNP.  Subjective:  Patient ID: Dawn Rodriguez , female    DOB: [birth year] , 88 y.o.   MRN: [id number]  Chief Complaint  Patient presents with   Hypertension    Patient presents today for a bp and pre dm follow up, Patient reports compliance with medication. Patient denies any chest pain, SOB, or headaches. Patient has no concerns today.     HPI  She does not want surgery to her carpal tunnel would like to do conservative treatment. She also has pain to her feet and diagnosed with arthritis.   Hypertension This is a chronic problem. The current episode started more than 1 year ago. The problem is unchanged. Pertinent negatives include no chest pain, headaches or palpitations. Risk factors for coronary artery disease include dyslipidemia. Past treatments include calcium  channel blockers. There are no compliance problems.  There is no history of angina. There is no history of chronic renal disease.  Hyperlipidemia This is a chronic problem. The problem is controlled. Recent lipid tests were reviewed and are normal. She has no history of chronic renal disease. Pertinent negatives include no chest pain.     Past Medical History:  Diagnosis Date   Arthritis    Bilateral dry eyes    Cancer (HCC) 02/2018   left breast cancer   Cataracts, bilateral    Chronic headache    GERD (gastroesophageal reflux disease)    Hypertension    Personal history of radiation therapy    TMJ syndrome    Vaginal discharge 11/12/2018     Family History  Problem Relation Age of Onset   Pneumonia Mother    Breast cancer Neg Hx      Current Outpatient Medications:    amLODipine  (NORVASC ) 5 MG tablet, TAKE 1 TABLET (5 MG TOTAL) BY MOUTH DAILY., Disp: 90 tablet, Rfl: 1   atorvastatin   (LIPITOR) 10 MG tablet, Take 1 tablet (10 mg total) by mouth daily., Disp: 90 tablet, Rfl: 1   Calcium  Carb-Cholecalciferol (CALCIUM -VITAMIN D3) 600-400 MG-UNIT TABS, Take by mouth 2 (two) times daily., Disp: , Rfl:    carbamide peroxide (DEBROX) 6.5 % OTIC solution, Place 5 drops into both ears at bedtime., Disp: 15 mL, Rfl: 0   Garlic 1000 MG CAPS, Take 1,000 mg by mouth daily. , Disp: , Rfl:    KRILL OIL OMEGA-3 PO, Take 1 capsule by mouth daily., Disp: , Rfl:    Magnesium  250 MG TABS, Take 1 tablet (250 mg total) by mouth daily., Disp: 90 tablet, Rfl: 1   olopatadine  (PATADAY ) 0.1 % ophthalmic solution, Place 1 drop into both eyes 2 (two) times daily., Disp: 5 mL, Rfl: 12   omeprazole  (PRILOSEC) 20 MG capsule, Take 1 capsule (20 mg total) by mouth daily., Disp: 30 capsule, Rfl: 0   potassium gluconate 595 MG TABS, Take 595 mg by mouth daily., Disp: , Rfl:    vitamin B-12 (CYANOCOBALAMIN ) 1000 MCG tablet, Take 1,000 mcg by mouth daily., Disp: , Rfl:    mometasone  (ELOCON ) 0.1 % cream, Apply to affected area daily, Disp: 45 g, Rfl: 1   Allergies  Allergen Reactions   Tobramycin Itching, Swelling and Rash    Tobramycin eye drops caused swelling, itching, drainage, rash and peeling of the skin     Review of Systems  Constitutional: Negative.   Respiratory: Negative.    Cardiovascular:  Negative for chest pain, palpitations and leg swelling.  Neurological:  Negative for dizziness and headaches.  Psychiatric/Behavioral: Negative.       Today's Vitals   05/08/24 1452  BP: 120/60  Pulse: 61  Temp: 98.2 F (36.8 C)  TempSrc: Oral  Weight: 149 lb (67.6 kg)  Height: 5' 4 (1.626 m)  PainSc: 0-No pain   Body mass index is 25.58 kg/m.  Wt Readings from Last 3 Encounters:  05/08/24 149 lb (67.6 kg)  02/20/24 158 lb 9.6 oz (71.9 kg)  11/06/23 159 lb (72.1 kg)     Objective:  Physical Exam Vitals and nursing note reviewed.  Constitutional:      General: She is not in acute  distress.    Appearance: Normal appearance.  Cardiovascular:     Rate and Rhythm: Normal rate and regular rhythm.     Pulses: Normal pulses.     Heart sounds: Normal heart sounds. No murmur heard. Pulmonary:     Effort: Pulmonary effort is normal. No respiratory distress.     Breath sounds: Normal breath sounds. No wheezing.  Skin:    General: Skin is warm and dry.     Capillary Refill: Capillary refill takes less than 2 seconds.  Neurological:     General: No focal deficit present.     Mental Status: She is alert and oriented to person, place, and time.     Cranial Nerves: No cranial nerve deficit.     Motor: No weakness.  Psychiatric:        Mood and Affect: Mood normal.        Behavior: Behavior normal.        Thought Content: Thought content normal.        Judgment: Judgment normal.     Assessment And Plan:  Hypertensive heart disease without heart failure Assessment & Plan: Blood pressure is well controlled, continue current medications.   Orders: -     BMP8+eGFR  Mixed hyperlipidemia Assessment & Plan: Cholesterol levels (total) elevated. Continue statin. Tolerating well.   Orders: -     Lipid panel -     BMP8+eGFR  Abnormal glucose Assessment & Plan: HgbA1c is stable. Continue focusing on diet low in sugar and starches.   Orders: -     Hemoglobin A1c  Osteopenia of multiple sites  Bilateral carpal tunnel syndrome  COVID-19 vaccination declined Assessment & Plan: Declines covid 19 vaccine. Discussed risk of covid 60 and if she changes her mind about the vaccine to call the office. Education has been provided regarding the importance of this vaccine but patient still declined. Advised may receive this vaccine at local pharmacy or Health Dept.or vaccine clinic. Aware to provide a copy of the vaccination record if obtained from local pharmacy or Health Dept.  Encouraged to take multivitamin, vitamin d , vitamin c and zinc to increase immune system. Aware can  call office if would like to have vaccine here at office. Verbalized acceptance and understanding.    Need for zoster vaccination -     Varicella-zoster vaccine IM  Influenza vaccination declined Assessment & Plan: Patient declined influenza vaccination at this time. Patient is aware that influenza vaccine prevents illness in 70% of healthy people, and reduces hospitalizations to 30-70% in elderly. This vaccine is recommended annually. Education has been provided regarding the importance of this vaccine but patient still declined. Advised may receive this vaccine at local pharmacy or Health Dept.or  vaccine clinic. Aware to provide a copy of the vaccination record if obtained from local pharmacy or Health Dept.  Pt is willing to accept risk associated with refusing vaccination.  She will return in 2 weeks for nurse visit    Primary osteoarthritis involving multiple joints Assessment & Plan: She is to f/u with Orthopedics   Atherosclerosis of aorta Assessment & Plan: Continue statin   Other orders -     Mometasone  Furoate; Apply to affected area daily  Dispense: 45 g; Refill: 1    Return for schedule for flu vaccine in 2 weeks.  Patient was given opportunity to ask questions. Patient verbalized understanding of the plan and was able to repeat key elements of the plan. All questions were answered to their satisfaction.    Dawn [name], FNP, have reviewed all documentation for this visit. The documentation on 05/08/24 for the exam, diagnosis, procedures, and orders are all accurate and complete.   IF YOU HAVE BEEN REFERRED TO A SPECIALIST, IT MAY TAKE 1-2 WEEKS TO SCHEDULE/PROCESS THE REFERRAL. IF YOU HAVE NOT HEARD FROM US /SPECIALIST IN TWO WEEKS, PLEASE GIVE US  A CALL AT [phone].

## 2024-05-09 LAB — LIPID PANEL
Chol/HDL Ratio: 3.4 ratio (ref 0.0–4.4)
Cholesterol, Total: 217 mg/dL — ABNORMAL HIGH (ref 100–199)
HDL: 63 mg/dL (ref >39)
LDL Chol Calc (NIH): 143 mg/dL — ABNORMAL HIGH (ref 0–99)
Triglycerides: 63 mg/dL (ref 0–149)
VLDL Cholesterol Cal: 11 mg/dL (ref 5–40)

## 2024-05-09 LAB — BMP8+EGFR
BUN/Creatinine Ratio: 9 — ABNORMAL LOW (ref 12–28)
BUN: 7 mg/dL — ABNORMAL LOW (ref 8–27)
CO2: 24 mmol/L (ref 20–29)
Calcium: 9.5 mg/dL (ref 8.7–10.3)
Chloride: 104 mmol/L (ref 96–106)
Creatinine, Ser: 0.76 mg/dL (ref 0.57–1.00)
Glucose: 76 mg/dL (ref 70–99)
Potassium: 4 mmol/L (ref 3.5–5.2)
Sodium: 144 mmol/L (ref 134–144)
eGFR: 76 mL/min/1.73 (ref >59)

## 2024-05-09 LAB — HEMOGLOBIN A1C
Est. average glucose Bld gHb Est-mCnc: 111 mg/dL
Hgb A1c MFr Bld: 5.5 % (ref 4.8–5.6)

## 2024-05-13 ENCOUNTER — Ambulatory Visit
Admission: RE | Admit: 2024-05-13 | Discharge: 2024-05-13 | Disposition: A | Discharge status: Home / Self Care | Source: Ambulatory Visit | Attending: Nurse Practitioner | Admitting: Nurse Practitioner

## 2024-05-13 DIAGNOSIS — Z1231 Encounter for screening mammogram for malignant neoplasm of breast: Secondary | ICD-10-CM | POA: Diagnosis not present

## 2024-05-17 ENCOUNTER — Ambulatory Visit: Payer: Self-pay | Admitting: Nurse Practitioner

## 2024-05-17 DIAGNOSIS — E782 Mixed hyperlipidemia: Secondary | ICD-10-CM

## 2024-05-17 DIAGNOSIS — S76312A Strain of muscle, fascia and tendon of the posterior muscle group at thigh level, left thigh, initial encounter: Secondary | ICD-10-CM | POA: Diagnosis not present

## 2024-05-17 DIAGNOSIS — M25571 Pain in right ankle and joints of right foot: Secondary | ICD-10-CM | POA: Diagnosis not present

## 2024-05-17 DIAGNOSIS — M25572 Pain in left ankle and joints of left foot: Secondary | ICD-10-CM | POA: Diagnosis not present

## 2024-05-17 NOTE — Assessment & Plan Note (Signed)
 Blood pressure is well controlled, continue current medications.

## 2024-05-17 NOTE — Assessment & Plan Note (Signed)
HgbA1c is stable. Continue focusing on diet low in sugar and starches.

## 2024-05-17 NOTE — Assessment & Plan Note (Signed)
 She is to f/u with Orthopedics

## 2024-05-17 NOTE — Assessment & Plan Note (Addendum)
 Patient declined influenza vaccination at this time. Patient is aware that influenza vaccine prevents illness in 70% of healthy people, and reduces hospitalizations to 30-70% in elderly. This vaccine is recommended annually. Education has been provided regarding the importance of this vaccine but patient still declined. Advised may receive this vaccine at local pharmacy or Health Dept.or vaccine clinic. Aware to provide a copy of the vaccination record if obtained from local pharmacy or Health Dept.  Pt is willing to accept risk associated with refusing vaccination.  She will return in 2 weeks for nurse visit

## 2024-05-17 NOTE — Assessment & Plan Note (Signed)

## 2024-05-17 NOTE — Assessment & Plan Note (Signed)
 Cholesterol levels (total) elevated. Continue statin. Tolerating well.

## 2024-05-17 NOTE — Assessment & Plan Note (Signed)
 Continue statin

## 2024-05-18 ENCOUNTER — Other Ambulatory Visit: Payer: Self-pay | Admitting: Nurse Practitioner

## 2024-05-18 DIAGNOSIS — E782 Mixed hyperlipidemia: Secondary | ICD-10-CM

## 2024-05-21 ENCOUNTER — Ambulatory Visit (HOSPITAL_COMMUNITY): Admission: EM | Admit: 2024-05-21 | Discharge: 2024-05-21 | Disposition: A | Discharge status: Home / Self Care

## 2024-05-21 ENCOUNTER — Encounter (HOSPITAL_COMMUNITY): Payer: Self-pay

## 2024-05-21 DIAGNOSIS — R1310 Dysphagia, unspecified: Secondary | ICD-10-CM

## 2024-05-21 DIAGNOSIS — J029 Acute pharyngitis, unspecified: Secondary | ICD-10-CM | POA: Diagnosis not present

## 2024-05-21 NOTE — ED Triage Notes (Signed)
 Patient reports that she has had a sore throat x 2-3 weeks. Patiaenta denies a fever or other symptoms.  Patient states she has been taking Cold and Flu medication.

## 2024-05-21 NOTE — Discharge Instructions (Addendum)
 Symptoms and physical exam findings are more consistent with a possible esophageal issue. Do not see any sign of infectious process and symptoms have been going on for 3 weeks at least without other associated symptoms. Do not feel any enlarged lymph nodes or enlarged thyroid . With the history of GERD, feel this would benefit from discussion with the PCP and possible referral to gastroenterology. For now, monitor symptoms. If you feel they are worsening, the return to urgent care or go to the emergency room for further evaluation.

## 2024-05-21 NOTE — ED Provider Notes (Signed)
 [facility]    CSN: [id number] Arrival date & time: 05/21/24  1504      History   Chief Complaint Chief Complaint  Patient presents with   Sore Throat    HPI Dawn Rodriguez is a 88 y.o. female.   88 y.o. female who presents to urgent care with complaints of sore throat with trouble swallowing. This started at least 3 weeks ago. There has been no other associated symptoms. She denies fevers, chills, voice change, nausea, vomiting, cough, congestion, mouth sores. She reports it feels like it is in her neck when she tries to swallow. She denies anything coming back up halfway through swallowing. She does have a history of GERD and takes Prilosec.  The last time she had an upper endoscopy was over 10 years ago. She does relate she had problems with her tonsils when she was a teenager.    Sore Throat Pertinent negatives include no chest pain, no abdominal pain and no shortness of breath.    Past Medical History:  Diagnosis Date   Arthritis    Bilateral dry eyes    Cancer (HCC) 02/2018   left breast cancer   Cataracts, bilateral    Chronic headache    GERD (gastroesophageal reflux disease)    Hypertension    Personal history of radiation therapy    TMJ syndrome    Vaginal discharge 11/12/2018    Patient Active Problem List   Diagnosis Date Noted   Bilateral carpal tunnel syndrome 05/08/2024   Acute vaginitis 02/25/2024   Hematuria 11/19/2023   Need for zoster vaccination 11/06/2023   Encounter for annual health examination 11/06/2023   Influenza vaccination declined 08/30/2023   COVID-19 vaccination declined 08/30/2023   Atherosclerosis of aorta 08/30/2023   Tingling sensation 08/30/2023   History of breast cancer 08/30/2023   Osteopenia of multiple sites 08/30/2023   Bumps on skin 05/10/2023   Dysuria 05/10/2023   Rectal pain 05/10/2023   Leukocytes in urine 05/10/2023   Herpes zoster vaccination declined 02/06/2023   Vitamin D  deficiency  02/06/2023   Decreased estrogen level 02/06/2023   Anxiety 11/04/2022   Abnormal glucose 11/04/2022   Mixed hyperlipidemia 11/04/2022   Essential hypertension 11/04/2022   Encounter for Papanicolaou smear of cervix 11/12/2018   Cataracts, bilateral    GERD (gastroesophageal reflux disease)    Osteoarthritis 10/04/2007   Internal hemorrhoids 09/14/2007   Diverticulosis of colon 09/14/2007    Past Surgical History:  Procedure Laterality Date   BREAST BIOPSY Right 2012   stereotatic biopsy   BREAST BIOPSY Left 03/14/2018   BREAST BIOPSY Right 03/19/2020   BREAST LUMPECTOMY Left 04/06/2018   Procedure: LEFT BREAST LUMPECTOMY;  Surgeon: Mikell Katz, MD;  Location: [facility];  Service: General;  Laterality: Left;   CATARACT EXTRACTION      OB History   No obstetric history on file.      Home Medications    Prior to Admission medications   Medication Sig Start Date End Date Taking? Authorizing Provider  amLODipine  (NORVASC ) 5 MG tablet TAKE 1 TABLET (5 MG TOTAL) BY MOUTH DAILY. 12/11/23   Georgina Speaks, FNP  atorvastatin  (LIPITOR) 10 MG tablet TAKE 1 TABLET BY MOUTH EVERY DAY Patient not taking: Reported on 05/21/2024 05/20/24   Georgina Speaks, FNP  Calcium  Carb-Cholecalciferol (CALCIUM -VITAMIN D3) 600-400 MG-UNIT TABS Take by mouth 2 (two) times daily.    [provider]  carbamide peroxide (DEBROX) 6.5 % OTIC solution Place 5 drops into both  ears at bedtime. 11/22/23   Dreama, Georgia  N, FNP  Garlic 1000 MG CAPS Take 1,000 mg by mouth daily.     [provider]  KRILL OIL OMEGA-3 PO Take 1 capsule by mouth daily.    [provider]  Magnesium  250 MG TABS Take 1 tablet (250 mg total) by mouth daily. 11/04/22   Georgina Speaks, FNP  mometasone  (ELOCON ) 0.1 % cream Apply to affected area daily 05/08/24   Georgina Speaks, FNP  olopatadine  (PATADAY ) 0.1 % ophthalmic solution Place 1 drop into both eyes 2 (two) times daily. 12/31/20   Gudena,  Vinay, MD  omeprazole  (PRILOSEC) 20 MG capsule Take 1 capsule (20 mg total) by mouth daily. 09/17/23   Ward, Harlene PEDLAR, PA-C  potassium gluconate 595 MG TABS Take 595 mg by mouth daily.    [provider]  vitamin B-12 (CYANOCOBALAMIN ) 1000 MCG tablet Take 1,000 mcg by mouth daily.    [provider]    Family History Family History  Problem Relation Age of Onset   Pneumonia Mother    Breast cancer Neg Hx     Social History Social History   Tobacco Use   Smoking status: Never   Smokeless tobacco: Never  Vaping Use   Vaping status: Never Used  Substance Use Topics   Alcohol use: No   Drug use: No     Allergies   Tobramycin   Review of Systems Review of Systems  Constitutional:  Negative for appetite change, chills, fatigue and fever.  HENT:  Positive for sore throat and trouble swallowing. Negative for congestion, drooling, ear pain, rhinorrhea and voice change.   Eyes:  Negative for pain and visual disturbance.  Respiratory:  Negative for cough and shortness of breath.   Cardiovascular:  Negative for chest pain and palpitations.  Gastrointestinal:  Negative for abdominal pain and vomiting.  Genitourinary:  Negative for dysuria and hematuria.  Musculoskeletal:  Negative for arthralgias and back pain.  Skin:  Negative for color change and rash.  Neurological:  Negative for seizures and syncope.  All other systems reviewed and are negative.    Physical Exam Triage Vital Signs ED Triage Vitals [05/21/24 1620]  Encounter Vitals Group     BP 134/68     Girls Systolic BP Percentile      Girls Diastolic BP Percentile      Boys Systolic BP Percentile      Boys Diastolic BP Percentile      Pulse Rate 65     Resp 16     Temp 98.2 F (36.8 C)     Temp Source Oral     SpO2 97 %     Weight      Height      Head Circumference      Peak Flow      Pain Score 9     Pain Loc      Pain Education      Exclude from Growth Chart    No data  found.  Updated Vital Signs BP 134/68 (BP Location: Right Arm)   Pulse 65   Temp 98.2 F (36.8 C) (Oral)   Resp 16   SpO2 97%   Visual Acuity Right Eye Distance:   Left Eye Distance:   Bilateral Distance:    Right Eye Near:   Left Eye Near:    Bilateral Near:     Physical Exam Vitals and nursing note reviewed.  Constitutional:      General:  She is not in acute distress.    Appearance: She is well-developed.  HENT:     Head: Normocephalic and atraumatic.  Eyes:     Conjunctiva/sclera: Conjunctivae normal.  Neck:     Thyroid : No thyroid  mass or thyromegaly.     Trachea: No tracheal tenderness.  Cardiovascular:     Rate and Rhythm: Normal rate and regular rhythm.     Heart sounds: No murmur heard. Pulmonary:     Effort: Pulmonary effort is normal. No respiratory distress.     Breath sounds: Normal breath sounds.  Abdominal:     Palpations: Abdomen is soft.     Tenderness: There is no abdominal tenderness.  Musculoskeletal:        General: No swelling.     Cervical back: Neck supple.  Lymphadenopathy:     Cervical: No cervical adenopathy.     Right cervical: No superficial cervical adenopathy.    Left cervical: No superficial cervical adenopathy.  Skin:    General: Skin is warm and dry.     Capillary Refill: Capillary refill takes less than 2 seconds.  Neurological:     Mental Status: She is alert.  Psychiatric:        Mood and Affect: Mood normal.      UC Treatments / Results  Labs (all labs ordered are listed, but only abnormal results are displayed) Labs Reviewed - No data to display  EKG   Radiology No results found.  Procedures Procedures (including critical care time)  Medications Ordered in UC Medications - No data to display  Initial Impression / Assessment and Plan / UC Course  I have reviewed the triage vital signs and the nursing notes.  Pertinent labs & imaging results that were available during my care of the patient were reviewed  by me and considered in my medical decision making (see chart for details).     Sore throat  Dysphagia, unspecified type   Symptoms and physical exam findings are more consistent with a possible esophageal issue. Do not see any sign of infectious process and symptoms have been going on for 3 weeks at least without other associated symptoms. Do not feel any enlarged lymph nodes or enlarged thyroid . With the history of GERD, feel this would benefit from discussion with the PCP and possible referral to gastroenterology. For now, monitor symptoms. If you feel they are worsening, the return to urgent care or go to the emergency room for further evaluation.   Final Clinical Impressions(s) / UC Diagnoses   Final diagnoses:  Sore throat  Dysphagia, unspecified type     Discharge Instructions      Symptoms and physical exam findings are more consistent with a possible esophageal issue. Do not see any sign of infectious process and symptoms have been going on for 3 weeks at least without other associated symptoms. Do not feel any enlarged lymph nodes or enlarged thyroid . With the history of GERD, feel this would benefit from discussion with the PCP and possible referral to gastroenterology. For now, monitor symptoms. If you feel they are worsening, the return to urgent care or go to the emergency room for further evaluation.     ED Prescriptions   None    PDMP not reviewed this encounter.   Teresa Almarie LABOR, PA-C 05/21/24 1642

## 2024-05-30 ENCOUNTER — Other Ambulatory Visit: Payer: Self-pay | Admitting: Nurse Practitioner

## 2024-05-30 DIAGNOSIS — I10 Essential (primary) hypertension: Secondary | ICD-10-CM

## 2024-06-04 ENCOUNTER — Ambulatory Visit

## 2024-06-04 NOTE — Progress Notes (Deleted)
 Fluzone high dose administered left deltoid  -pt tolerated injection well

## 2024-06-06 MED ORDER — ATORVASTATIN CALCIUM 20 MG PO TABS: 20.0000 mg | ORAL_TABLET | Freq: Every day | ORAL | 1 refills | Status: AC

## 2024-06-07 ENCOUNTER — Telehealth: Payer: Self-pay

## 2024-06-07 NOTE — Telephone Encounter (Unsigned)
 Copied from CRM [id number]. Topic: Clinical - Lab/Test Results >> Jun 07, 2024 11:29 AM Myrick T wrote: Reason for CRM: advised patient of lab results but she say the atorvastatin  gives her a headache and makes her nauseous. She would like a different medication. Please f/u

## 2024-06-12 ENCOUNTER — Encounter: Payer: Self-pay | Admitting: Nurse Practitioner

## 2024-06-12 ENCOUNTER — Ambulatory Visit: Admitting: Nurse Practitioner

## 2024-06-12 VITALS — BP 130/60 | HR 80 | Temp 98.9°F | Ht 64.0 in | Wt 150.8 lb

## 2024-06-12 DIAGNOSIS — R82998 Other abnormal findings in urine: Secondary | ICD-10-CM

## 2024-06-12 DIAGNOSIS — E782 Mixed hyperlipidemia: Secondary | ICD-10-CM

## 2024-06-12 DIAGNOSIS — Z23 Encounter for immunization: Secondary | ICD-10-CM

## 2024-06-12 DIAGNOSIS — R3121 Asymptomatic microscopic hematuria: Secondary | ICD-10-CM | POA: Diagnosis not present

## 2024-06-12 DIAGNOSIS — I10 Essential (primary) hypertension: Secondary | ICD-10-CM

## 2024-06-12 LAB — POCT URINALYSIS DIPSTICK
Bilirubin, UA: NEGATIVE
Blood, UA: NEGATIVE
Glucose, UA: NEGATIVE
Ketones, UA: NEGATIVE
Nitrite, UA: NEGATIVE
Protein, UA: NEGATIVE
Spec Grav, UA: 1.015 (ref 1.010–1.025)
Urobilinogen, UA: 1 U/dL
pH, UA: 7 (ref 5.0–8.0)

## 2024-06-12 MED ORDER — OLMESARTAN MEDOXOMIL 20 MG PO TABS: ORAL_TABLET | ORAL | 1 refills | Status: DC

## 2024-06-12 MED ORDER — EZETIMIBE 10 MG PO TABS: 10.0000 mg | ORAL_TABLET | Freq: Every day | ORAL | 1 refills | Status: AC

## 2024-06-12 NOTE — Progress Notes (Deleted)
 Subjective:  Patient ID: Dawn Rodriguez , female    DOB: [birth year] , 88 y.o.   MRN: [id number]  No chief complaint on file.   HPI HPI  Discussed the use of AI scribe software for clinical note transcription with the patient, who gave verbal consent to proceed.      Past Medical History:  Diagnosis Date  . Arthritis   . Bilateral dry eyes   . Cancer (HCC) 02/2018   left breast cancer  . Cataracts, bilateral   . Chronic headache   . GERD (gastroesophageal reflux disease)   . Hypertension   . Personal history of radiation therapy   . TMJ syndrome   . Vaginal discharge 11/12/2018     Family History  Problem Relation Age of Onset  . Pneumonia Mother   . Breast cancer Neg Hx      Current Outpatient Medications:  .  amLODipine  (NORVASC ) 5 MG tablet, TAKE 1 TABLET (5 MG TOTAL) BY MOUTH DAILY., Disp: 90 tablet, Rfl: 1 .  atorvastatin  (LIPITOR) 10 MG tablet, TAKE 1 TABLET BY MOUTH EVERY DAY (Patient not taking: Reported on 05/21/2024), Disp: 90 tablet, Rfl: 1 .  atorvastatin  (LIPITOR) 20 MG tablet, Take 1 tablet (20 mg total) by mouth daily., Disp: 90 tablet, Rfl: 1 .  Calcium  Carb-Cholecalciferol (CALCIUM -VITAMIN D3) 600-400 MG-UNIT TABS, Take by mouth 2 (two) times daily., Disp: , Rfl:  .  carbamide peroxide (DEBROX) 6.5 % OTIC solution, Place 5 drops into both ears at bedtime., Disp: 15 mL, Rfl: 0 .  Garlic 1000 MG CAPS, Take 1,000 mg by mouth daily. , Disp: , Rfl:  .  KRILL OIL OMEGA-3 PO, Take 1 capsule by mouth daily., Disp: , Rfl:  .  Magnesium  250 MG TABS, Take 1 tablet (250 mg total) by mouth daily., Disp: 90 tablet, Rfl: 1 .  mometasone  (ELOCON ) 0.1 % cream, Apply to affected area daily, Disp: 45 g, Rfl: 1 .  olopatadine  (PATADAY ) 0.1 % ophthalmic solution, Place 1 drop into both eyes 2 (two) times daily., Disp: 5 mL, Rfl: 12 .  omeprazole  (PRILOSEC) 20 MG capsule, Take 1 capsule (20 mg total) by mouth daily., Disp: 30 capsule, Rfl: 0 .  potassium gluconate  595 MG TABS, Take 595 mg by mouth daily., Disp: , Rfl:  .  vitamin B-12 (CYANOCOBALAMIN ) 1000 MCG tablet, Take 1,000 mcg by mouth daily., Disp: , Rfl:    Allergies  Allergen Reactions  . Tobramycin Itching, Swelling and Rash    Tobramycin eye drops caused swelling, itching, drainage, rash and peeling of the skin     Review of Systems   There were no vitals filed for this visit. There is no height or weight on file to calculate BMI.  Wt Readings from Last 3 Encounters:  05/08/24 149 lb (67.6 kg)  02/20/24 158 lb 9.6 oz (71.9 kg)  11/06/23 159 lb (72.1 kg)    The ASCVD Risk score (Arnett DK, et al., 2019) failed to calculate for the following reasons:   The 2019 ASCVD risk score is only valid for ages 17 to 91  Objective:  Physical Exam      Assessment And Plan:  There are no diagnoses linked to this encounter.  Assessment and Plan      No follow-ups on file.  Patient was given opportunity to ask questions. Patient verbalized understanding of the plan and was able to repeat key elements of the plan. All questions were answered to their satisfaction.  LILLETTE [name], FNP, have reviewed all documentation for this visit. The documentation on 06/12/24 for the exam, diagnosis, procedures, and orders are all accurate and complete.   IF YOU HAVE BEEN REFERRED TO A SPECIALIST, IT MAY TAKE 1-2 WEEKS TO SCHEDULE/PROCESS THE REFERRAL. IF YOU HAVE NOT HEARD FROM US /SPECIALIST IN TWO WEEKS, PLEASE GIVE US  A CALL AT [phone] X [phone].

## 2024-06-12 NOTE — Progress Notes (Signed)
 Subjective:  Patient ID: Dawn Rodriguez , female    DOB: [birth year] , 88 y.o.   MRN: [id number]  Chief Complaint  Patient presents with   Hypertension    Patient reports her amLODipine  was giving her headache and made her throat close. She has stopped her amlodipine  about 2 weeks ago and her symptoms has subsided. She reports the atorvastatin  made her feel nausea she has stopped it as well.     HPI Discussed the use of AI scribe software for clinical note transcription with the patient, who gave verbal consent to proceed.  History of Present Illness Dawn Rodriguez is an 88 year old female with hypertension who presents for follow-up on blood pressure management and recent medication side effects.  She stopped taking olivapine due to dizziness, headaches, and suspected throat swelling. After discontinuation, her symptoms resolved, including the sore throat and lightheadedness. She visited urgent care where no throat infection was found.  She also stopped taking another medication, which was increased for blood pressure management, due to nausea. She was previously on a 10 mg dose, which was increased to 20 mg, but she is currently not taking any of it. She suspects the combination with lorazepam might have contributed to her symptoms, but she is unsure.  She has been on amlodipine  for a while, initially at 2.5 mg, which was increased to 5 mg. She experienced headaches that were manageable with Tylenol  but decided to stop the medication due to discomfort. The headaches and lightheadedness ceased after stopping amlodipine .  She inquires about the ongoing issue of hematuria, which was previously noted as moderate and then trace. She is unsure if it persists but recalls being told it was improving. She drinks a lot of water and is concerned about the cause.  She mentions a longstanding goiter, which was biopsied in 2007 and found to be non-cancerous. It has not changed in size or  caused pain over the years.     Past Medical History:  Diagnosis Date   Arthritis    Bilateral dry eyes    Cancer (HCC) 02/2018   left breast cancer   Cataracts, bilateral    Chronic headache    GERD (gastroesophageal reflux disease)    Hypertension    Personal history of radiation therapy    TMJ syndrome    Vaginal discharge 11/12/2018     Family History  Problem Relation Age of Onset   Pneumonia Mother    Breast cancer Neg Hx      Current Outpatient Medications:    Calcium  Carb-Cholecalciferol (CALCIUM -VITAMIN D3) 600-400 MG-UNIT TABS, Take by mouth 2 (two) times daily., Disp: , Rfl:    carbamide peroxide (DEBROX) 6.5 % OTIC solution, Place 5 drops into both ears at bedtime., Disp: 15 mL, Rfl: 0   ezetimibe (ZETIA) 10 MG tablet, Take 1 tablet (10 mg total) by mouth daily., Disp: 90 tablet, Rfl: 1   Garlic 1000 MG CAPS, Take 1,000 mg by mouth daily. , Disp: , Rfl:    KRILL OIL OMEGA-3 PO, Take 1 capsule by mouth daily., Disp: , Rfl:    Magnesium  250 MG TABS, Take 1 tablet (250 mg total) by mouth daily., Disp: 90 tablet, Rfl: 1   mometasone  (ELOCON ) 0.1 % cream, Apply to affected area daily, Disp: 45 g, Rfl: 1   olmesartan (BENICAR) 20 MG tablet, Take 1/2 tablet by mouth daily, Disp: 45 tablet, Rfl: 1   olopatadine  (PATADAY ) 0.1 % ophthalmic solution, Place 1 drop  into both eyes 2 (two) times daily., Disp: 5 mL, Rfl: 12   omeprazole  (PRILOSEC) 20 MG capsule, Take 1 capsule (20 mg total) by mouth daily., Disp: 30 capsule, Rfl: 0   potassium gluconate 595 MG TABS, Take 595 mg by mouth daily., Disp: , Rfl:    vitamin B-12 (CYANOCOBALAMIN ) 1000 MCG tablet, Take 1,000 mcg by mouth daily., Disp: , Rfl:    amLODipine  (NORVASC ) 5 MG tablet, TAKE 1 TABLET (5 MG TOTAL) BY MOUTH DAILY. (Patient not taking: Reported on 06/12/2024), Disp: 90 tablet, Rfl: 1   atorvastatin  (LIPITOR) 10 MG tablet, TAKE 1 TABLET BY MOUTH EVERY DAY (Patient not taking: Reported on 06/12/2024), Disp: 90 tablet,  Rfl: 1   atorvastatin  (LIPITOR) 20 MG tablet, Take 1 tablet (20 mg total) by mouth daily. (Patient not taking: Reported on 06/12/2024), Disp: 90 tablet, Rfl: 1   Allergies  Allergen Reactions   Tobramycin Itching, Swelling and Rash    Tobramycin eye drops caused swelling, itching, drainage, rash and peeling of the skin     Review of Systems  Constitutional: Negative.   Respiratory: Negative.    Cardiovascular:  Negative for chest pain, palpitations and leg swelling.  Neurological:  Negative for dizziness and headaches.  Psychiatric/Behavioral: Negative.       Today's Vitals   06/12/24 1506 06/12/24 1557  BP: (!) 140/62 130/60  Pulse: 80   Temp: 98.9 F (37.2 C)   TempSrc: Oral   Weight: 150 lb 12.8 oz (68.4 kg)   Height: 5' 4 (1.626 m)   PainSc: 0-No pain    Body mass index is 25.88 kg/m.  Wt Readings from Last 3 Encounters:  06/12/24 150 lb 12.8 oz (68.4 kg)  05/08/24 149 lb (67.6 kg)  02/20/24 158 lb 9.6 oz (71.9 kg)     Objective:  Physical Exam Vitals and nursing note reviewed.  Constitutional:      General: She is not in acute distress.    Appearance: Normal appearance.  Cardiovascular:     Rate and Rhythm: Normal rate and regular rhythm.     Pulses: Normal pulses.     Heart sounds: Normal heart sounds. No murmur heard. Pulmonary:     Effort: Pulmonary effort is normal. No respiratory distress.     Breath sounds: Normal breath sounds. No wheezing.  Skin:    General: Skin is warm and dry.     Capillary Refill: Capillary refill takes less than 2 seconds.  Neurological:     General: No focal deficit present.     Mental Status: She is alert and oriented to person, place, and time.     Cranial Nerves: No cranial nerve deficit.     Motor: No weakness.  Psychiatric:        Mood and Affect: Mood normal.        Behavior: Behavior normal.        Thought Content: Thought content normal.        Judgment: Judgment normal.         Assessment And Plan:   Essential hypertension Assessment & Plan: Symptoms improved after reducing amlodipine  dose. Olmesartan prescribed for blood pressure management. - Prescribe olmesartan, take half a tablet daily for blood pressure management.  Orders: -     Olmesartan Medoxomil; Take 1/2 tablet by mouth daily  Dispense: 45 tablet; Refill: 1  Mixed hyperlipidemia Assessment & Plan: Cholesterol levels elevated. Atorvastatin  increased to 20 mg. Ezetimibe prescribed for LDL management. - Increase atorvastatin  to 20 mg, take  two tablets of 10 mg until gone, then switch to new prescription. - Prescribe ezetimibe to take daily for LDL management.  Orders: -     Ezetimibe; Take 1 tablet (10 mg total) by mouth daily.  Dispense: 90 tablet; Refill: 1  Asymptomatic microscopic hematuria Assessment & Plan: Trace blood in urine. Possible causes include irritation or insufficient water intake. - Repeat urinalysis to assess current status of hematuria.  Orders: -     POCT urinalysis dipstick  Need for influenza vaccination Assessment & Plan: Influenza vaccine administered Encouraged to take Tylenol  as needed for fever or muscle aches.   Orders: -     Flu vaccine HIGH DOSE PF(Fluzone Trivalent)  Leukocytes in urine -     Urine Culture    Return for keep same next, 2W NV BP.  Patient was given opportunity to ask questions. Patient verbalized understanding of the plan and was able to repeat key elements of the plan. All questions were answered to their satisfaction.    LILLETTE [name], FNP, have reviewed all documentation for this visit. The documentation on 06/12/24 for the exam, diagnosis, procedures, and orders are all accurate and complete.   IF YOU HAVE BEEN REFERRED TO A SPECIALIST, IT MAY TAKE 1-2 WEEKS TO SCHEDULE/PROCESS THE REFERRAL. IF YOU HAVE NOT HEARD FROM US /SPECIALIST IN TWO WEEKS, PLEASE GIVE US  A CALL AT [phone] X [phone].

## 2024-06-13 LAB — URINE CULTURE

## 2024-06-18 ENCOUNTER — Ambulatory Visit: Payer: Self-pay | Admitting: Nurse Practitioner

## 2024-06-19 DIAGNOSIS — Z23 Encounter for immunization: Secondary | ICD-10-CM | POA: Insufficient documentation

## 2024-06-19 NOTE — Assessment & Plan Note (Signed)
 Trace blood in urine. Possible causes include irritation or insufficient water intake. - Repeat urinalysis to assess current status of hematuria.

## 2024-06-19 NOTE — Assessment & Plan Note (Signed)
 Influenza vaccine administered Encouraged to take Tylenol as needed for fever or muscle aches.

## 2024-06-19 NOTE — Assessment & Plan Note (Signed)
 Cholesterol levels elevated. Atorvastatin  increased to 20 mg. Ezetimibe prescribed for LDL management. - Increase atorvastatin  to 20 mg, take two tablets of 10 mg until gone, then switch to new prescription. - Prescribe ezetimibe to take daily for LDL management.

## 2024-06-19 NOTE — Assessment & Plan Note (Signed)
 Symptoms improved after reducing amlodipine  dose. Olmesartan prescribed for blood pressure management. - Prescribe olmesartan, take half a tablet daily for blood pressure management.

## 2024-06-25 ENCOUNTER — Ambulatory Visit: Payer: Self-pay

## 2024-06-25 VITALS — BP 110/70 | HR 77 | Temp 98.9°F | Ht 64.0 in | Wt 150.0 lb

## 2024-06-25 DIAGNOSIS — I10 Essential (primary) hypertension: Secondary | ICD-10-CM

## 2024-06-25 NOTE — Progress Notes (Signed)
 Patient presents today for a bpc. Patient reports compliance with her meds. She is taking olmesartan 20mg  1/2  tablet In the morning.Patient reports her blood pressure may be high due to her having a recent death in the family. I checked her blood pressure and it was 110/70 P77. Patient advised to continue with her current medication. She was also advised to make sure she stays well hydrated. Patient is to keep her next appointment. YL,RMA   BP Readings from Last 3 Encounters:  06/12/24 130/60  05/21/24 134/68  05/08/24 120/60

## 2024-07-04 DIAGNOSIS — K5730 Diverticulosis of large intestine without perforation or abscess without bleeding: Secondary | ICD-10-CM | POA: Diagnosis not present

## 2024-07-04 DIAGNOSIS — K219 Gastro-esophageal reflux disease without esophagitis: Secondary | ICD-10-CM | POA: Diagnosis not present

## 2024-07-04 DIAGNOSIS — R1310 Dysphagia, unspecified: Secondary | ICD-10-CM | POA: Diagnosis not present

## 2024-07-05 ENCOUNTER — Other Ambulatory Visit: Payer: Self-pay | Admitting: Gastroenterology

## 2024-07-05 DIAGNOSIS — R1310 Dysphagia, unspecified: Secondary | ICD-10-CM

## 2024-07-31 DIAGNOSIS — B351 Tinea unguium: Secondary | ICD-10-CM | POA: Diagnosis not present

## 2024-07-31 DIAGNOSIS — M25572 Pain in left ankle and joints of left foot: Secondary | ICD-10-CM | POA: Diagnosis not present

## 2024-07-31 DIAGNOSIS — S76312A Strain of muscle, fascia and tendon of the posterior muscle group at thigh level, left thigh, initial encounter: Secondary | ICD-10-CM | POA: Diagnosis not present

## 2024-07-31 DIAGNOSIS — G609 Hereditary and idiopathic neuropathy, unspecified: Secondary | ICD-10-CM | POA: Diagnosis not present

## 2024-07-31 DIAGNOSIS — G8929 Other chronic pain: Secondary | ICD-10-CM | POA: Diagnosis not present

## 2024-07-31 DIAGNOSIS — M25571 Pain in right ankle and joints of right foot: Secondary | ICD-10-CM | POA: Diagnosis not present

## 2024-09-10 NOTE — Progress Notes (Signed)
 LILLETTE Kristeen JINNY [name], CMA,acting as a neurosurgeon for Dawn Ada, Dawn Rodriguez.,have documented all relevant documentation on the behalf of Dawn Ada, Dawn Rodriguez,as directed by  Dawn Ada, Dawn Rodriguez while in the presence of Dawn Ada, Dawn Rodriguez.  Subjective:  Patient ID: Dawn Rodriguez , female    DOB: 10-22-1935 , 89 y.o.   MRN: [id number]  No chief complaint on file.   Had her AWV done today.     Discussed the use of AI scribe software for clinical note transcription with the patient, who gave verbal consent to proceed.  History of Present Illness Dawn Rodriguez is an 89 year old female with reflux who presents with swallowing difficulties and reflux symptoms.  She initially experienced swallowing difficulties and had scheduled an esophagus x-ray, but the appointment details were lost. She later confirmed the appointment is scheduled for tomorrow. The swallowing difficulties have since resolved, and she no longer feels discomfort when swallowing.  She has a history of reflux and was previously taking omeprazole , which was discontinued in 2024. She experiences reflux symptoms, particularly a 'bile feeling' after eating, especially if she does not take a pill from the health food store before meals. Greasy and fried foods exacerbate her symptoms.  She is currently taking atorvastatin  for cholesterol and amlodipine  for blood pressure. She no longer experiences headaches from atorvastatin . She was initially confused about her medication regimen but clarified that she is taking amlodipine  and not the previously prescribed olmesartan .  She has a history of arthritis, particularly in her feet, diagnosed by an orthopedist. She was prescribed a cream and vitamin supplements, including vitamin B complex and vitamin C, which have helped reduce tingling and pain in her feet.  She volunteers at a [employer] and receives a stipend.  Past Medical History:  Diagnosis Date   Arthritis    Bilateral dry eyes     Cancer (HCC) 02/2018   left breast cancer   Cataracts, bilateral    Chronic headache    GERD (gastroesophageal reflux disease)    Hypertension    Personal history of radiation therapy    TMJ syndrome    Vaginal discharge 11/12/2018     Family History  Problem Relation Age of Onset   Pneumonia Mother    Breast cancer Neg Hx     Current Medications[1]   Allergies[2]   Review of Systems  Constitutional: Negative.   Respiratory: Negative.    Cardiovascular:  Negative for chest pain, palpitations and leg swelling.  Neurological:  Negative for dizziness and headaches.  Psychiatric/Behavioral: Negative.       Today's Vitals   09/11/24 1029  BP: 138/60  Pulse: 72  Temp: 98.5 F (36.9 C)  TempSrc: Oral  Weight: 148 lb (67.1 kg)  Height: 5' 4 (1.626 m)  PainSc: 0-No pain   Body mass index is 25.4 kg/m.  Wt Readings from Last 3 Encounters:  09/11/24 148 lb (67.1 kg)  09/11/24 148 lb 3.2 oz (67.2 kg)  06/25/24 150 lb (68 kg)     Objective:  Physical Exam Vitals and nursing note reviewed.  Constitutional:      General: She is not in acute distress.    Appearance: Normal appearance.  Cardiovascular:     Rate and Rhythm: Normal rate and regular rhythm.     Pulses: Normal pulses.     Heart sounds: Normal heart sounds. No murmur heard. Pulmonary:     Effort: Pulmonary effort is normal. No respiratory distress.     Breath sounds:  Normal breath sounds. No wheezing.  Skin:    General: Skin is warm and dry.     Capillary Refill: Capillary refill takes less than 2 seconds.  Neurological:     General: No focal deficit present.     Mental Status: She is alert and oriented to person, place, and time.     Cranial Nerves: No cranial nerve deficit.     Motor: No weakness.  Psychiatric:        Mood and Affect: Mood normal.        Behavior: Behavior normal.        Thought Content: Thought content normal.        Judgment: Judgment normal.       Assessment And Plan:    Assessment & Plan Essential hypertension Blood pressure controlled with amlodipine . Previous side effects resolved. - Continue amlodipine  5 mg daily only, I have discontinued her olmesartan  Mixed hyperlipidemia Cholesterol elevated. Atorvastatin  and ezetimibe  tolerated without side effects. - Continue atorvastatin  as prescribed. - Continue ezetimibe  10 mg daily. Abnormal glucose A1c monitoring required for glucose control assessment. - Ordered A1c test. Atherosclerosis of aorta Continue statin Vitamin D  deficiency Will check vitamin D  level and supplement as needed.    Also encouraged to spend 15 minutes in the sun daily.   Gastroesophageal reflux disease without esophagitis Reflux symptoms returned, likely due to diet. Omeprazole  needed again. - Refilled omeprazole  temporarily. - Advised dietary modifications to reduce intake of greasy and fried foods. - Encouraged increased consumption of vegetables and fruits. Primary osteoarthritis of both feet Pain and swelling improving with topical cream and vitamins. - Continue using topical cream as prescribed. - Continue vitamin B complex and vitamin C supplementation.  Received shingles vaccine and flu shot at the pharmacy will try to get the records to update. Declined tetanus vaccine previously this may be related to her insurance not covering will check at next visit.  Orders Placed This Encounter  Procedures   Lipid panel   BMP8+eGFR     Return for keep same next.  Patient was given opportunity to ask questions. Patient verbalized understanding of the plan and was able to repeat key elements of the plan. All questions were answered to their satisfaction.    LILLETTE Dawn Ada, Dawn Rodriguez, have reviewed all documentation for this visit. The documentation on 09/11/24 for the exam, diagnosis, procedures, and orders are all accurate and complete.   IF YOU HAVE BEEN REFERRED TO A SPECIALIST, IT MAY TAKE 1-2 WEEKS TO SCHEDULE/PROCESS THE  REFERRAL. IF YOU HAVE NOT HEARD FROM US /SPECIALIST IN TWO WEEKS, PLEASE GIVE US  A CALL AT [phone].      [1]  Current Outpatient Medications:    amLODipine  (NORVASC ) 5 MG tablet, TAKE 1 TABLET (5 MG TOTAL) BY MOUTH DAILY., Disp: 90 tablet, Rfl: 1   Ascorbic Acid (VITAMIN C) 500 MG CAPS, Take 2 tablets by mouth 2 (two) times daily., Disp: , Rfl:    atorvastatin  (LIPITOR) 20 MG tablet, Take 1 tablet (20 mg total) by mouth daily. (Patient not taking: Reported on 09/11/2024), Disp: 90 tablet, Rfl: 1   b complex vitamins capsule, Take 1 capsule by mouth daily., Disp: , Rfl:    Calcium  Carb-Cholecalciferol (CALCIUM -VITAMIN D3) 600-400 MG-UNIT TABS, Take by mouth 2 (two) times daily., Disp: , Rfl:    carbamide peroxide (DEBROX) 6.5 % OTIC solution, Place 5 drops into both ears at bedtime., Disp: 15 mL, Rfl: 0   ezetimibe  (ZETIA ) 10 MG tablet, Take 1  tablet (10 mg total) by mouth daily., Disp: 90 tablet, Rfl: 1   Garlic 1000 MG CAPS, Take 1,000 mg by mouth daily. , Disp: , Rfl:    KRILL OIL OMEGA-3 PO, Take 1 capsule by mouth daily., Disp: , Rfl:    Magnesium  250 MG TABS, Take 1 tablet (250 mg total) by mouth daily., Disp: 90 tablet, Rfl: 1   mometasone  (ELOCON ) 0.1 % cream, Apply to affected area daily, Disp: 45 g, Rfl: 1   olopatadine  (PATADAY ) 0.1 % ophthalmic solution, Place 1 drop into both eyes 2 (two) times daily., Disp: 5 mL, Rfl: 12   omeprazole  (PRILOSEC) 20 MG capsule, Take 1 capsule (20 mg total) by mouth daily., Disp: 30 capsule, Rfl: 1   potassium gluconate 595 MG TABS, Take 595 mg by mouth daily., Disp: , Rfl:    trimethoprim -polymyxin b  (POLYTRIM ) ophthalmic solution, Place 1 drop into both eyes every 6 (six) hours for 5 days., Disp: 10 mL, Rfl: 0   vitamin B-12 (CYANOCOBALAMIN ) 1000 MCG tablet, Take 1,000 mcg by mouth daily., Disp: , Rfl:  [2]  Allergies Allergen Reactions   Tobramycin Itching, Swelling and Rash    Tobramycin eye drops caused swelling, itching, drainage,  rash and peeling of the skin

## 2024-09-11 ENCOUNTER — Ambulatory Visit: Payer: Medicare HMO

## 2024-09-11 ENCOUNTER — Ambulatory Visit: Payer: Self-pay | Admitting: Nurse Practitioner

## 2024-09-11 ENCOUNTER — Encounter: Payer: Self-pay | Admitting: Nurse Practitioner

## 2024-09-11 VITALS — BP 118/60 | HR 72 | Temp 98.5°F | Ht 64.0 in | Wt 148.2 lb

## 2024-09-11 VITALS — BP 138/60 | HR 72 | Temp 98.5°F | Ht 64.0 in | Wt 148.0 lb

## 2024-09-11 DIAGNOSIS — K219 Gastro-esophageal reflux disease without esophagitis: Secondary | ICD-10-CM | POA: Diagnosis not present

## 2024-09-11 DIAGNOSIS — E782 Mixed hyperlipidemia: Secondary | ICD-10-CM | POA: Diagnosis not present

## 2024-09-11 DIAGNOSIS — Z0000 Encounter for general adult medical examination without abnormal findings: Secondary | ICD-10-CM

## 2024-09-11 DIAGNOSIS — I700 Atherosclerosis of aorta: Secondary | ICD-10-CM | POA: Diagnosis not present

## 2024-09-11 DIAGNOSIS — M19072 Primary osteoarthritis, left ankle and foot: Secondary | ICD-10-CM | POA: Diagnosis not present

## 2024-09-11 DIAGNOSIS — M19071 Primary osteoarthritis, right ankle and foot: Secondary | ICD-10-CM

## 2024-09-11 DIAGNOSIS — R7309 Other abnormal glucose: Secondary | ICD-10-CM

## 2024-09-11 DIAGNOSIS — I10 Essential (primary) hypertension: Secondary | ICD-10-CM | POA: Diagnosis not present

## 2024-09-11 DIAGNOSIS — E559 Vitamin D deficiency, unspecified: Secondary | ICD-10-CM

## 2024-09-11 MED ORDER — OMEPRAZOLE 20 MG PO CPDR: 20.0000 mg | DELAYED_RELEASE_CAPSULE | Freq: Every day | ORAL | 1 refills | Status: AC

## 2024-09-11 NOTE — Patient Instructions (Signed)
 Dawn Rodriguez,  Thank you for taking the time for your Medicare Wellness Visit. I appreciate your continued commitment to your health goals. Please review the care plan we discussed, and feel free to reach out if I can assist you further.  Please note that Annual Wellness Visits do not include a physical exam. Some assessments may be limited, especially if the visit was conducted virtually. If needed, we may recommend an in-person follow-up with your provider.  Ongoing Care Seeing your primary care provider every 3 to 6 months helps us  monitor your health and provide consistent, personalized care.   Referrals If a referral was made during today's visit and you haven't received any updates within two weeks, please contact the referred provider directly to check on the status.  Recommended Screenings:  Health Maintenance  Topic Date Due   Medicare Annual Wellness Visit  08/29/2024   DTaP/Tdap/Td vaccine (2 - Td or Tdap) 09/11/2025*   COVID-19 Vaccine (7 - Mixed Product risk 2025-26 season) 02/24/2025   Breast Cancer Screening  05/13/2025   Pneumococcal Vaccine for age over 91  Completed   Flu Shot  Completed   Osteoporosis screening with Bone Density Scan  Completed   Zoster (Shingles) Vaccine  Completed   Meningitis B Vaccine  Aged Out  *Topic was postponed. The date shown is not the original due date.       09/11/2024   10:18 AM  Advanced Directives  Does Patient Have a Medical Advance Directive? No  Would patient like information on creating a medical advance directive? No - Patient declined    Vision: Annual vision screenings are recommended for early detection of glaucoma, cataracts, and diabetic retinopathy. These exams can also reveal signs of chronic conditions such as diabetes and high blood pressure.  Dental: Annual dental screenings help detect early signs of oral cancer, gum disease, and other conditions linked to overall health, including heart disease and  diabetes.  Please see the attached documents for additional preventive care recommendations.

## 2024-09-11 NOTE — Patient Instructions (Addendum)
 You have an appt at [facility] at 2:30pm for an esophageal xray.        Address: [address] [place], [place], [place] [address]       Phone: [phone]  STOP TAKING THE OLMESARTAN  AND ONLY TAKE THE AMLODIPINE  AS LONG AS YOU ARE NOT HAVING ANY SIDE EFFECTS

## 2024-09-11 NOTE — Progress Notes (Signed)
 "  Chief Complaint  Patient presents with   Medicare Wellness     Subjective:   Dawn Dawn is a 89 y.o. female who presents for a Medicare Annual Wellness Visit.  Visit info / Clinical Intake: Medicare Wellness Visit Type:: Subsequent Annual Wellness Visit Persons participating in visit and providing information:: patient Medicare Wellness Visit Mode:: In-person (required for WTM) Interpreter Needed?: No Pre-visit prep was completed: yes AWV questionnaire completed by patient prior to visit?: no Living arrangements:: with family/others Patient's Overall Health Status Rating: very good Typical amount of pain: some Does pain affect daily life?: no Are you currently prescribed opioids?: no  Dietary Habits and Nutritional Risks How many meals a day?: 2 Eats fruit and vegetables daily?: yes Most meals are obtained by: preparing own meals In the last 2 weeks, have you had any of the following?: none Diabetic:: no  Functional Status Activities of Daily Living (to include ambulation/medication): Independent Ambulation: Independent Medication Administration: Independent Home Management (perform basic housework or laundry): Independent Manage your own finances?: yes Primary transportation is: driving Concerns about vision?: no *vision screening is required for WTM* Concerns about hearing?: no  Fall Screening Falls in the past year?: 0 Number of falls in past year: 0 Was there an injury with Fall?: 0 Fall Risk Category Calculator: 0 Patient Fall Risk Level: Low Fall Risk  Fall Risk Patient at Risk for Falls Due to: Medication side effect Fall risk Follow up: Falls prevention discussed; Education provided; Falls evaluation completed  Home and Transportation Safety: All rugs have non-skid backing?: N/A, no rugs All stairs or steps have railings?: yes Grab bars in the bathtub or shower?: (!) no Have non-skid surface in bathtub or shower?: yes Good home lighting?:  yes Regular seat belt use?: yes Hospital stays in the last year:: no  Cognitive Assessment Difficulty concentrating, remembering, or making decisions? : no Will 6CIT or Mini Cog be Completed: yes What year is it?: 0 points What month is it?: 0 points Give patient an address phrase to remember (5 components): [address] [place] About what time is it?: 0 points Count backwards from 20 to 1: 0 points Say the months of the year in reverse: 2 points Repeat the address phrase from earlier: 4 points 6 CIT Score: 6 points  Advance Directives (For Healthcare) Does Patient Have a Medical Advance Directive?: No Would patient like information on creating a medical advance directive?: No - Patient declined  Reviewed/Updated  Reviewed/Updated: Reviewed All (Medical, Surgical, Family, Medications, Allergies, Care Teams, Patient Goals)    Allergies (verified) Tobramycin   Current Medications (verified) Outpatient Encounter Medications as of 09/11/2024  Medication Sig   amLODipine  (NORVASC ) 5 MG tablet TAKE 1 TABLET (5 MG TOTAL) BY MOUTH DAILY.   Ascorbic Acid (VITAMIN C) 500 MG CAPS Take 2 tablets by mouth 2 (two) times daily.   atorvastatin  (LIPITOR) 10 MG tablet TAKE 1 TABLET BY MOUTH EVERY DAY   b complex vitamins capsule Take 1 capsule by mouth daily.   Calcium  Carb-Cholecalciferol (CALCIUM -VITAMIN D3) 600-400 MG-UNIT TABS Take by mouth 2 (two) times daily.   carbamide peroxide (DEBROX) 6.5 % OTIC solution Place 5 drops into both ears at bedtime.   ezetimibe  (ZETIA ) 10 MG tablet Take 1 tablet (10 mg total) by mouth daily.   Garlic 1000 MG CAPS Take 1,000 mg by mouth daily.    KRILL OIL OMEGA-3 PO Take 1 capsule by mouth daily.   Magnesium  250 MG TABS Take 1 tablet (  250 mg total) by mouth daily.   mometasone  (ELOCON ) 0.1 % cream Apply to affected area daily   olopatadine  (PATADAY ) 0.1 % ophthalmic solution Place 1 drop into both eyes 2 (two) times daily.   omeprazole  (PRILOSEC) 20  MG capsule Take 1 capsule (20 mg total) by mouth daily.   potassium gluconate 595 MG TABS Take 595 mg by mouth daily.   vitamin B-12 (CYANOCOBALAMIN ) 1000 MCG tablet Take 1,000 mcg by mouth daily.   atorvastatin  (LIPITOR) 20 MG tablet Take 1 tablet (20 mg total) by mouth daily. (Patient not taking: Reported on 09/11/2024)   olmesartan  (BENICAR ) 20 MG tablet Take 1/2 tablet by mouth daily (Patient not taking: Reported on 09/11/2024)   No facility-administered encounter medications on file as of 09/11/2024.    History: Past Medical History:  Diagnosis Date   Arthritis    Bilateral dry eyes    Cancer (HCC) 02/2018   left breast cancer   Cataracts, bilateral    Chronic headache    GERD (gastroesophageal reflux disease)    Hypertension    Personal history of radiation therapy    TMJ syndrome    Vaginal discharge 11/12/2018   Past Surgical History:  Procedure Laterality Date   BREAST BIOPSY Right 2012   stereotatic biopsy   BREAST BIOPSY Left 03/14/2018   BREAST BIOPSY Right 03/19/2020   BREAST LUMPECTOMY Left 04/06/2018   Procedure: LEFT BREAST LUMPECTOMY;  Surgeon: Mikell Katz, MD;  Location: [facility];  Service: General;  Laterality: Left;   CATARACT EXTRACTION     Family History  Problem Relation Age of Onset   Pneumonia Mother    Breast cancer Neg Hx    Social History   Occupational History   Occupation: retired  Tobacco Use   Smoking status: Never   Smokeless tobacco: Never  Vaping Use   Vaping status: Never Used  Substance and Sexual Activity   Alcohol use: No   Drug use: No   Sexual activity: Not Currently    Birth control/protection: None   Tobacco Counseling Counseling given: Not Answered  SDOH Screenings   Food Insecurity: No Food Insecurity (09/11/2024)  Housing: Unknown (09/11/2024)  Transportation Needs: No Transportation Needs (09/11/2024)  Utilities: Not At Risk (09/11/2024)  Alcohol Screen: Low Risk (09/11/2024)  Depression  (PHQ2-9): Low Risk (09/11/2024)  Financial Resource Strain: Low Risk (09/11/2024)  Physical Activity: Sufficiently Active (09/11/2024)  Social Connections: Moderately Integrated (09/11/2024)  Stress: No Stress Concern Present (09/11/2024)  Tobacco Use: Low Risk (09/11/2024)  Health Literacy: Adequate Health Literacy (09/11/2024)   See flowsheets for full screening details  Depression Screen PHQ 2 & 9 Depression Scale- Over the past 2 weeks, how often have you been bothered by any of the following problems? Little interest or pleasure in doing things: 0 Feeling down, depressed, or hopeless (PHQ Adolescent also includes...irritable): 0 PHQ-2 Total Score: 0 Trouble falling or staying asleep, or sleeping too much: 0 Feeling tired or having little energy: 0 Poor appetite or overeating (PHQ Adolescent also includes...weight loss): 0 Feeling bad about yourself - or that you are a failure or have let yourself or your family down: 0 Trouble concentrating on things, such as reading the newspaper or watching television (PHQ Adolescent also includes...like school work): 0 Moving or speaking so slowly that other people could have noticed. Or the opposite - being so fidgety or restless that you have been moving around a lot more than usual: 0 Thoughts that you would be better off dead,  or of hurting yourself in some way: 0 PHQ-9 Total Score: 0 If you checked off any problems, how difficult have these problems made it for you to do your work, take care of things at home, or get along with other people?: Not difficult at all  Depression Treatment Depression Interventions/Treatment : EYV7-0 Score <4 Follow-up Not Indicated     Goals Addressed               This Visit's Progress     Eat more vegetables and fruits, cut back on pasta (pt-stated)               Objective:    Today's Vitals   09/11/24 1009 09/11/24 1028  BP: 138/60 118/60  Pulse: 72   Temp: 98.5 F (36.9 C)   TempSrc: Oral    Weight: 148 lb 3.2 oz (67.2 kg)   Height: 5' 4 (1.626 m)    Body mass index is 25.44 kg/m.  Hearing/Vision screen Vision Screening - Comments:: Regular eye exams Immunizations and Health Maintenance Health Maintenance  Topic Date Due   DTaP/Tdap/Td (2 - Td or Tdap) 09/11/2025 (Originally 05/18/2023)   COVID-19 Vaccine (7 - Mixed Product risk 2025-26 season) 02/24/2025   Mammogram  05/13/2025   Medicare Annual Wellness (AWV)  09/11/2025   Pneumococcal Vaccine: 50+ Years  Completed   Influenza Vaccine  Completed   Bone Density Scan  Completed   Zoster Vaccines- Shingrix   Completed   Meningococcal B Vaccine  Aged Out        Assessment/Plan:  This is a routine wellness examination for Dawn Dawn.  Patient Care Team: Georgina Speaks, FNP as PCP - General (General Practice) Odean Potts, MD as Consulting Physician (Hematology and Oncology) Kristie Lamprey, MD as Consulting Physician (Gastroenterology) Octavia Bruckner, MD as Consulting Physician (Ophthalmology)  I have personally reviewed and noted the following in the patients chart:   Medical and social history Use of alcohol, tobacco or illicit drugs  Current medications and supplements including opioid prescriptions. Functional ability and status Nutritional status Physical activity Advanced directives List of other physicians Hospitalizations, surgeries, and ER visits in previous 12 months Vitals Screenings to include cognitive, depression, and falls Referrals and appointments  No orders of the defined types were placed in this encounter.  In addition, I have reviewed and discussed with patient certain preventive protocols, quality metrics, and best practice recommendations. A written personalized care plan for preventive services as well as general preventive health recommendations were provided to patient.   Dawn FORBES Dawn, LPN   8/78/7973   Return in 1 year (on 09/11/2025).  After Visit Summary: (In  Person-Printed) AVS printed and given to the patient  Nurse Notes: No voiced or noted concerns at this time  "

## 2024-09-12 ENCOUNTER — Ambulatory Visit: Payer: Self-pay | Admitting: Nurse Practitioner

## 2024-09-12 ENCOUNTER — Other Ambulatory Visit: Payer: Self-pay | Admitting: Nurse Practitioner

## 2024-09-12 ENCOUNTER — Ambulatory Visit (HOSPITAL_COMMUNITY)
Admission: EM | Admit: 2024-09-12 | Discharge: 2024-09-12 | Disposition: A | Discharge status: Home / Self Care | Attending: Emergency Medicine | Admitting: Emergency Medicine

## 2024-09-12 ENCOUNTER — Encounter (HOSPITAL_COMMUNITY): Payer: Self-pay

## 2024-09-12 ENCOUNTER — Inpatient Hospital Stay: Admission: RE | Admit: 2024-09-12 | Source: Ambulatory Visit

## 2024-09-12 DIAGNOSIS — H1033 Unspecified acute conjunctivitis, bilateral: Secondary | ICD-10-CM

## 2024-09-12 DIAGNOSIS — E782 Mixed hyperlipidemia: Secondary | ICD-10-CM

## 2024-09-12 LAB — BMP8+EGFR
BUN/Creatinine Ratio: 12 (ref 12–28)
BUN: 10 mg/dL (ref 8–27)
CO2: 23 mmol/L (ref 20–29)
Calcium: 9.6 mg/dL (ref 8.7–10.3)
Chloride: 104 mmol/L (ref 96–106)
Creatinine, Ser: 0.82 mg/dL (ref 0.57–1.00)
Glucose: 97 mg/dL (ref 70–99)
Potassium: 4.1 mmol/L (ref 3.5–5.2)
Sodium: 143 mmol/L (ref 134–144)
eGFR: 69 mL/min/1.73

## 2024-09-12 LAB — LIPID PANEL
Chol/HDL Ratio: 3.3 ratio (ref 0.0–4.4)
Cholesterol, Total: 214 mg/dL — ABNORMAL HIGH (ref 100–199)
HDL: 65 mg/dL
LDL Chol Calc (NIH): 141 mg/dL — ABNORMAL HIGH (ref 0–99)
Triglycerides: 45 mg/dL (ref 0–149)
VLDL Cholesterol Cal: 8 mg/dL (ref 5–40)

## 2024-09-12 MED ORDER — POLYMYXIN B-TRIMETHOPRIM 10000-0.1 UNIT/ML-% OP SOLN: 1.0000 [drp] | Freq: Four times a day (QID) | OPHTHALMIC | 0 refills | Status: AC

## 2024-09-12 NOTE — Discharge Instructions (Addendum)
 Use the eye drops 4 times daily for the next 5 days Use them in both eyes Wash your hands before and after application Wipe down high touch surfaces such as counters, knobs in the bathroom  Symptoms should improve over the next few days.  For any improvement or any changes seek follow-up care

## 2024-09-12 NOTE — ED Provider Notes (Signed)
 " [facility]    CSN: [id number] Arrival date & time: 09/12/24  0848      History   Chief Complaint Chief Complaint  Patient presents with   Conjunctivitis    HPI Dawn Rodriguez is a 89 y.o. female.   Patient presents to clinic over concern of right eye redness, itching and drainage since yesterday  Granddaughter came over d/t issues with hot water at her house  She was unaware her granddaughter had pink eye Yesterday patient had bilateral itchy eyes with redness and crusting drainage  Does use drops for dry eyes and despite using her eye drops eyes have gotten more pink  Denies eye pain or changes in vision   The history is provided by the patient and medical records.  Conjunctivitis    Past Medical History:  Diagnosis Date   Arthritis    Bilateral dry eyes    Cancer (HCC) 02/2018   left breast cancer   Cataracts, bilateral    Chronic headache    GERD (gastroesophageal reflux disease)    Hypertension    Personal history of radiation therapy    TMJ syndrome    Vaginal discharge 11/12/2018    Patient Active Problem List   Diagnosis Date Noted   Need for influenza vaccination 06/19/2024   Bilateral carpal tunnel syndrome 05/08/2024   Acute vaginitis 02/25/2024   Hematuria 11/19/2023   Need for zoster vaccination 11/06/2023   Encounter for annual health examination 11/06/2023   Influenza vaccination declined 08/30/2023   COVID-19 vaccination declined 08/30/2023   Atherosclerosis of aorta 08/30/2023   Tingling sensation 08/30/2023   History of breast cancer 08/30/2023   Osteopenia of multiple sites 08/30/2023   Bumps on skin 05/10/2023   Dysuria 05/10/2023   Rectal pain 05/10/2023   Leukocytes in urine 05/10/2023   Herpes zoster vaccination declined 02/06/2023   Vitamin D  deficiency 02/06/2023   Decreased estrogen level 02/06/2023   Anxiety 11/04/2022   Abnormal glucose 11/04/2022   Mixed hyperlipidemia 11/04/2022   Essential  hypertension 11/04/2022   Encounter for Papanicolaou smear of cervix 11/12/2018   Cataracts, bilateral    GERD (gastroesophageal reflux disease)    Osteoarthritis 10/04/2007   Internal hemorrhoids 09/14/2007   Diverticulosis of colon 09/14/2007    Past Surgical History:  Procedure Laterality Date   BREAST BIOPSY Right 2012   stereotatic biopsy   BREAST BIOPSY Left 03/14/2018   BREAST BIOPSY Right 03/19/2020   BREAST LUMPECTOMY Left 04/06/2018   Procedure: LEFT BREAST LUMPECTOMY;  Surgeon: Mikell Katz, MD;  Location: [facility];  Service: General;  Laterality: Left;   CATARACT EXTRACTION      OB History   No obstetric history on file.      Home Medications    Prior to Admission medications  Medication Sig Start Date End Date Taking? Authorizing Provider  trimethoprim -polymyxin b  (POLYTRIM ) ophthalmic solution Place 1 drop into both eyes every 6 (six) hours for 5 days. 09/12/24 09/17/24 Yes Ball, Adrik Khim  G, FNP  amLODipine  (NORVASC ) 5 MG tablet TAKE 1 TABLET (5 MG TOTAL) BY MOUTH DAILY. 05/30/24   Georgina Speaks, FNP  Ascorbic Acid (VITAMIN C) 500 MG CAPS Take 2 tablets by mouth 2 (two) times daily.    [provider]  atorvastatin  (LIPITOR) 20 MG tablet Take 1 tablet (20 mg total) by mouth daily. Patient not taking: Reported on 09/11/2024 06/06/24   Moore, Janece, FNP  b complex vitamins capsule Take 1 capsule by mouth daily.  [provider]  Calcium  Carb-Cholecalciferol (CALCIUM -VITAMIN D3) 600-400 MG-UNIT TABS Take by mouth 2 (two) times daily.    [provider]  carbamide peroxide (DEBROX) 6.5 % OTIC solution Place 5 drops into both ears at bedtime. 11/22/23   Ball, Lameka Disla  G, FNP  ezetimibe  (ZETIA ) 10 MG tablet Take 1 tablet (10 mg total) by mouth daily. 06/12/24   Georgina Speaks, FNP  Garlic 1000 MG CAPS Take 1,000 mg by mouth daily.     [provider]  KRILL OIL OMEGA-3 PO Take 1 capsule by mouth daily.    [provider]  Magnesium  250 MG TABS Take 1 tablet (250 mg total) by mouth daily. 11/04/22   Georgina Speaks, FNP  mometasone  (ELOCON ) 0.1 % cream Apply to affected area daily 05/08/24   Georgina Speaks, FNP  olopatadine  (PATADAY ) 0.1 % ophthalmic solution Place 1 drop into both eyes 2 (two) times daily. 12/31/20   Gudena, Vinay, MD  omeprazole  (PRILOSEC) 20 MG capsule Take 1 capsule (20 mg total) by mouth daily. 09/11/24   Georgina Speaks, FNP  potassium gluconate 595 MG TABS Take 595 mg by mouth daily.    [provider]  vitamin B-12 (CYANOCOBALAMIN ) 1000 MCG tablet Take 1,000 mcg by mouth daily.    [provider]    Family History Family History  Problem Relation Age of Onset   Pneumonia Mother    Breast cancer Neg Hx     Social History Social History[1]   Allergies   Tobramycin   Review of Systems Review of Systems  Per HPI  Physical Exam Triage Vital Signs ED Triage Vitals  Encounter Vitals Group     BP 09/12/24 0929 (!) 144/76     Girls Systolic BP Percentile --      Girls Diastolic BP Percentile --      Boys Systolic BP Percentile --      Boys Diastolic BP Percentile --      Pulse Rate 09/12/24 0929 69     Resp 09/12/24 0929 18     Temp 09/12/24 0929 98.4 F (36.9 C)     Temp Source 09/12/24 0929 Oral     SpO2 09/12/24 0929 94 %     Weight --      Height --      Head Circumference --      Peak Flow --      Pain Score 09/12/24 0928 5     Pain Loc --      Pain Education --      Exclude from Growth Chart --    No data found.  Updated Vital Signs BP (!) 144/76 (BP Location: Right Arm)   Pulse 69   Temp 98.4 F (36.9 C) (Oral)   Resp 18   SpO2 94%   Visual Acuity Right Eye Distance:   Left Eye Distance:   Bilateral Distance:    Right Eye Near:   Left Eye Near:    Bilateral Near:     Physical Exam Vitals and nursing note reviewed.  Constitutional:      Appearance: Normal appearance.  HENT:     Head: Normocephalic and atraumatic.      Right Ear: External ear normal.     Left Ear: External ear normal.     Nose: Nose normal.     Mouth/Throat:     Mouth: Mucous membranes are moist.  Eyes:     General:        Right eye: Discharge  present.     Extraocular Movements: Extraocular movements intact.     Conjunctiva/sclera:     Right eye: Right conjunctiva is injected.     Pupils: Pupils are equal, round, and reactive to light.  Cardiovascular:     Rate and Rhythm: Normal rate.  Pulmonary:     Effort: Pulmonary effort is normal. No respiratory distress.  Skin:    General: Skin is warm and dry.  Neurological:     General: No focal deficit present.     Mental Status: She is alert.  Psychiatric:        Mood and Affect: Mood normal.      UC Treatments / Results  Labs (all labs ordered are listed, but only abnormal results are displayed) Labs Reviewed - No data to display  EKG   Radiology No results found.  Procedures Procedures (including critical care time)  Medications Ordered in UC Medications - No data to display  Initial Impression / Assessment and Plan / UC Course  I have reviewed the triage vital signs and the nursing notes.  Pertinent labs & imaging results that were available during my care of the patient were reviewed by me and considered in my medical decision making (see chart for details).  Vitals and triage reviewed, patient is hemodynamically stable.  Right conjunctivitis injected with scant purulent discharge dried to the lashes.  PERRLA.  Extraocular movements intact.  Afebrile without warmth or swelling around the eye, without clinical evidence of orbital or periorbital cellulitis.  Will treat with Polytrim  over concern of bacterial conjunctivitis.  Plan of care, follow-up care, and return precautions given, no questions at this time.    Final Clinical Impressions(s) / UC Diagnoses   Final diagnoses:  Acute conjunctivitis of both eyes, unspecified acute conjunctivitis type      Discharge Instructions      Use the eye drops 4 times daily for the next 5 days Use them in both eyes Wash your hands before and after application Wipe down high touch surfaces such as counters, knobs in the bathroom  Symptoms should improve over the next few days.  For any improvement or any changes seek follow-up care      ED Prescriptions     Medication Sig Dispense Auth. Provider   trimethoprim -polymyxin b  (POLYTRIM ) ophthalmic solution Place 1 drop into both eyes every 6 (six) hours for 5 days. 10 mL Ball, Conchetta Lamia  G, FNP      PDMP not reviewed this encounter.     [1]  Social History Tobacco Use   Smoking status: Never   Smokeless tobacco: Never  Vaping Use   Vaping status: Never Used  Substance Use Topics   Alcohol use: No   Drug use: No     Mercer Miyoshi Ligas  G, FNP 09/12/24 1003  "

## 2024-09-12 NOTE — ED Triage Notes (Signed)
 Pt c/o both eyes itchy and drainage since yesterday. States her granddaughter has pink eye. States used her dry eye drops with relief.

## 2024-09-17 NOTE — Assessment & Plan Note (Signed)
 Cholesterol elevated. Atorvastatin  and ezetimibe  tolerated without side effects. - Continue atorvastatin  as prescribed. - Continue ezetimibe  10 mg daily.

## 2024-09-17 NOTE — Assessment & Plan Note (Addendum)
 Will check vitamin D  level and supplement as needed.    Also encouraged to spend 15 minutes in the sun daily.

## 2024-09-17 NOTE — Assessment & Plan Note (Signed)
 A1c monitoring required for glucose control assessment. - Ordered A1c test.

## 2024-09-17 NOTE — Assessment & Plan Note (Signed)
 Blood pressure controlled with amlodipine . Previous side effects resolved. - Continue amlodipine  5 mg daily only, I have discontinued her olmesartan 

## 2024-09-17 NOTE — Assessment & Plan Note (Signed)
 Reflux symptoms returned, likely due to diet. Omeprazole  needed again. - Refilled omeprazole  temporarily. - Advised dietary modifications to reduce intake of greasy and fried foods. - Encouraged increased consumption of vegetables and fruits.

## 2024-09-17 NOTE — Assessment & Plan Note (Addendum)
 Continue statin  Continue statin

## 2024-09-17 NOTE — Assessment & Plan Note (Signed)
 Pain and swelling improving with topical cream and vitamins. - Continue using topical cream as prescribed. - Continue vitamin B complex and vitamin C supplementation.

## 2024-10-24 ENCOUNTER — Other Ambulatory Visit

## 2024-11-07 ENCOUNTER — Encounter: Payer: Self-pay | Admitting: Nurse Practitioner

## 2025-09-17 ENCOUNTER — Ambulatory Visit: Payer: Self-pay

## 2025-09-17 ENCOUNTER — Ambulatory Visit: Payer: Self-pay | Admitting: Nurse Practitioner
